# Patient Record
Sex: Male | Born: 1975 | Race: White | Hispanic: No | Marital: Single | Smoking: Current every day smoker
Health system: Southern US, Community
[De-identification: ages and names within clinical notes are randomized; demographics above are authoritative.]

## PROBLEM LIST (undated history)

## (undated) ENCOUNTER — Encounter (HOSPITAL_COMMUNITY): Payer: Self-pay

## (undated) ENCOUNTER — Ambulatory Visit (HOSPITAL_COMMUNITY): Payer: Self-pay | Admitting: Otolaryngology

## (undated) ENCOUNTER — Encounter (HOSPITAL_COMMUNITY): Admission: RE | Payer: Self-pay | Source: Ambulatory Visit

## (undated) DIAGNOSIS — S069X9A Unspecified intracranial injury with loss of consciousness of unspecified duration, initial encounter: Secondary | ICD-10-CM

## (undated) DIAGNOSIS — I10 Essential (primary) hypertension: Secondary | ICD-10-CM

## (undated) DIAGNOSIS — E785 Hyperlipidemia, unspecified: Secondary | ICD-10-CM

## (undated) DIAGNOSIS — E119 Type 2 diabetes mellitus without complications: Secondary | ICD-10-CM

## (undated) DIAGNOSIS — K219 Gastro-esophageal reflux disease without esophagitis: Secondary | ICD-10-CM

## (undated) DIAGNOSIS — T148XXD Other injury of unspecified body region, subsequent encounter: Secondary | ICD-10-CM

## (undated) DIAGNOSIS — T148XXA Other injury of unspecified body region, initial encounter: Secondary | ICD-10-CM

## (undated) DIAGNOSIS — B1920 Unspecified viral hepatitis C without hepatic coma: Secondary | ICD-10-CM

## (undated) DIAGNOSIS — F419 Anxiety disorder, unspecified: Secondary | ICD-10-CM

## (undated) DIAGNOSIS — L818 Other specified disorders of pigmentation: Secondary | ICD-10-CM

## (undated) DIAGNOSIS — Z720 Tobacco use: Secondary | ICD-10-CM

## (undated) DIAGNOSIS — D649 Anemia, unspecified: Secondary | ICD-10-CM

## (undated) DIAGNOSIS — E669 Obesity, unspecified: Secondary | ICD-10-CM

## (undated) DIAGNOSIS — M109 Gout, unspecified: Secondary | ICD-10-CM

## (undated) HISTORY — PX: HX UPPER ENDOSCOPY: 2100001144

## (undated) HISTORY — PX: HX TONSILLECTOMY: SHX27

## (undated) HISTORY — PX: HX OTHER: 2100001105

## (undated) HISTORY — PX: ANKLE FRACTURE SURGERY: SHX122

## (undated) HISTORY — PX: TONSILLECTOMY: SUR1361

## (undated) HISTORY — PX: NASAL SEPTUM SURGERY: SHX37

## (undated) HISTORY — PX: APPENDECTOMY: SHX54

## (undated) SURGERY — PLACEMENT SPLINT NASAL
Anesthesia: General | Site: Nose | Laterality: Bilateral

---

## 2003-03-09 ENCOUNTER — Emergency Department (HOSPITAL_COMMUNITY): Payer: Self-pay

## 2013-01-06 ENCOUNTER — Ambulatory Visit (HOSPITAL_BASED_OUTPATIENT_CLINIC_OR_DEPARTMENT_OTHER)
Admission: RE | Admit: 2013-01-06 | Discharge: 2013-01-06 | Disposition: A | Discharge status: Home / Self Care | Payer: MEDICAID | Source: Ambulatory Visit | Attending: Nurse Practitioner-BA | Admitting: Nurse Practitioner-BA

## 2013-01-06 ENCOUNTER — Ambulatory Visit (HOSPITAL_BASED_OUTPATIENT_CLINIC_OR_DEPARTMENT_OTHER): Payer: MEDICAID | Admitting: Otolaryngology

## 2013-01-06 ENCOUNTER — Encounter (INDEPENDENT_AMBULATORY_CARE_PROVIDER_SITE_OTHER): Payer: Self-pay | Admitting: Otolaryngology

## 2013-01-06 ENCOUNTER — Encounter (HOSPITAL_COMMUNITY): Payer: Self-pay

## 2013-01-06 ENCOUNTER — Ambulatory Visit
Admission: RE | Admit: 2013-01-06 | Discharge: 2013-01-06 | Disposition: A | Discharge status: Home / Self Care | Payer: MEDICAID | Source: Ambulatory Visit | Attending: Otolaryngology | Admitting: Otolaryngology

## 2013-01-06 VITALS — BP 110/80 | Temp 98.6°F | Ht 74.0 in | Wt 272.9 lb

## 2013-01-06 VITALS — BP 122/77 | HR 98 | Temp 97.3°F | Resp 18 | Ht 74.02 in | Wt 274.5 lb

## 2013-01-06 DIAGNOSIS — F17200 Nicotine dependence, unspecified, uncomplicated: Secondary | ICD-10-CM | POA: Insufficient documentation

## 2013-01-06 DIAGNOSIS — J3489 Other specified disorders of nose and nasal sinuses: Secondary | ICD-10-CM

## 2013-01-06 DIAGNOSIS — B1920 Unspecified viral hepatitis C without hepatic coma: Secondary | ICD-10-CM | POA: Insufficient documentation

## 2013-01-06 HISTORY — DX: Other injury of unspecified body region, initial encounter: T14.8XXA

## 2013-01-06 HISTORY — DX: Unspecified viral hepatitis C without hepatic coma: B19.20

## 2013-01-06 HISTORY — DX: Gastro-esophageal reflux disease without esophagitis: K21.9

## 2013-01-06 HISTORY — DX: Other specified disorders of pigmentation: L81.8

## 2013-01-06 HISTORY — DX: Anxiety disorder, unspecified: F41.9

## 2013-01-06 HISTORY — DX: Obesity, unspecified: E66.9

## 2013-01-06 HISTORY — DX: Essential (primary) hypertension: I10

## 2013-01-06 LAB — BASIC METABOLIC PANEL
ANION GAP: 5 mmol/L (ref 5–16)
BUN/CREAT RATIO: 10 (ref 6–22)
BUN: 15 mg/dL (ref 8–26)
CALCIUM: 8.8 mg/dL (ref 8.5–10.4)
CARBON DIOXIDE: 25 mmol/L (ref 23–33)
CHLORIDE: 99 mmol/L (ref 96–111)
CREATININE: 1.45 mg/dL — ABNORMAL HIGH (ref 0.62–1.27)
ESTIMATED GLOMERULAR FILTRATION RATE: 55 mL/min/{1.73_m2} — ABNORMAL LOW (ref >59)
GLUCOSE,NONFAST: 95 mg/dL (ref 65–139)
POTASSIUM: 4.3 mmol/L (ref 3.5–5.1)
SODIUM: 132 mmol/L — ABNORMAL LOW (ref 136–145)

## 2013-01-06 LAB — PERFORM POC WHOLE BLOOD GLUCOSE: GLUCOSE, POINT OF CARE: 150 mg/dL — ABNORMAL HIGH (ref 70–105)

## 2013-01-06 LAB — CBC
HCT: 35.8 % — ABNORMAL LOW (ref 36.7–47.0)
HGB: 12.3 g/dL — ABNORMAL LOW (ref 12.5–16.3)
MCH: 34.4 pg — ABNORMAL HIGH (ref 27.4–33.0)
MCHC: 34.5 g/dL (ref 32.5–35.8)
MCV: 99.8 fL (ref 78–100)
MPV: 8.4 fL (ref 7.5–11.5)
PLATELET COUNT: 242 THOU/uL (ref 140–450)
RBC: 3.58 MIL/uL — ABNORMAL LOW (ref 4.06–5.63)
RDW: 14 % (ref 12.0–15.0)
WBC: 4.3 10*3/uL (ref 3.5–11.0)

## 2013-01-06 NOTE — Anesthesia Preprocedure Evaluation (Addendum)
Airway       Mallampati: I    TM distance: >3 FB    Neck ROM: full  Mouth Opening: good.  No Facial hair  No Beard  No endotracheal tube present  No Tracheostomy present    Dental           (+) poor dentition           Pulmonary    Breath sounds clear to auscultation  (-) no rhonchi, no decreased breath sounds, no wheezes and no rales     Cardiovascular    Rhythm: regular  Rate: Normal  (-) no peripheral edema and no murmur     Other findings                Planned anesthesia type: general  ASA 3        Patient's NPO status is appropriate for Anesthesia.      Anesthetic plan and risks discussed with patient.    Anesthesia issues/risks discussed are: PONV, Post-op Pain Management, Dental Injuries and Post-op Agitation/Tantrum.          Plan discussed with CRNA.                      EKG Ordered:  01/06/2013   CXR Ordered:  01/06/2013   Other Studies: 01/06/2013  Lab results from outside facility     Consults: None    STOP BANG Score (0-8): 3    Patient instructed to take the following medications day of surgery,  Dexilant, Zantac, Neurontin, Ativan, Incivek, Ribarinin,.

## 2013-01-08 NOTE — H&P (Addendum)
 [facility]                              DEPARTMENT OF [place], [place] [address]                                PATIENT NAME: Juan George, Juan George [facility] [id number]  DATE OF SERVICE:01/06/2013  DATE OF BIRTH: [birth year]    HISTORY AND PHYSICAL    PRIMARY CARE PROVIDER:  Salomon Rosella, Juan George.    HISTORY OF PRESENT ILLNESS:  This is a 37 year old male who presents for further evaluation of a possible septal perforation.  He states that 6-8 years ago he was a heavy cocaine user.  He did go on a binge for about 2-3 months at that time.  Since then, he has had a sore spot in the left side of his nose.  He states that crusting would continue to build up on there.  He would take it out, and the ulcer would be irritated and bleeding somewhat.  He has had no whistling.  He did not have health insurance and subsequently could not see a specialist until he got Medicaid.  He presents here for further evaluation of this septal perforation/ulcer.  Of note, the patient does have hepatitis C.    Past Medical History   Diagnosis Date   . Hypertension    . Anxiety    . GERD (gastroesophageal reflux disease)      Well controlled with medication   . Viral hepatitis C      Tests negative, but still on medications   . Obesity    . Piercing      ears   . Decorative tattoo      several over body       PAST SURGICAL HISTORY:    Past Surgical History   Procedure Laterality Date   . Hx upper endoscopy     . Hx other       Right ankle ORIF, hardware removed   . Hx tonsillectomy          FAMILY HISTORY:   Family History   Problem Relation Age of Onset   . Anesth Problems Neg Hx    . Bleeding Prob Neg Hx    . Cancer Neg Hx    . Diabetes Neg Hx    . Heart Disease Neg Hx    . Hypertension Neg Hx    . Other Neg Hx        SOCIAL HISTORY:    History   Substance Use Topics   . Smoking status: Current Every Day Smoker -- 0.50 packs/day for 20 years   .  Smokeless tobacco: Never Used   . Alcohol Use: No       MEDICATIONS:    Current Outpatient Prescriptions   Medication Sig   . DEXLANSOPRAZOLE (DEXILANT ORAL) Take by mouth   . Gabapentin (NEURONTIN) 600 mg Oral Tablet Take 600 mg by mouth   . INTERFERON ALFA-N3 INJ by Injection route Every 7 days   . Lisinopril -Hydrochlorothiazide  (ZESTORETIC) 20-25 mg Oral Tablet Take 1 Tab by mouth Once a day   . LORAZEPAM (ATIVAN ORAL) Take by  mouth   . RANITIDINE HCL (ACID REDUCER, RANITIDINE, ORAL) Take by mouth   . RIBAVIRIN ORAL Take by mouth Twice daily   . telaprevir (INCIVEK) 375 mg Oral Tablet Take 1,125 mg by mouth Twice daily   . TRAMADOL  HCL (TRAMADOL  ORAL) Take by mouth       ALLERGIES:  No Known Allergies    REVIEW OF SYSTEMS:  Denies fevers or chills.  All other systems were reviewed and were found to be negative.      PHYSICAL EXAMINATION:  Vital signs:  Blood pressure is 110/80, temperature 98.6 degrees Fahrenheit, weight 123.8 kg, height 188.0 cm.  General appearance:  Pleasant, cooperative, healthy and in no acute distress.  Eyes:  Conjunctivae/corneas clear, PERRLA, EOMs intact.  Head and face:  Normocephalic, atraumatic.  Face symmetric, no obvious lesions.   Pinnae:  Normal shape and position.   External auditory canals:  Patent without inflammation.  Tympanic membranes:  Intact, translucent, midposition, middle ear aerated.  Nose:  External pyramid is midline.  When looking inside of the nose on anterior rhinoscopy, there is a large left septal ulcer with crusting.  There is no mucosa on that side, and there is no cartilage on that side.  However, it has not gone through-and-through, and there is still mucosa on the right side of the septum.  There is a right septal deviation and a right inferior septal spur.  Oral cavity/oropharynx:  No mucosal lesions, masses or pharyngeal asymmetry.  Neck:  No palpable thyroid , salivary gland or neck masses.  Hem/lymph:  No cervical adenopathy.  Cardiovascular:  Good  perfusion of upper extremities. No cyanosis of the hands or fingers.  Lungs:  No apparent stridulous breathing.  In no acute distress.  Skin:  Warm and dry.  Neurologic:  Cranial nerves grossly intact.  Psychiatric:  Alert and oriented x 3.    ASSESSMENT:  1.  Previous cocaine user.  2.  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    PLAN:  1.  Will schedule for an open rhinoplasty, repair of septal perforation and possible ear cartilage graft.  2.  He is to see his doctor to see if he can undergo surgery even with his concurrent hepatitis C treatment.  3.  The patient understands the risks and benefits of the procedure and wishes to proceed.  Consent is signed at this visit.  4.  Will follow up at the time of surgery.  5.  Outside records are scanned into [facility].      Juan SHAUNNA Buoy, Juan George  Resident  [place] Department of Otolaryngology    Juan Liverpool, Juan George  Professor  [facility] Department of Otolaryngology    [id number]; D: 01/08/2013 10:05:25; T: 01/08/2013 10:24:23    cc: Juan Rosella Juan George      [facility] [address] [place], [place] [address]    I saw and examined the patient.  I reviewed the resident's note.  I agree with the findings and plan of care as documented in the resident's note.  Any exceptions/additions are edited/noted.    Juan Liverpool, Juan George 01/09/2013, 8:21 PM

## 2013-02-15 ENCOUNTER — Encounter (HOSPITAL_COMMUNITY): Payer: Self-pay

## 2013-02-15 ENCOUNTER — Ambulatory Visit (HOSPITAL_BASED_OUTPATIENT_CLINIC_OR_DEPARTMENT_OTHER): Payer: MEDICAID | Admitting: Otolaryngology

## 2013-02-15 ENCOUNTER — Ambulatory Visit (HOSPITAL_BASED_OUTPATIENT_CLINIC_OR_DEPARTMENT_OTHER): Payer: MEDICAID | Admitting: Anesthesiology

## 2013-02-15 ENCOUNTER — Inpatient Hospital Stay
Admission: RE | Admit: 2013-02-15 | Discharge: 2013-02-15 | Disposition: A | Discharge status: Home / Self Care | Payer: MEDICAID | Source: Ambulatory Visit | Attending: Otolaryngology | Admitting: Otolaryngology

## 2013-02-15 ENCOUNTER — Encounter (HOSPITAL_COMMUNITY): Payer: Self-pay | Admitting: Nurse Practitioner-BA

## 2013-02-15 ENCOUNTER — Encounter (HOSPITAL_COMMUNITY)
Admission: RE | Disposition: A | Discharge status: Home / Self Care | Payer: Self-pay | Source: Ambulatory Visit | Attending: Otolaryngology

## 2013-02-15 DIAGNOSIS — J3489 Other specified disorders of nose and nasal sinuses: Secondary | ICD-10-CM

## 2013-02-15 HISTORY — PX: PB REPAIR NASAL SEPTUM PERFORATN: 30630

## 2013-02-15 HISTORY — PX: PB EAR CARTILAGE GRAFT TO FACE: 21235

## 2013-02-15 SURGERY — REPAIR SEPTAL PERFORATION
Anesthesia: General | Site: Ear | Wound class: Clean Contaminated Wounds-The respiratory, GI, Genital, or urinary

## 2013-02-15 MED ORDER — LACTATED RINGERS INTRAVENOUS SOLUTION
INTRAVENOUS | Status: DC | PRN
Start: 2013-02-15 — End: 2013-02-15

## 2013-02-15 MED ORDER — LIDOCAINE (PF) 100 MG/5 ML (2 %) INTRAVENOUS SYRINGE
INJECTION | Freq: Once | INTRAVENOUS | Status: DC | PRN
Start: 2013-02-15 — End: 2013-02-15
  Administered 2013-02-15: 80 mg via INTRAVENOUS
  Administered 2013-02-15: 100 mg via INTRAVENOUS

## 2013-02-15 MED ORDER — PHENYLEPHRINE 120 MCG/ML IV DILUTION
Freq: Once | INTRAMUSCULAR | Status: DC | PRN
Start: 2013-02-15 — End: 2013-02-15
  Administered 2013-02-15: 100 ug via INTRAVENOUS
  Administered 2013-02-15: 50 ug via INTRAVENOUS
  Administered 2013-02-15 (×2): 100 ug via INTRAVENOUS

## 2013-02-15 MED ORDER — HYDROCODONE 10 MG-ACETAMINOPHEN 325 MG TABLET
1.00 | ORAL_TABLET | ORAL | Status: DC | PRN
Start: 2013-02-15 — End: 2013-03-03

## 2013-02-15 MED ORDER — SODIUM CHLORIDE 0.9 % (FLUSH) INJECTION SYRINGE
2.00 mL | INJECTION | Freq: Three times a day (TID) | INTRAMUSCULAR | Status: DC
Start: 2013-02-15 — End: 2013-02-16

## 2013-02-15 MED ORDER — HYDROMORPHONE 1 MG/ML INJECTION WRAPPER
0.6000 mg | INJECTION | INTRAMUSCULAR | Status: DC | PRN
Start: 2013-02-15 — End: 2013-02-16
  Administered 2013-02-15 (×3): 0.6 mg via INTRAVENOUS
  Administered 2013-02-15: 0.4 mg via INTRAVENOUS
  Administered 2013-02-15 (×3): 0.6 mg via INTRAVENOUS
  Filled 2013-02-15: qty 1
  Filled 2013-02-15 (×2): qty 2

## 2013-02-15 MED ORDER — SODIUM CHLORIDE 0.9 % (FLUSH) INJECTION SYRINGE
2.00 mL | INJECTION | INTRAMUSCULAR | Status: DC | PRN
Start: 2013-02-15 — End: 2013-02-16

## 2013-02-15 MED ORDER — OXYMETAZOLINE 0.05 % NASAL SPRAY
1.0000 | Freq: Once | NASAL | Status: DC | PRN
Start: 2013-02-15 — End: 2013-02-15
  Administered 2013-02-15: 10 mL via TOPICAL
  Filled 2013-02-15: qty 30

## 2013-02-15 MED ORDER — ONDANSETRON HCL (PF) 4 MG/2 ML INJECTION SOLUTION
4.0000 mg | Freq: Once | INTRAMUSCULAR | Status: DC | PRN
Start: 2013-02-15 — End: 2013-02-16
  Administered 2013-02-15: 4 mg via INTRAVENOUS
  Filled 2013-02-15 (×2): qty 2

## 2013-02-15 MED ORDER — HYDROCODONE 10 MG-ACETAMINOPHEN 325 MG TABLET
1.0000 | ORAL_TABLET | ORAL | Status: DC | PRN
Start: 2013-02-15 — End: 2013-02-16
  Filled 2013-02-15 (×2): qty 1

## 2013-02-15 MED ORDER — MIDAZOLAM 1 MG/ML INJECTION SOLUTION
Freq: Once | INTRAMUSCULAR | Status: DC | PRN
Start: 2013-02-15 — End: 2013-02-15
  Administered 2013-02-15: 2 mg via INTRAVENOUS

## 2013-02-15 MED ORDER — PHENYLEPHRINE 10 MG IN NS 100 ML INFUSION - FOR ANES
INTRAVENOUS | Status: DC | PRN
Start: 2013-02-15 — End: 2013-02-15
  Administered 2013-02-15: .1 ug/kg/min via INTRAVENOUS
  Administered 2013-02-15: 0 ug/kg/min via INTRAVENOUS

## 2013-02-15 MED ORDER — ONDANSETRON HCL (PF) 4 MG/2 ML INJECTION SOLUTION
Freq: Once | INTRAMUSCULAR | Status: DC | PRN
Start: 2013-02-15 — End: 2013-02-15
  Administered 2013-02-15: 4 mg via INTRAVENOUS

## 2013-02-15 MED ORDER — PROPOFOL 10 MG/ML IV BOLUS
INJECTION | Freq: Once | INTRAVENOUS | Status: DC | PRN
Start: 2013-02-15 — End: 2013-02-15
  Administered 2013-02-15: 200 mg via INTRAVENOUS

## 2013-02-15 MED ORDER — DEXAMETHASONE SODIUM PHOSPHATE 4 MG/ML INJECTION SOLUTION
Freq: Once | INTRAMUSCULAR | Status: DC | PRN
Start: 2013-02-15 — End: 2013-02-15
  Administered 2013-02-15: 10 mg via INTRAVENOUS

## 2013-02-15 MED ORDER — NEOMYCIN-POLYMYXIN-PRAMOXINE 3.5 MG-10,000 UNIT-10 MG/GRAM TOP CREAM
TOPICAL_CREAM | Freq: Once | CUTANEOUS | Status: DC | PRN
Start: 2013-02-15 — End: 2013-02-15
  Filled 2013-02-15: qty 15

## 2013-02-15 MED ORDER — LIDOCAINE 1 %-EPINEPHRINE 1:100,000 INJECTION SOLUTION
5.0000 mL | Freq: Once | INTRAMUSCULAR | Status: DC | PRN
Start: 2013-02-15 — End: 2013-02-15
  Administered 2013-02-15: 14.5 mL via INTRAMUSCULAR
  Administered 2013-02-15: 2.5 mL via INTRAMUSCULAR
  Filled 2013-02-15: qty 5

## 2013-02-15 MED ORDER — ROCURONIUM 10 MG/ML INTRAVENOUS SOLUTION
Freq: Once | INTRAVENOUS | Status: DC | PRN
Start: 2013-02-15 — End: 2013-02-15
  Administered 2013-02-15: 50 mg via INTRAVENOUS

## 2013-02-15 MED ORDER — LACTATED RINGERS INTRAVENOUS SOLUTION
INTRAVENOUS | Status: DC
Start: 2013-02-15 — End: 2013-02-16
  Administered 2013-02-15: 0 via INTRAVENOUS

## 2013-02-15 MED ORDER — ALBUTEROL SULFATE 2.5 MG/3 ML (0.083 %) SOLUTION FOR NEBULIZATION
2.5000 mg | INHALATION_SOLUTION | Freq: Once | RESPIRATORY_TRACT | Status: DC | PRN
Start: 2013-02-15 — End: 2013-02-16
  Filled 2013-02-15: qty 3

## 2013-02-15 MED ORDER — CEFAZOLIN 1 GRAM SOLUTION FOR INJECTION
Freq: Once | INTRAMUSCULAR | Status: DC | PRN
Start: 2013-02-15 — End: 2013-02-15
  Administered 2013-02-15: 2000 mg via INTRAVENOUS

## 2013-02-15 MED ORDER — SODIUM CHLORIDE 0.9 % INTRAVENOUS SOLUTION
12.5000 mg | Freq: Once | INTRAVENOUS | Status: DC | PRN
Start: 2013-02-15 — End: 2013-02-16

## 2013-02-15 MED ORDER — CEPHALEXIN 500 MG CAPSULE
500.00 mg | ORAL_CAPSULE | Freq: Four times a day (QID) | ORAL | Status: DC
Start: 2013-02-15 — End: 2013-03-03

## 2013-02-15 MED ORDER — HYDROMORPHONE 1 MG/ML INJECTION WRAPPER
INJECTION | Freq: Once | INTRAMUSCULAR | Status: DC | PRN
Start: 2013-02-15 — End: 2013-02-15
  Administered 2013-02-15: .8 mg via INTRAVENOUS
  Administered 2013-02-15: 0.2 mg via INTRAVENOUS

## 2013-02-15 MED ORDER — FENTANYL (PF) 50 MCG/ML INJECTION SOLUTION
Freq: Once | INTRAMUSCULAR | Status: DC | PRN
Start: 2013-02-15 — End: 2013-02-15
  Administered 2013-02-15: 200 ug via INTRAVENOUS

## 2013-02-15 MED ADMIN — HYDROcodone 10 mg-acetaminophen 325 mg tablet: 1 | ORAL | NDC 00406036723

## 2013-02-15 SURGICAL SUPPLY — 45 items
ADH LIQUID LF  WTPRF VIAL PREP NONSTAIN MASTISOL STYRAX GUM MASTIC ALC MTHY SLCYT STRL CLR NHZR 2/3 (SEALANTS) ×2 IMPLANT
BALL COT 1/8IN 100/BX 3007 (PREP) ×2 IMPLANT
BANDAGE KERLIX 4.1YDX4.5IN 6 PLY ABS CRNKL WV LINT FREE COTTON LRG GAUZE STRL LF  DISP (WOUND CARE SUPPLY) ×4 IMPLANT
BLADE OPTH GRN BEAVER MN BLD GRINDLESS 1 SD SHARP PREASSEMBLE RND TIP STRL LF (SURGICAL CUTTING SUPPLIES) ×2 IMPLANT
CONV USE 338646 - PACK SURG EAR NONST DISP LF (CUSTOM TRAYS & PACK) ×2 IMPLANT
CORD BIPOLAR FOOTSWITCH 12FT E0509 50EA/CS STRL DISP (CAUTERY SUPPLIES) ×2 IMPLANT
DISCONTINUED USE 330324 - NEEDLE HYPO 22GA 1.5IN REG WL_SFGLD POLYPROP REG BVL LL HUB (NEEDLES & SYRINGE SUPPLIES) ×2 IMPLANT
DISCONTINUED USE ITEM 35894 - DRAPE MAG 16X10IN DVN FOAM FLXB 121 STRL LF  DISP (EQUIPMENT MINOR) ×2 IMPLANT
DISCONTINUED USE ITEM 61822 - SUTURE 4-0 TF VICRYL 27IN UNDYED BRD COAT ABS (SUTURE/WOUND CLOSURE) IMPLANT
DISCONTINUED USE ITEM 91379 - SUTURE 5-0 RB1 VICRYL 27IN UNDYED BRD COAT ABS (SUTURE/WOUND CLOSURE) IMPLANT
DONUT EXTREMITY CUSHIONING 31143137 (POSITIONING PRODUCTS) ×2 IMPLANT
DRAPE LEICA MICSCP 150X54IN EQ_P (DRAPE/PACKS/SHEETS/OR TOWEL) ×2 IMPLANT
DRAPE MAG 16X10IN DVN FOAM FLX_B 121 STRL LF DISP (EQUIPMENT MINOR) ×1
DRAPE ZS MD LEICA UNIV HSNG 120X48IN STRL EQP (DRAPE/PACKS/SHEETS/OR TOWEL) ×2 IMPLANT
DRAPE ZS MD LEICA UNIV HSNG 12_0X48IN STRL EQP (DRAPE/PACKS/SHEETS/OR TOWEL) ×1
DRESS EAR 5.5IN GLSCK ADULT LF (WOUND CARE SUPPLY) ×2 IMPLANT
DUPE USE ITEM 319445 - SUTURE CHR GUT 3-0 KS 27IN MON_OF ABS (SUTURE/WOUND CLOSURE) ×2 IMPLANT
KIT RM TURNOVER CLEANOP CSTM INFCT CONTROL (KITS & TRAYS (DISPOSABLE)) ×2
KIT RM TURNOVER CLEANOP CUSTOM INFCT CONTROL (KITS & TRAYS (DISPOSABLE)) ×2 IMPLANT
LABEL MED EZ PEEL MRKR LF (LABELS/CHART SUPPLIES) ×2 IMPLANT
NEEDLE SPINAL BLU 3IN 25GA QUINCKE STD LGTH REG WL POLYPROP STRL LF  DISP (NEEDLES & SYRINGE SUPPLIES) ×2 IMPLANT
PACK SURG ECLIPSE EENT II TBL CVR SUT BAG HEAD TRBN DRP 90X50IN 40X27IN LF (DRAPE/PACKS/SHEETS/OR TOWEL) ×2 IMPLANT
PAD ARMBOARD FOAM BLU_FP-ECARM (POSITIONING PRODUCTS) ×1
PAD ARMBRD BLU (POSITIONING PRODUCTS) ×2 IMPLANT
PAD RELEASE 3 X 4 IN_1050 50/BX (WOUND CARE SUPPLY) ×4 IMPLANT
PROBE EMG MONOPOL BNA PLUG DISP (CAUTERY SUPPLIES) ×2 IMPLANT
SIMPLESPLINTS FLUROPLASTIC ×2 IMPLANT
SPLINT NASAL DOYLE WHT (ENT SUPPLIES) ×2 IMPLANT
SPONGE SUPER GAUZE 6 X 6 3/4IN 7310 10EA/TU 48TU/CS (WOUND CARE SUPPLY) ×2 IMPLANT
SPONGE SURG 4X4IN 16 PLY RADOPQ BAND VISTEC STRL LF  BLU WHT (WOUND CARE SUPPLY) ×2 IMPLANT
STRIP SURG XRAY DTBL 1/2 X 6IN_80-1451 (WOUND CARE SUPPLY) ×2 IMPLANT
SUTURE 2-0 KS PROLENE 30IN BLU MONOF NONAB (SUTURE/WOUND CLOSURE) IMPLANT
SUTURE 4-0 TF VICRYL 27IN UNDYED BRD COAT ABS (SUTURE/WOUND CLOSURE)
SUTURE CHR 4-0 RB1 27IN BRN MONOF ABS (SUTURE/WOUND CLOSURE) IMPLANT
SUTURE PLAIN 5-0 PC-1 MTPS 18IN TAN MONOF ABS LF (SUTURE/WOUND CLOSURE) IMPLANT
SUTURE PLAIN GUT 4-0 SC-1 18IN TAN MONOF ABS (SUTURE/WOUND CLOSURE) IMPLANT
SUTURE PLN GUT 4-0 SC-1 18IN T_AN MONOF ABS (SUTURE/WOUND CLOSURE)
SUTURE SILK 2-0 FS PERMAHAND 18IN BLK BRD NONAB (SUTURE/WOUND CLOSURE) IMPLANT
SUTURE SILK 2-0 FS PERMAHAND 1_8IN BLK BRD NONAB (SUTURE/WOUND CLOSURE)
SUTURE SILK 2-0 KS PERMAHAND 30IN BLK BRD NONAB (SUTURE/WOUND CLOSURE) IMPLANT
SUTURE SILK 3-0 FS1 PERMAHAND 18IN BLK BRD NONAB (SUTURE/WOUND CLOSURE) IMPLANT
SWAB BD BBL BD CLTSWB LIQUID STRT MED AL RYN TMPR EVD SEAL 2 ACT CAP RND BTM TUBE REG WRE 5.25IN (MICR) ×2 IMPLANT
TRAY SKIN SCRUB 8IN VNYL COTTON 6 WNG 6 SPONGE STICK 2 TIP APPL DRY STRL LF (KITS & TRAYS (DISPOSABLE)) ×2 IMPLANT
TUBING SUCT CLR 20FT 9/32IN MEDIVAC NCDTV M/M CONN STRL LF (Suction) ×2 IMPLANT
WIPE 3X3IN LF  ULTRACELL SURG INSTR SOLAN THK2MM (CLEN) ×2 IMPLANT

## 2013-02-15 NOTE — H&P (Addendum)
[facility]  DEPARTMENT OF OTOLARYNGOLOGY - HEAD AND NECK SURGERY  H&P UPDATE    Date: 02/15/2013  Name: Juan George, 37 y.o. male  MRN: [id number]  DOB: 04-13-1976    STOP: IF H&P IS GREATER THAN 30 DAYS FROM SURGICAL DAY COMPLETE NEW H&P IS REQUIRED.    Pre-Surgical H & P updated the day of the procedure.  1.  See full H&P from 01/06/13 for complete details.  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 M seen in clinic for evaluation of a septal perforation/ulcer that he developed secondary to intranasal cocaine use. He quit using cocaine 6 years ago. He has Hepatitis C. He was thought to be a good candidate for repair of his septal perforation/ulcer via an open rhinoplasty approach, possible ear cartilage graft.    Past Medical History/Surgical History/Family History/Social History/Review of Systems = No changes.  Change in Medications: No  No Known Allergies  Physical Examination: No acute distress.  No respiratory distress no stridor.  No cyanosis of the upper extremities.  No epistaxis.  No otorrhea.  Moves all extremities.  Assessment: 37 yo M with septal perforation/ulcer.  Plan: To OR for open rhinoplasty approach to repair of septal perforation/ulcer.    2.  Patient continues to be appropiate candidate for planned surgical procedure. YES    Jessy Oto, M.D. 02/15/2013, 10:06 AM   Resident, PGY-2  [place] Department of Otolaryngology - Head and Neck Surgery  Pager: [phone]    I saw and examined the patient.  I reviewed the resident's note.  I agree with the findings and plan of care as documented in the resident's note.  Any exceptions/additions are edited/noted.    Alford Highland, MD 02/15/2013, 1:17 PM

## 2013-02-15 NOTE — Anesthesia Postprocedure Evaluation (Signed)
 ANESTHESIA POSTOP EVALUATION NOTE        Anesthesia Service      [place]  [facility]     02/15/2013     Last Vitals: Temperature: 36.2 C (97.2 F) (02/15/13 0858)  Heart Rate: 96 (02/15/13 0858)  BP (Non-Invasive): 106/60 mmHg (02/15/13 0858)  Respiratory Rate: 18 (02/15/13 0858)  SpO2-1: 100 % (02/15/13 0858)    Procedure(s):  REPAIR SEPTAL PERFORATION  PLACEMENT GRAFT AURICULAR CARTILAGE    Patient is sufficiently recovered from the effects of anesthesia to participate in the evaluation and has returned to their pre-procedure level.  I have reviewed and evaluated the following:  Respiratory Function: Consistent with pre anesthetic level  Cardiovascular Function: Consistent with pre anesthetic level  Mental Status: Return to pre anesthetic baseline level  Pain: Sufficiently controlled with medication  Nausea and Vomiting: Absent or sufficiently controlled with medication  Post-op Anesthetic Complications: None    Comment/ re-evaluation for any variations: None

## 2013-02-15 NOTE — Discharge Instructions (Signed)
SURGICAL DISCHARGE INSTRUCTIONS     Dr. Alford Highland, MD  performed your REPAIR SEPTAL PERFORATION, PLACEMENT GRAFT AURICULAR CARTILAGE today at the [facility]    [facility]:  Monday through Friday from 6 a.m. - 7 p.m.: [phone]  Between 7 p.m. - 6 a.m., weekends and holidays:  Call [facility] at [phone] or [phone].      SIGNS AND SYMPTOMS OF A WOUND / INCISION INFECTION   Be sure to watch for the following:   Increase in redness or red streaks near or around the wound or incision.   Increase in pain that is intense or severe and cannot be relieved by the pain medication that your doctor has given you.   Increase in swelling that cannot be relieved by elevation of a body part, or by applying ice, if permitted.   Increase in drainage, or if yellow / green in color and smells bad. This could be on a dressing or a cast.   Increase in fever for longer than 24 hours, or an increase that is higher than 101 degrees Fahrenheit (normal body temperature is 98 degrees Fahrenheit). The incision may feel warm to the touch.    **CALL YOUR DOCTOR IF ONE OR MORE OF THESE SIGNS / SYMPTOMS SHOULD OCCUR.    ANESTHESIA INFORMATION   ANESTHESIA -- ADULT PATIENTS:  You have received intravenous sedation / general anesthesia, and you may feel drowsy and light-headed for several hours. You may even experience some forgetfulness of the procedure. DO NOT DRIVE A MOTOR VEHICLE or perform any activity requiring complete alertness or coordination until you feel fully awake in about 24-48 hours. Do not drink alcoholic beverages for at least 24 hours. Do not stay alone, you must have a responsible adult available to be with you. You may also experience a dry mouth or nausea for 24 hours. This is a normal side effect and will disappear as the effects of the medication wear off.    REMEMBER   If you experience any difficulty breathing, chest pain, bleeding that you feel is excessive,  persistent nausea or vomiting or for any other concerns:  Call your physician Dr. Aundria Rud at [phone] or [phone]. You may also ask to have the ENT doctor on call paged. They are available to you 24 hours a day.    SPECIAL INSTRUCTIONS / COMMENTS       FOLLOW-UP APPOINTMENTS   Please call patient services at [phone] or [phone] to schedule a date / time of return. They are open Monday - Friday from 7:30 am - 5:00 pm.

## 2013-02-15 NOTE — Nurses Notes (Signed)
Pt. Discharged to home via wheelchair with girlfriend escorted by [employer], PCA.

## 2013-02-15 NOTE — Nurses Notes (Signed)
After Visit Summary (AVS) and education hand-outs were given to pt. And pt. girlfriend after instructional information was reviewed.  Pt and pt.girlfriend verbalized understanding and both able to correctly repeat discharge instructions.

## 2013-02-15 NOTE — OR PostOp (Signed)
Reviewed allergies and history with A. Lorenze, CRNA.

## 2013-02-15 NOTE — OR Surgeon (Addendum)
 [place]  [facility]   OTOLARYNGOLOGY DEPARTMENT   BRIEF OPERATIVE NOTE    Name: Juan George, 37 y.o. male  MRN: [id number]  DOB: August 22, 1976  Date of Surgery: 02/15/2013    Preoperative Diagnoses: nasal septal perforation  Postoperative Diagnoses: same  Procedure: Septal perforation repair via open rhinoplasty approach, R conchal cartilage graft harvest  Surgeons: Dr. Jude, Dr. Reida  Anesthesia: GETA  Estimated Blood Loss: minimal  Fluids: see anesthesia note for totals  Operative Findings: approximately 2cm L septal ulceration  Drains: none  Specimens: none  Implants: none  Complications: none  Condition: stable  Disposition: PACU, then D/C to home    [facility] Jon Reida, MD 02/15/2013, 1:07 PM    I was present for the key portion of the procedure and I was immediately available for all other aspects of the case.  Tanda Jude, MD 02/15/2013, 1:18 PM    The patient tolerated the procedure well. Patient was discharged when criteria were meet.  Tanda Jude, MD 02/15/2013, 1:18 PM

## 2013-02-15 NOTE — Anesthesia Transfer of Care (Signed)
ANESTHESIA TRANSFER OF CARE NOTE        Anesthesia Service      [facility]         Last Vitals: Temperature: 36.4 C (97.5 F) (02/15/13 1318)  Heart Rate: 106 (02/15/13 1318)  BP (Non-Invasive): 126/59 mmHg (02/15/13 1318)  Respiratory Rate: 17 (02/15/13 1318)  SpO2-1: 98 % (02/15/13 1318)    Patient transferred to pacu in stable condition. Report given to RN.    3/4/2014at 1:19 PM.

## 2013-02-16 ENCOUNTER — Ambulatory Visit: Payer: MEDICAID | Attending: Otolaryngology | Admitting: Otolaryngology

## 2013-02-16 ENCOUNTER — Encounter (HOSPITAL_COMMUNITY): Payer: Self-pay | Admitting: Otolaryngology

## 2013-02-16 VITALS — BP 124/68 | HR 80 | Temp 97.2°F | Ht 74.0 in | Wt 268.5 lb

## 2013-02-16 DIAGNOSIS — Z9889 Other specified postprocedural states: Secondary | ICD-10-CM

## 2013-02-16 DIAGNOSIS — J3489 Other specified disorders of nose and nasal sinuses: Secondary | ICD-10-CM

## 2013-02-16 NOTE — OR Surgeon (Signed)
[facility]                                  DEPARTMENT OF OTOLARYNGOLOGY                                     OPERATION SUMMARY    PATIENT NAME: Juan George, Juan George [facility] [id number]:[id number]  DATE OF SERVICE:02/15/2013  DATE OF BIRTH: December 04, 1976    PREOPERATIVE DIAGNOSIS:  Left nasal septal ulceration.    POSTOPERATIVE DIAGNOSIS:  Left nasal septal ulceration.    NAME OF PROCEDURES:  1.  Septal perforation repair via open rhinoplasty approach.  2.  Right conchal cartilage graft harvest.     SURGEONS:  Alford Highland MD (staff), Marylee Floras MD (resident).  I was present for all key and/or critical portions of the case and immediately available at all times.    Alford Highland, MD         ANESTHESIA:  General via endotracheal tube.    OPERATIVE FINDINGS:  A 2-cm left septal perforation involving the mucosa and cartilage.    IMPLANTS:  None.    SPECIMENS:  None.    COMPLICATIONS:  None.    INDICATIONS FOR PROCEDURE:  This is a 37 year old male with a history of cocaine abuse and development of a left septal ulceration.  The patient denied drug use in the recent past.  He was seen by Dr. Aundria Rud, and recommendation was made for repair of the septal ulceration as it was found to involve mucosa and cartilage on the left side.  The patient wished to proceed.     DESCRIPTION OF PROCEDURE:  After patient identification and informed consent were verified in the preoperative holding area, the patient was brought back to the operating room.  Anesthesia was induced, and the patient was intubated without difficulty.  The right postauricular area as well as the nasal cavities were injected with 1% lidocaine with epinephrine 1:100,000.  The nose was packed with Afrin-soaked pledgets.  The face and right ear were prepped with Betadine paint and then draped in sterile fashion.  The procedure began with the right ear.  A postauricular incision was made using a 15 blade.  The incision was carried down to the level of the temporalis fascia.  A small amount of 1% lidocaine with epinephrine was injected just deep to the true temporalis fascia.  An incision was made anteriorly along the edge of the fascia, and a graft measuring approximately 2 x 2 cm was harvested using curved sharp scissors.  Bipolar was used to control bleeding in this area, and then attention was turned to the auricular cartilage.  A tunnel was developed from the postauricular incision to the conchal cartilage.  The superior portion, or the cymba concha, was identified.  Some local was used to infiltrate in a subperichondrial plane on the anterior surface of the cartilage.  The cartilage was harvested from posteriorly.  After using a McCullough elevator to remove all of the overlying perichondrium, a 15 blade and curved sharp scissors were used to harvest a disk of cartilage from the cymba concha area measuring about 2 cm in diameter.  Again, bipolar was used to control bleeding in this area.  The postauricular wound was closed using 4-0 Vicryl deep sutures and a 5-0 fast gut in the skin.  The right  ear was packed with gauze to prevent hematoma, and attention was then turned to the nose.  A gull-wing incision had been marked and previously injected with 1% lidocaine with epinephrine 1:100,000.  A 15 blade was used to  make this incision.  This was carried out bilaterally as a marginal incision on each side.  Curved sharp scissors were used to develop a subcutaneous tunnel just below the dermis and superficial to the lower lateral cartilages on the columella.  This incision was then carried through the dermis and subcutaneous tissues, and the open rhinoplasty approach was performed.  Curved sharp scissors were used to dissect tissue free from the lower lateral cartilages and the dorsum of the nose.  The anterior septal angle was identified, and a mucoperichondrial flap was raised on the left side using a [employer].  A large, approximately 2 x 2-cm, defect was noted in the mucosa as well as the cartilage of the septum.  The mucosal flap on the right side did appear to be intact.  The mucoperichondrial flap was raised circumferentially around the defect.  The cartilage graft was then placed just under the edges of the perforation.  The temporalis fascia graft was trimmed to the appropriate size and then placed over the cartilage graft on the left side of the nose.  Bilateral septal splints were trimmed to the appropriate size and placed in each side of the nose once coated in antibiotic ointment.  They were sutured in place anteriorly to the septum using 3-0 silk.  The skin flap was then returned to its original position and the columellar incision closed using 6-0 Prolene.  The marginal incisions were closed using 4-0 chromic.  The ear was dressed with a Glasscock dressing.  The nasal dorsum was taped with brown tape.  The patient was handed back over to anesthesia.  He was allowed to awaken from anesthesia.  He awoke without difficulty and was transported to PACU in stable condition.  Dr. Aundria Rud was present for the entire case.        Marylee Floras, MD  Resident  [place] Department of Otolaryngology    Alford Highland, MD  Professor  [facility] Department of Otolaryngology     [id number]; D: 02/15/2013 13:20:53; T: 02/15/2013 14:31:20  I was present for all key and/or critical portions of the case and immediately available at all times.    Alford Highland, MD    The patient tolerated the procedure well. Patient was discharged when criteria were meet.  Alford Highland, MD 02/16/2013, 9:01 AM

## 2013-02-17 NOTE — Progress Notes (Signed)
NAME:  Juan George  MRN:  [id number]  DOB:  [birth year]  DOS:  02/17/2013    Subjective  Juan George is a 37 y.o. year old male who presents for Post Op   to clinic.  Yesterday he underwent an open rhinoplasty with repair of a septal ulcer and impending perforation with a cartilage and temporalis graft from the right conchae and temporalis area.  He stated after he went home he sneezed and the fluoroplastic splint came out of his nose and he cut the stitch and pulled it out.  He noted something was attached to the splint.  He called [place] and was asked to come in from [place] for a recheck.  On arrival, it was also noted his ear dressing was off even though he had been asked to leave it in place for 24 hours.  His sutures were noted to be removed from behind the right ear where the graft and cartilage were harvested.  He did not have any bleeding from the nose or post-auricular area.         Medication  Current Outpatient Prescriptions   Medication Sig   . cephALEXin (KEFLEX) 500 mg Oral Capsule Take 1 Cap (500 mg total) by mouth Four times a day   . DEXLANSOPRAZOLE (DEXILANT ORAL) Take by mouth   . Gabapentin (NEURONTIN) 600 mg Oral Tablet Take 600 mg by mouth   . HYDROcodone-acetaminophen (NORCO) 10-325 mg Oral Tablet Take 1 Tab by mouth Every 4 hours as needed   . INTERFERON ALFA-N3 INJ by Injection route Every 7 days   . Lisinopril-Hydrochlorothiazide (ZESTORETIC) 20-25 mg Oral Tablet Take 1 Tab by mouth Once a day   . LORAZEPAM (ATIVAN ORAL) Take by mouth   . RANITIDINE HCL (ACID REDUCER, RANITIDINE, ORAL) Take by mouth   . RIBAVIRIN ORAL Take by mouth Twice daily   . TRAMADOL HCL (TRAMADOL ORAL) Take by mouth       Allergies  Allergies   Allergen Reactions   . Nka (No Known Allergies)        Objective  Filed Vitals:    02/16/13 1345   BP: 124/68   Pulse: 80   Temp: 36.2 C (97.2 F)   Height: 1.88 m (6\' 2" )   Weight: 121.8 kg (268 lb 8.3 oz)      General Appearance: pleasant, cooperative, no distress, healthy, normocephalic, alert and oriented X 3, appears stated age and in no acute distress  Eyes: Conjunctiva clear., Pupils equal and round, reactive to light and accomodation.   Head and Face: Facies symmetric, no obvious lesions.  External Ears:normal pinnae shape and position.  The post auricular incision on the right side had the skin sutures out but the subcutaneous sutures were in place.  The conchael area was flat.  External Auditory Canal - Left: Patent without inflammation.  External Auditory Canal - Right: Patent without inflammation.  Tympanic Membrane - Left: intact, translucent, midposition, middle ear aerated  Tympanic Membrane - Right: intact, translucent, midposition, middle ear aerated  Nose: the septum was deviated slightly to the right.  The cartilage and tissue graft were gone.  No infection was seen.  Oral Cavity/Oropharynx: No mucosal lesions, masses, or pharyngeal asymmetry.  Nasopharynx: deferred.  Hypopharynx/Larynx: Deferred  Neck:: no cervical adenopathy, no palpable thyroid or salivary gland masses  Thyroid: no significant thyroid abnormality by palpation  Skin: Skin warm and dry and Skin color, texture, turgor normal. No rashes or lesions  Neurologic: grossly normal  Assessment:  1. S/P nasal septoplasty      Plan:   The entire surgical procedure was undone with all the grafts missing.  I do not understand how the splints could have been displaced with the silk suture placed through the splints into the columella.  His removal of the postauricular skin sutures was obviously intentional.  I places ome mastisol on the post auricular skin and applied Steristrips.  Of note also was the removal of the 6-0 prolene in the columellar incision.  I placed a Steristrip here as well.  He seems to intentionally have undone all the surgical procedures done yesterday.  There is obviously some psychiatric component to his behavior.  He asked for more pain med which was refused.  He will not return in 2 weeks.  He was given an appointment for 6 weeks if he wants to return.  I told him that was nothing we can do at this point.     Alford Highland MD  Clinical Professor  [facility] Department of Otolaryngology  Head and Neck Surgery

## 2013-02-23 ENCOUNTER — Ambulatory Visit (INDEPENDENT_AMBULATORY_CARE_PROVIDER_SITE_OTHER): Payer: Self-pay | Admitting: Otolaryngology

## 2013-02-23 NOTE — Telephone Encounter (Signed)
Message copied by Caron Presume on Wed Feb 23, 2013 12:35 PM  ------       Message from: Saverio Danker       Created: Wed Feb 23, 2013 11:56 AM         >> Saverio Danker 02/23/2013 11:56 AM       Aundria Rud patient, Patient called, Patient had surgery 02/15/13. Patient's nose is so sore he cannot touch it. Patient has possible infection and is out of medication. Please call patient at above # to discuss. Thank you.   ------

## 2013-02-23 NOTE — Telephone Encounter (Signed)
Patient stating that he is having pain and advised him to take tylenol and rotate with motrin. He is stating that it feels like there is dried mucus in his nose. Advised patient to keep appointment to be seen with Dr. Aundria Rud. No further questions at this time.

## 2013-02-28 ENCOUNTER — Encounter (INDEPENDENT_AMBULATORY_CARE_PROVIDER_SITE_OTHER): Payer: MEDICAID | Admitting: Otolaryngology

## 2013-03-02 ENCOUNTER — Ambulatory Visit (INDEPENDENT_AMBULATORY_CARE_PROVIDER_SITE_OTHER): Payer: Self-pay | Admitting: Otolaryngology

## 2013-03-02 NOTE — Telephone Encounter (Signed)
Patient requesting to be seen to have stiches removed and to have a later appointment due to his transportation issues. Scheduled appointment 03/03/13 (Thu) 2:45 PM 15 min Alford Highland, MD OTOLARYNGOLOGY-[facility], [facility]  No further questions at this time

## 2013-03-02 NOTE — Telephone Encounter (Signed)
Message copied by Caron Presume on Wed Mar 02, 2013  7:59 AM  ------       Message from: Saverio Danker       Created: Tue Mar 01, 2013 11:40 AM         >> Saverio Danker 03/01/2013 11:40 AM       Aundria Rud patient, Patient called, Patient stated his discharge paperwork stated appointment 03/02/13. Patient no-showed 02/28/13 appointment. Patient takes public transportation and cannot be here for appointment sooner than 11:00am. Patient declined first available (need sooner date and later appointment) Can you work patient into your schedule. Please call patient at above # with post op appointment information. Thank you.   ------

## 2013-03-03 ENCOUNTER — Encounter (INDEPENDENT_AMBULATORY_CARE_PROVIDER_SITE_OTHER): Payer: Self-pay | Admitting: Otolaryngology

## 2013-03-03 ENCOUNTER — Ambulatory Visit: Payer: MEDICAID | Attending: Otolaryngology | Admitting: Otolaryngology

## 2013-03-03 VITALS — BP 112/64 | HR 110 | Temp 97.0°F | Ht 74.02 in | Wt 268.3 lb

## 2013-03-03 DIAGNOSIS — Z4802 Encounter for removal of sutures: Secondary | ICD-10-CM | POA: Insufficient documentation

## 2013-03-03 DIAGNOSIS — Z411 Encounter for cosmetic surgery: Secondary | ICD-10-CM

## 2013-03-03 DIAGNOSIS — J3489 Other specified disorders of nose and nasal sinuses: Secondary | ICD-10-CM | POA: Insufficient documentation

## 2013-03-04 NOTE — Progress Notes (Addendum)
[facility]                              DEPARTMENT OF [place], [place] [address]                                PATIENT NAME: Juan George, Juan George [facility] UEAVWU:[id number]  DATE OF SERVICE:03/03/2013  DATE OF BIRTH: 10-24-1976    PROGRESS NOTE    SUBJECTIVE:  Juan George is a 37 year old male who returns to clinic for followup.  On February 16, 2013, he underwent an open rhinoplasty with repair of a septal ulcer and impending perforation with a cartilage and temporalis graft from the right conchae and temporalis area.  At the time he was last seen on February 17, 2013, he had removed the postauricular skin sutures along with the [facility] splints.  He was given an appointment for 6 weeks if he wanted to return and we had a long discussion with him regarding how there was nothing further that could be done at this point to improve his problem as he had undone the surgery that we had performed.  He returns today because he wants a suture in his nose removed.  No other issues.      OBJECTIVE:  On physical examination, the patient is afebrile with stable vital signs.  This is a 37 year old male in no acute distress.  Head is normocephalic.  Skin is warm and dry.  There is a Prolene suture present in the columella, which was removed.  The right posterior auricular incision has healed well.  Intranasal examination reveals a complete through-and-through perforation of the septum.    ASSESSMENT:  A 37 year old male status post open rhinoplasty with repair of septal ulcer and impending perforation with cartilage and temporalis graft from the right concha and temporalis area on February 16, 2013.  The patient removed the splints and the sutures and undid the surgery that we performed.  He returned today for removal of a suture that was remaining, which was easily removed.    PLAN:  He can see [place] back as needed.      Raelyn Mora, MD   Resident  [facility] Department of Otolaryngology    Alford Highland, MD  Professor  [facility] Department of Otolaryngology    GN/[id number]; D: 03/03/2013 15:20:09; T: 03/04/2013 07:33:10    I saw and examined the patient.  I reviewed the resident's note.  I agree with the findings and plan of care as documented in the resident's note.  Any exceptions/additions are edited/noted.    Alford Highland, MD 03/05/2013, 4:38 AM

## 2013-04-06 ENCOUNTER — Encounter (INDEPENDENT_AMBULATORY_CARE_PROVIDER_SITE_OTHER): Payer: MEDICAID | Admitting: Otolaryngology

## 2013-05-02 ENCOUNTER — Ambulatory Visit: Payer: MEDICAID | Attending: Otolaryngology | Admitting: Otolaryngology

## 2013-05-02 ENCOUNTER — Encounter (INDEPENDENT_AMBULATORY_CARE_PROVIDER_SITE_OTHER): Payer: Self-pay | Admitting: Otolaryngology

## 2013-05-02 VITALS — BP 136/82 | HR 86 | Temp 98.0°F | Ht 74.0 in | Wt 261.0 lb

## 2013-05-02 DIAGNOSIS — Z9889 Other specified postprocedural states: Secondary | ICD-10-CM | POA: Insufficient documentation

## 2013-05-02 DIAGNOSIS — J3489 Other specified disorders of nose and nasal sinuses: Secondary | ICD-10-CM | POA: Insufficient documentation

## 2013-06-14 ENCOUNTER — Encounter (HOSPITAL_COMMUNITY): Payer: Self-pay

## 2013-06-20 ENCOUNTER — Encounter (HOSPITAL_COMMUNITY): Payer: Self-pay | Admitting: Anesthesiology

## 2013-06-21 ENCOUNTER — Encounter (HOSPITAL_COMMUNITY): Payer: Self-pay

## 2013-11-18 ENCOUNTER — Ambulatory Visit
Admission: RE | Admit: 2013-11-18 | Discharge: 2013-11-18 | Disposition: A | Discharge status: Home / Self Care | Payer: MEDICAID | Source: Ambulatory Visit

## 2013-11-18 ENCOUNTER — Encounter (HOSPITAL_COMMUNITY): Payer: Self-pay

## 2013-11-21 ENCOUNTER — Ambulatory Visit
Admission: RE | Admit: 2013-11-21 | Discharge: 2013-11-21 | Disposition: A | Discharge status: Home / Self Care | Payer: MEDICAID | Source: Ambulatory Visit

## 2013-11-21 ENCOUNTER — Encounter (HOSPITAL_COMMUNITY): Payer: Self-pay

## 2013-11-21 HISTORY — DX: Other injury of unspecified body region, subsequent encounter: T14.8XXD

## 2013-11-21 HISTORY — DX: Gout, unspecified: M10.9

## 2013-11-21 HISTORY — DX: Tobacco use: Z72.0

## 2013-11-21 HISTORY — DX: Anemia, unspecified: D64.9

## 2013-11-21 NOTE — Anesthesia Preprocedure Evaluation (Addendum)
 Physical Exam:     Airway       Mallampati: II    TM distance: >3 FB    Neck ROM: full  Mouth Opening: good.  No Facial hair  No Beard  No endotracheal tube present  No Tracheostomy present    Dental       Dentition intact             Pulmonary    Breath sounds clear to auscultation  (-) no rhonchi, no decreased breath sounds, no wheezes, no rales and no stridor     Cardiovascular    Rhythm: regular  Rate: Normal  (-) no friction rub, carotid bruit is not present, no peripheral edema and no murmur     Other findings            Anesthesia Plan:  Planned anesthesia type: general  ASA 3       Patient's NPO status is appropriate for Anesthesia.    Anesthetic plan and risks discussed with patient.    Anesthesia issues/risks discussed are: PONV, Dental Injuries, Cardiac Events/MI and Intraoperative Awareness/ Recall.        Plan discussed with CRNA.                    EKG: Not ordered  CXR: Not ordered  Other Studies: None    Consults: None    STOP BANG Score (0-8): Telephone assessment     Patient instructed to take the following medications day of surgery, allopurinol, gabapentin and nuerontin.

## 2013-11-22 ENCOUNTER — Ambulatory Visit (HOSPITAL_BASED_OUTPATIENT_CLINIC_OR_DEPARTMENT_OTHER): Payer: MEDICAID | Admitting: Certified Registered"

## 2013-11-22 ENCOUNTER — Encounter (HOSPITAL_COMMUNITY)
Admission: RE | Disposition: A | Discharge status: Home / Self Care | Payer: Self-pay | Source: Ambulatory Visit | Attending: Otolaryngology

## 2013-11-22 ENCOUNTER — Encounter (HOSPITAL_COMMUNITY): Payer: Self-pay | Admitting: Family

## 2013-11-22 ENCOUNTER — Encounter (HOSPITAL_COMMUNITY): Payer: Self-pay

## 2013-11-22 ENCOUNTER — Ambulatory Visit (HOSPITAL_BASED_OUTPATIENT_CLINIC_OR_DEPARTMENT_OTHER): Payer: MEDICAID | Admitting: Otolaryngology

## 2013-11-22 ENCOUNTER — Inpatient Hospital Stay
Admission: RE | Admit: 2013-11-22 | Discharge: 2013-11-22 | Disposition: A | Discharge status: Home / Self Care | Payer: MEDICAID | Source: Ambulatory Visit | Attending: Otolaryngology | Admitting: Otolaryngology

## 2013-11-22 DIAGNOSIS — J3489 Other specified disorders of nose and nasal sinuses: Secondary | ICD-10-CM

## 2013-11-22 LAB — PERFORM POC WHOLE BLOOD GLUCOSE
GLUCOSE, POINT OF CARE: 111 mg/dL — ABNORMAL HIGH (ref 70–105)
GLUCOSE, POINT OF CARE: 123 mg/dL — ABNORMAL HIGH (ref 70–105)

## 2013-11-22 SURGERY — PLACEMENT SPLINT NASAL
Anesthesia: General | Site: Nose | Laterality: Bilateral | Wound class: Clean Contaminated Wounds-The respiratory, GI, Genital, or urinary

## 2013-11-22 MED ORDER — FENTANYL (PF) 50 MCG/ML INJECTION SOLUTION
Freq: Once | INTRAMUSCULAR | Status: DC | PRN
Start: 2013-11-22 — End: 2013-11-22
  Administered 2013-11-22: 50 ug via INTRAVENOUS

## 2013-11-22 MED ORDER — LACTATED RINGERS INTRAVENOUS SOLUTION
INTRAVENOUS | Status: DC
Start: 2013-11-22 — End: 2013-11-22
  Administered 2013-11-22: 0 via INTRAVENOUS

## 2013-11-22 MED ORDER — ONDANSETRON HCL (PF) 4 MG/2 ML INJECTION SOLUTION
Freq: Once | INTRAMUSCULAR | Status: DC | PRN
Start: 2013-11-22 — End: 2013-11-22
  Administered 2013-11-22: 4 mg via INTRAVENOUS

## 2013-11-22 MED ORDER — ACETAMINOPHEN 325 MG TABLET
975.0000 mg | ORAL_TABLET | Freq: Once | ORAL | Status: DC | PRN
Start: 2013-11-22 — End: 2013-11-22
  Filled 2013-11-22: qty 3

## 2013-11-22 MED ORDER — LIDOCAINE (PF) 100 MG/5 ML (2 %) INTRAVENOUS SYRINGE
INJECTION | Freq: Once | INTRAVENOUS | Status: DC | PRN
Start: 2013-11-22 — End: 2013-11-22
  Administered 2013-11-22: 100 mg via INTRAVENOUS

## 2013-11-22 MED ORDER — HYDROMORPHONE 1 MG/ML INJECTION WRAPPER
INJECTION | INTRAMUSCULAR | Status: AC
Start: 2013-11-22 — End: 2013-11-22
  Administered 2013-11-22: 0.5 mg via INTRAVENOUS
  Filled 2013-11-22: qty 1

## 2013-11-22 MED ORDER — ACETAMINOPHEN 325 MG TABLET
ORAL_TABLET | ORAL | Status: AC
Start: 2013-11-22 — End: 2013-11-22
  Administered 2013-11-22: 975 mg via ORAL
  Filled 2013-11-22: qty 3

## 2013-11-22 MED ORDER — HYDROMORPHONE 1 MG/ML INJECTION WRAPPER
0.50 mg | INJECTION | INTRAMUSCULAR | Status: DC | PRN
Start: 2013-11-22 — End: 2013-11-22
  Filled 2013-11-22: qty 1

## 2013-11-22 MED ORDER — MIDAZOLAM 1 MG/ML INJECTION SOLUTION
Freq: Once | INTRAMUSCULAR | Status: DC | PRN
Start: 2013-11-22 — End: 2013-11-22
  Administered 2013-11-22: 2 mg via INTRAVENOUS

## 2013-11-22 MED ORDER — HYDROCHLOROTHIAZIDE 25 MG TABLET
25.0000 mg | ORAL_TABLET | ORAL | Status: AC
Start: 2013-11-22 — End: 2013-11-22
  Administered 2013-11-22: 25 mg via ORAL
  Filled 2013-11-22 (×2): qty 1

## 2013-11-22 MED ORDER — SODIUM CHLORIDE 0.9 % (FLUSH) INJECTION SYRINGE
2.00 mL | INJECTION | Freq: Three times a day (TID) | INTRAMUSCULAR | Status: DC
Start: 2013-11-22 — End: 2013-11-22

## 2013-11-22 MED ORDER — HYDROMORPHONE 1 MG/ML INJECTION WRAPPER
0.2000 mg | INJECTION | INTRAMUSCULAR | Status: DC | PRN
Start: 2013-11-22 — End: 2013-11-22
  Administered 2013-11-22: 0.5 mg via INTRAVENOUS
  Filled 2013-11-22: qty 1

## 2013-11-22 MED ORDER — TRAMADOL 50 MG TABLET
50.0000 mg | ORAL_TABLET | ORAL | Status: AC
Start: 2013-11-22 — End: 2013-11-22
  Administered 2013-11-22: 50 mg via ORAL
  Filled 2013-11-22 (×2): qty 1

## 2013-11-22 MED ORDER — OXYMETAZOLINE 0.05 % NASAL SPRAY
1.0000 | Freq: Once | NASAL | Status: DC | PRN
Start: 2013-11-22 — End: 2013-11-22
  Administered 2013-11-22: 30 mL via TOPICAL
  Filled 2013-11-22: qty 30

## 2013-11-22 MED ORDER — BACITRACIN ZINC 500 UNIT/GRAM TOPICAL OINTMENT
TOPICAL_OINTMENT | Freq: Two times a day (BID) | CUTANEOUS | Status: DC | PRN
Start: 2013-11-22 — End: 2022-02-26

## 2013-11-22 MED ORDER — LISINOPRIL 10 MG TABLET
20.0000 mg | ORAL_TABLET | ORAL | Status: AC
Start: 2013-11-22 — End: 2013-11-22
  Administered 2013-11-22: 20 mg via ORAL
  Filled 2013-11-22: qty 2

## 2013-11-22 MED ORDER — PROPOFOL 10 MG/ML IV BOLUS
INJECTION | Freq: Once | INTRAVENOUS | Status: DC | PRN
Start: 2013-11-22 — End: 2013-11-22
  Administered 2013-11-22: 200 mg via INTRAVENOUS

## 2013-11-22 MED ORDER — SODIUM CHLORIDE 0.9 % (FLUSH) INJECTION SYRINGE
2.00 mL | INJECTION | INTRAMUSCULAR | Status: DC | PRN
Start: 2013-11-22 — End: 2013-11-22

## 2013-11-22 MED ORDER — LIDOCAINE 1 %-EPINEPHRINE 1:100,000 INJECTION SOLUTION
5.0000 mL | Freq: Once | INTRAMUSCULAR | Status: DC | PRN
Start: 2013-11-22 — End: 2013-11-22
  Administered 2013-11-22: 0.5 mL via INTRAMUSCULAR

## 2013-11-22 MED ORDER — ONDANSETRON HCL (PF) 4 MG/2 ML INJECTION SOLUTION
4.0000 mg | Freq: Once | INTRAMUSCULAR | Status: DC | PRN
Start: 2013-11-22 — End: 2013-11-22
  Filled 2013-11-22: qty 2

## 2013-11-22 SURGICAL SUPPLY — 21 items
BLANKET 3M BAIR HUG ADLT LWR B ODY 60X36IN PLMR AIR SYS LTWT (MISCELLANEOUS PT CARE ITEMS) ×1 IMPLANT
CONN PERF Y STR RDC 1X .75X .25IN .25IN STRL (Connecting Tubes/Misc) ×2 IMPLANT
CONV USE 23866 - NEEDLE HYPO 27GA 1.5IN STD MONOJECT SS POLYPROP REG BVL LL HUB UL SHRP ANTICORE YW STRL LF  DISP (NEEDLES & SYRINGE SUPPLIES) ×1 IMPLANT
CONV USE ITEM 108095 - SYRINGE DOVER 60ML LF  STRL IRRG PROTECT CAP BULB (NEEDLES & SYRINGE SUPPLIES) ×1 IMPLANT
DONUT EXTREMITY CUSHIONING 31143137 (POSITIONING PRODUCTS) ×1 IMPLANT
KIT RM TURNOVER CLEANOP CSTM INFCT CONTROL (KITS & TRAYS (DISPOSABLE)) ×1
KIT RM TURNOVER CLEANOP CUSTOM INFCT CONTROL (KITS & TRAYS (DISPOSABLE)) ×1 IMPLANT
LABEL MED EZ PEEL MRKR LF (LABELS/CHART SUPPLIES) ×1 IMPLANT
NEEDLE SPINAL BLU 3IN 25GA QUINCKE STD LGTH REG WL POLYPROP STRL LF  DISP (NEEDLES & SYRINGE SUPPLIES) ×1 IMPLANT
PACK SURG ECLIPSE EENT II TBL CVR SUT BAG HEAD TRBN DRP 90X50IN 40X27IN LF (DRAPE/PACKS/SHEETS/OR TOWEL) ×1 IMPLANT
PACKING 4X4CM NASAL DRESS SIN STENT MRGL HYAFF (WOUND CARE SUPPLY) ×2 IMPLANT
PAD ARMBRD BLU (POSITIONING PRODUCTS) ×2 IMPLANT
PAD RELEASE 3 X 4 IN_1050 50/BX (WOUND CARE SUPPLY) ×2 IMPLANT
SOL ANFG DFGR ISOPRPNL PAD OVAL BTL NABRSV ADH STRL LF  DISP (KITS & TRAYS (DISPOSABLE)) ×1 IMPLANT
SPLINT NASAL DOYLE WHT (ENT SUPPLIES) ×1 IMPLANT
SPONGE SURG 4X4IN 16 PLY RADOPQ BAND VISTEC STRL LF  BLU WHT (WOUND CARE SUPPLY) ×1 IMPLANT
STRIP SURG XRAY DTBL 1/2 X 6IN_80-1451 (WOUND CARE SUPPLY) ×1 IMPLANT
SYRINGE DOVER 60ML LF  STRL IRRG PROTECT CAP BULB (NEEDLES & SYRINGE SUPPLIES) ×1
SYRINGE LL 10ML LF  STRL CONTROL CONCEN TIP PRGN FREE DEHP-FR MED DISP (NEEDLES & SYRINGE SUPPLIES) ×1 IMPLANT
TUBING SUCT CLR 20FT 9/32IN MEDIVAC NCDTV M/M CONN STRL LF (Suction) ×1 IMPLANT
WIPE 3X3IN LF  ULTRACELL SURG INSTR SOLAN THK2MM (CLEN) ×1 IMPLANT

## 2013-11-22 NOTE — OR PostOp (Signed)
Dr. Logan Bores on the phone speaking with patient at this time.

## 2013-11-22 NOTE — OR PostOp (Deleted)
 Received report from S. Cammie, Charity fundraiser.  No change from previous assessment.  Reviewed allergies and history with CANDIE Cammie, RN.

## 2013-11-22 NOTE — OR Surgeon (Addendum)
 [place]  [facility]  DEPARTMENT OF OTOLARYNGOLOGY  OPERATIVE REPORT        PATIENT NAME:  Juan George 37 y.o. male  MRN: [id number]  DOB:  05-03-1976  DATE OF SERVICE:  11/22/2013       PREOPERATIVE DIAGNOSES:  1.  Septal perforation    POSTOPERATIVE DIAGNOSES:    1.  Septal perforation    NAME OF PROCEDURES:  1.  Placement of septal button    SURGEONS:  Dayton T. Interval, MD (primary surgeon), Tanda Liverpool, MD (staff surgeon).    ANESTHESIA:  General LMA anesthesia    ESTIMATED BLOOD LOSS: minimal.    OPERATIVE FINDINGS:  1.  Fairly large septal perforation, approximately 2 cm in anteroposterior dimension by 1.5 cm in superoinferior dimension     SPECIMENS:  none    COMPLICATIONS:  None.    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 We previously on 02/15/2013 performed a septal perforation repair via open rhinoplasty, with right conchal cartilage graft. However on post-op day 1 he removed his splints and the cartilage graft came with it. We therefore could not perform further septal flap for repair. When we last saw him in clinic we offered placement of a silastic septal button and he was agreeable to this. He was therefore deemed an appropriate candidate.    DESCRIPTION OF PROCEDURE:  After ensuring we had appropriate informed consent and patient identification in the preoperative holding area, the patient was escorted back to the operating room by anesthesia.  Once in the operating room, a surgical pause was conducted. An LMA was placed for general anesthesia. The edges of the septal perforation were injected with < 1 cc of 1% lidocaine  with epinephrine . Afrin / lidocaine  pledgets were placed bilaterally for several minutes. A white 4x4 wipe was used as a template for the septal button. The septal button was then cut flat on the inferior aspect, and angled superiorly, to shape it to the patient's nose. It was then positioned through the  perforation. The inferior edges were further cut to better fit his nose. A silk suture was placed anteriorly, through the silastic and cartilaginous septum, to hold it in place.   The patient was then turned back over to anesthesia, allowed to wake up, and taken to the postoperative recovery area in stable condition.  Dr. Liverpool was present for the key and critical portions of the procedure.    PLEASE DISCHARGE PATIENT TO HOME IF RDSC CRITERIA ARE MET.    Dayton DASEN. Interval, MD  Resident  [place] Dept. Of Otolaryngology    I was present for all key and/or critical portions of the case and immediately available at all times.    Tanda Liverpool, MD 11/22/2013, 2:27 PM    The patient tolerated the procedure well. Patient was discharged when criteria were meet.  Tanda Liverpool, MD 11/22/2013, 2:27 PM

## 2013-11-22 NOTE — OR PostOp (Signed)
Paged Dr. Logan Bores regarding patients request for pain medication.

## 2013-11-22 NOTE — Anesthesia Postprocedure Evaluation (Signed)
ANESTHESIA POSTOP EVALUATION NOTE        Anesthesia Service      [facility]     11/22/2013     Last Vitals: Temperature: 36.2 C (97.2 F) (11/22/13 1259)  Heart Rate: 78 (11/22/13 1259)  BP (Non-Invasive): 123/76 mmHg (11/22/13 1259)  Respiratory Rate: 20 (11/22/13 1058)  SpO2-1: 94 % (11/22/13 1259)  Pain Score (Numeric, Faces): 0 (11/22/13 1130)    Procedure(s):  PLACEMENT SPLINT NASAL    Patient is sufficiently recovered from the effects of anesthesia to participate in the evaluation and has returned to their pre-procedure level.  I have reviewed and evaluated the following:  Respiratory Function: Consistent with pre anesthetic level  Cardiovascular Function: Consistent with pre anesthetic level  Mental Status: Return to pre anesthetic baseline level  Pain: Sufficiently controlled with medication  Nausea and Vomiting: Absent or sufficiently controlled with medication  Post-op Anesthetic Complications: None    Comment/ re-evaluation for any variations: None

## 2013-11-22 NOTE — OR PostOp (Signed)
 MD states patient maybe discharged up medication received.  Pt. Discharged to home via wheelchair.

## 2013-11-22 NOTE — Anesthesia Transfer of Care (Signed)
ANESTHESIA TRANSFER OF CARE NOTE        Anesthesia Service      [facility]         Last Vitals: Temperature: 36.2 C (97.2 F) (11/22/13 1259)  Heart Rate: 78 (11/22/13 1259)  BP (Non-Invasive): 123/76 mmHg (11/22/13 1259)  Respiratory Rate: 20 (11/22/13 1058)  SpO2-1: 94 % (11/22/13 1259)  Pain Score (Numeric, Faces): 0 (11/22/13 1130)    Patient transferred to PACU in stable condition. Report given to RN.    12/9/2014at 12:59 PM.

## 2013-11-22 NOTE — Discharge Instructions (Signed)
SURGICAL DISCHARGE INSTRUCTIONS     Dr. Alford Highland, MD  performed your PLACEMENT SPLINT NASAL today at the [facility]    [facility]:  Monday through Friday from 6 a.m. - 7 p.m.: [phone]  Between 7 p.m. - 6 a.m., weekends and holidays:  Call [facility] at [phone] or [phone].    PLEASE SEE WRITTEN HANDOUTS AS DISCUSSED BY YOUR NURSE:  Sinus Surgery    SIGNS AND SYMPTOMS OF A WOUND / INCISION INFECTION   Be sure to watch for the following:   Increase in redness or red streaks near or around the wound or incision.   Increase in pain that is intense or severe and cannot be relieved by the pain medication that your doctor has given you.   Increase in swelling that cannot be relieved by elevation of a body part, or by applying ice, if permitted.   Increase in drainage, or if yellow / green in color and smells bad. This could be on a dressing or a cast.   Increase in fever for longer than 24 hours, or an increase that is higher than 101 degrees Fahrenheit (normal body temperature is 98 degrees Fahrenheit). The incision may feel warm to the touch.    **CALL YOUR DOCTOR IF ONE OR MORE OF THESE SIGNS / SYMPTOMS SHOULD OCCUR.    ANESTHESIA INFORMATION   ANESTHESIA -- ADULT PATIENTS:  You have received intravenous sedation / general anesthesia, and you may feel drowsy and light-headed for several hours. You may even experience some forgetfulness of the procedure. DO NOT DRIVE A MOTOR VEHICLE or perform any activity requiring complete alertness or coordination until you feel fully awake in about 24-48 hours. Do not drink alcoholic beverages for at least 24 hours. Do not stay alone, you must have a responsible adult available to be with you. You may also experience a dry mouth or nausea for 24 hours. This is a normal side effect and will disappear as the effects of the medication wear off.    REMEMBER   If you experience any difficulty breathing, chest pain, bleeding that  you feel is excessive, persistent nausea or vomiting or for any other concerns:  Call your physician Dr. Aundria Rud at [phone] or [phone]. You may also ask to have the ENT doctor on call paged. They are available to you 24 hours a day.    SPECIAL INSTRUCTIONS / COMMENTS       FOLLOW-UP APPOINTMENTS   Please call patient services at [phone] or [phone] to schedule a date / time of return. They are open Monday - Friday from 7:30 am - 5:00 pm.

## 2013-11-22 NOTE — H&P (Addendum)
[facility]  OTOLARYNGOLOGY DEPARTMENT  H&P Update    Date: 11/22/2013  Name: Juan George, 37 y.o. male  MRN: [id number]  DOB: 1976-09-04    Pre-Surgical H & P updated the day of the procedure.  See full H&P from 05/13/13 for complete details.  H&P assessment updated as follows:    Chief Complaint: septal perforation    History of Present Illness: This is a 37 y.o. male who was seen in our clinic on 05/02/13 and found to have septal perforation. Therefore, he was deemed to be a good candidate to undergo septal button placement. Since that time he has had no infections, fevers, or chills.    Past Medical History/Surgical History/Family History/Social History = No changes.  Change in Medications: No  Allergies   Allergen Reactions    Nka [No Known Allergies]    Review of Systems: no fevers / chills    Physical Examination: No acute distress.  No respiratory distress no stridor.  No cyanosis of the upper extremities.  No epistaxis.  No otorrhea.  Moves all extremities.    Assessment:     37 y.o. male with septal perforation    Plan:     1. Septal button placement    2.  Patient continues to be appropiate candidate for planned surgical procedure. YES      Charisse March, MD, 11/22/2013, 12:06 PM  [facility] Department of Otolaryngology-Head and Neck Surgery    I saw and examined the patient.  I reviewed the resident's note.  I agree with the findings and plan of care as documented in the resident's note.  Any exceptions/additions are edited/noted.    Alford Highland, MD 11/22/2013, 2:27 PM

## 2013-11-22 NOTE — OR PostOp (Signed)
Pt. States ready for discharge and not waiting for MD to return page.  Patient's BP elevated at this time.

## 2013-11-22 NOTE — OR PostOp (Signed)
After Visit Summary (AVS) and education hand-outs were given to patient and family after instructional information was reviewed & understood.

## 2013-11-22 NOTE — OR PostOp (Signed)
 Paged Dr. Jude regarding patient status.  Unable to receive a return call from Resident.

## 2013-11-22 NOTE — OR PostOp (Signed)
New orders received at this time.  Patient can be discharged once regular home medication taken.

## 2013-11-24 ENCOUNTER — Other Ambulatory Visit (INDEPENDENT_AMBULATORY_CARE_PROVIDER_SITE_OTHER): Payer: Self-pay | Admitting: Otolaryngology

## 2013-11-24 NOTE — Telephone Encounter (Signed)
Patient called into clinic very agitated stating, "That doctor sent me home without any pain medication! My nose is crusty and I can't put ointment on it because it hurts so bad I can't even touch it!" Per Dr. Aundria Rud patient is to have lortab 10/325 dispense 20 with no refills. Script written and mailed to patient at [address] [place], [place], [place]. Patient made aware.

## 2013-11-25 MED ORDER — HYDROCODONE 10 MG-ACETAMINOPHEN 325 MG TABLET
1.00 | ORAL_TABLET | Freq: Four times a day (QID) | ORAL | Status: DC | PRN
Start: 2013-11-24 — End: 2022-02-26

## 2013-12-06 ENCOUNTER — Ambulatory Visit (INDEPENDENT_AMBULATORY_CARE_PROVIDER_SITE_OTHER): Payer: Self-pay | Admitting: Otolaryngology

## 2013-12-06 NOTE — Telephone Encounter (Signed)
 Patient states that he has swelling, pain and a foul smell coming from his nose. He states that he lives over 4 hours away and has a hard time finding transportation here. Advised that he would need to be seen and that I could try to get him on the schedule as soon as possible. Advised that he could try local ED or urgent care. He will try the ED and call back if there are any questions.

## 2013-12-06 NOTE — Telephone Encounter (Signed)
 Message copied by CHRISTIN THERSIA HERO on Tue Dec 06, 2013  1:40 PM  ------       Message from: DALLIE SIMMERS       Created: Tue Dec 06, 2013  1:32 PM         >> SIMMERS DALLIE 12/06/2013 01:32 PM       DR. JUDE PT//////////////////pt states that family states there is a redness in side of nose around plastic, pt states that he is having an odor coming from nose and is painful. If no answer at above # please call [phone]. Pt states that he is going to have to do something if he does not heard back soon he is going to have to go to local emergency room. Please call and advise. Thank you  ------

## 2013-12-12 ENCOUNTER — Encounter (INDEPENDENT_AMBULATORY_CARE_PROVIDER_SITE_OTHER): Payer: MEDICAID | Admitting: Otolaryngology

## 2014-01-18 ENCOUNTER — Encounter (INDEPENDENT_AMBULATORY_CARE_PROVIDER_SITE_OTHER): Payer: MEDICAID | Admitting: Otolaryngology

## 2015-07-26 ENCOUNTER — Encounter (INDEPENDENT_AMBULATORY_CARE_PROVIDER_SITE_OTHER): Payer: MEDICAID | Admitting: Otolaryngology

## 2019-12-15 ENCOUNTER — Inpatient Hospital Stay (HOSPITAL_COMMUNITY): Admission: RE | Admit: 2019-12-15 | Payer: MEDICAID | Source: Ambulatory Visit | Admitting: Otolaryngology

## 2019-12-16 ENCOUNTER — Encounter (HOSPITAL_COMMUNITY): Payer: Self-pay | Admitting: Emergency Medicine

## 2019-12-16 ENCOUNTER — Emergency Department (HOSPITAL_COMMUNITY): Payer: Medicaid - Out of State

## 2019-12-16 ENCOUNTER — Other Ambulatory Visit: Payer: Self-pay

## 2019-12-16 ENCOUNTER — Emergency Department (HOSPITAL_COMMUNITY)
Admission: EM | Admit: 2019-12-16 | Discharge: 2019-12-16 | Disposition: A | Discharge status: Home / Self Care | Payer: Medicaid - Out of State | Attending: Emergency Medicine | Admitting: Emergency Medicine

## 2019-12-16 ENCOUNTER — Emergency Department (HOSPITAL_COMMUNITY)
Admission: EM | Admit: 2019-12-16 | Discharge: 2019-12-17 | Disposition: A | Discharge status: Home / Self Care | Payer: Medicaid - Out of State | Source: Home / Self Care | Attending: Emergency Medicine | Admitting: Emergency Medicine

## 2019-12-16 DIAGNOSIS — G8929 Other chronic pain: Secondary | ICD-10-CM | POA: Diagnosis not present

## 2019-12-16 DIAGNOSIS — F32A Depression, unspecified: Secondary | ICD-10-CM

## 2019-12-16 DIAGNOSIS — R45850 Homicidal ideations: Secondary | ICD-10-CM | POA: Insufficient documentation

## 2019-12-16 DIAGNOSIS — F17210 Nicotine dependence, cigarettes, uncomplicated: Secondary | ICD-10-CM | POA: Diagnosis not present

## 2019-12-16 DIAGNOSIS — F1014 Alcohol abuse with alcohol-induced mood disorder: Secondary | ICD-10-CM | POA: Diagnosis present

## 2019-12-16 DIAGNOSIS — U071 COVID-19: Secondary | ICD-10-CM | POA: Diagnosis not present

## 2019-12-16 DIAGNOSIS — R45851 Suicidal ideations: Secondary | ICD-10-CM

## 2019-12-16 DIAGNOSIS — F329 Major depressive disorder, single episode, unspecified: Secondary | ICD-10-CM

## 2019-12-16 DIAGNOSIS — M160 Bilateral primary osteoarthritis of hip: Secondary | ICD-10-CM | POA: Diagnosis not present

## 2019-12-16 DIAGNOSIS — I10 Essential (primary) hypertension: Secondary | ICD-10-CM | POA: Diagnosis not present

## 2019-12-16 DIAGNOSIS — F322 Major depressive disorder, single episode, severe without psychotic features: Secondary | ICD-10-CM | POA: Insufficient documentation

## 2019-12-16 HISTORY — DX: Essential (primary) hypertension: I10

## 2019-12-16 LAB — COMPREHENSIVE METABOLIC PANEL
ALT: 44 U/L (ref 0–44)
AST: 40 U/L (ref 15–41)
Albumin: 4 g/dL (ref 3.5–5.0)
Alkaline Phosphatase: 67 U/L (ref 38–126)
Anion gap: 12 (ref 5–15)
BUN: 15 mg/dL (ref 6–20)
CO2: 23 mmol/L (ref 22–32)
Calcium: 8.9 mg/dL (ref 8.9–10.3)
Chloride: 106 mmol/L (ref 98–111)
Creatinine, Ser: 0.9 mg/dL (ref 0.61–1.24)
GFR calc Af Amer: >60 mL/min (ref >60)
GFR calc non Af Amer: >60 mL/min (ref >60)
Glucose, Bld: 139 mg/dL — ABNORMAL HIGH (ref 70–99)
Potassium: 4.4 mmol/L (ref 3.5–5.1)
Sodium: 141 mmol/L (ref 135–145)
Total Bilirubin: 0.2 mg/dL — ABNORMAL LOW (ref 0.3–1.2)
Total Protein: 7.5 g/dL (ref 6.5–8.1)

## 2019-12-16 LAB — CBC WITH DIFFERENTIAL/PLATELET
Abs Immature Granulocytes: 0.02 10*3/uL (ref 0.00–0.07)
Basophils Absolute: 0 10*3/uL (ref 0.0–0.1)
Basophils Relative: 0 %
Eosinophils Absolute: 0.2 10*3/uL (ref 0.0–0.5)
Eosinophils Relative: 3 %
HCT: 45.8 % (ref 39.0–52.0)
Hemoglobin: 14.8 g/dL (ref 13.0–17.0)
Immature Granulocytes: 0 %
Lymphocytes Relative: 18 %
Lymphs Abs: 1 10*3/uL (ref 0.7–4.0)
MCH: 31.8 pg (ref 26.0–34.0)
MCHC: 32.3 g/dL (ref 30.0–36.0)
MCV: 98.3 fL (ref 80.0–100.0)
Monocytes Absolute: 0.2 10*3/uL (ref 0.1–1.0)
Monocytes Relative: 3 %
Neutro Abs: 4.4 10*3/uL (ref 1.7–7.7)
Neutrophils Relative %: 76 %
Platelets: 210 10*3/uL (ref 150–400)
RBC: 4.66 MIL/uL (ref 4.22–5.81)
RDW: 12.9 % (ref 11.5–15.5)
WBC: 5.7 10*3/uL (ref 4.0–10.5)
nRBC: 0 % (ref 0.0–0.2)

## 2019-12-16 LAB — RESPIRATORY PANEL BY RT PCR (FLU A&B, COVID)
Influenza A by PCR: NEGATIVE
Influenza B by PCR: NEGATIVE
SARS Coronavirus 2 by RT PCR: POSITIVE — AB

## 2019-12-16 LAB — ETHANOL: Alcohol, Ethyl (B): 94 mg/dL — ABNORMAL HIGH (ref <10)

## 2019-12-16 MED ORDER — LISINOPRIL 20 MG PO TABS
30.0000 mg | ORAL_TABLET | Freq: Once | ORAL | Status: AC
  Administered 2019-12-16: 30 mg via ORAL
  Filled 2019-12-16: qty 1

## 2019-12-16 MED ORDER — NAPROXEN 375 MG PO TABS: 375.0000 mg | ORAL_TABLET | Freq: Two times a day (BID) | ORAL | 0 refills | Status: DC

## 2019-12-16 MED ORDER — KETOROLAC TROMETHAMINE 15 MG/ML IJ SOLN
30.0000 mg | Freq: Once | INTRAMUSCULAR | Status: AC
  Administered 2019-12-16: 30 mg via INTRAMUSCULAR
  Filled 2019-12-16 (×2): qty 2

## 2019-12-16 MED ORDER — PREDNISONE 10 MG PO TABS: 10.0000 mg | ORAL_TABLET | Freq: Every day | ORAL | 0 refills | Status: DC

## 2019-12-16 MED ORDER — CYCLOBENZAPRINE HCL 10 MG PO TABS: 10.0000 mg | ORAL_TABLET | Freq: Two times a day (BID) | ORAL | 0 refills | Status: DC | PRN

## 2019-12-16 MED ORDER — DEXAMETHASONE SODIUM PHOSPHATE 10 MG/ML IJ SOLN
8.0000 mg | Freq: Once | INTRAMUSCULAR | Status: AC
  Administered 2019-12-16: 8 mg via INTRAMUSCULAR
  Filled 2019-12-16: qty 1

## 2019-12-16 MED ORDER — LISINOPRIL 20 MG PO TABS
20.0000 mg | ORAL_TABLET | Freq: Once | ORAL | Status: AC
  Administered 2019-12-17: 20 mg via ORAL
  Filled 2019-12-16 (×2): qty 1

## 2019-12-16 MED ORDER — KETOROLAC TROMETHAMINE 30 MG/ML IJ SOLN: 30.0000 mg | Freq: Once | INTRAMUSCULAR | Status: DC

## 2019-12-16 NOTE — ED Triage Notes (Signed)
Per pt, states he is tired going thru all this pain, doesn't like his living conditions-can't get his medicaid approved-would feel better if he could "wack" himself off

## 2019-12-16 NOTE — ED Triage Notes (Signed)
Per pt, states he fell off a roof in 2001-has been having right hip pain since-states he is waiting for his insurance to come thru so he can see an ortho MD

## 2019-12-16 NOTE — ED Notes (Signed)
Pt cooperative. Pt has SI, contracts for safety while in hospital.

## 2019-12-16 NOTE — ED Notes (Signed)
Vitals documented at at 21:44 were taken at 21:20.

## 2019-12-16 NOTE — Discharge Instructions (Addendum)
Your hip x-ray is grossly abnormal.  You will need follow-up with an orthopedic surgeon.  Phone number given.  You must call for an appointment.  Prescription for anti-inflammatory pain medicine, muscle relaxer, prednisone.

## 2019-12-16 NOTE — BH Assessment (Signed)
Tele Assessment Note   Patient Name: Chad Bautista MRN: [id number] Referring Physician: Donnetta Hutching Location of Patient: [place] Location of Provider: Behavioral Health TTS Department  Paolo Okane is an 44 y.o. male who initially presented to the ED due to hip pain.  Upon his discharge from the ED, he indicated that he was having suicidal thoughts.  Patient states that he recently moved to this area from [place], [place] to the [employer] because he states that, "I had nowhere to go."  Patient states that he is a former cocaine user, but states that he stopped using cocaine five years ago.  He states that he continues to drink alcohol despite the fact that he is in a program.  Patient states that he drank some last night and states that, "I have no intentions of stopping drinking."  On average, he states that he drinks a few beers 2-3 times weekly.  Patient states that his chronic hip pain issues, working full-time and the half-way house program is taking the majority of his money for him to stay there.  He states that he is frustrated because he cannot get Medicaid to assist him in having a hip replacement and states that he has had thoughts about killing other people and himself.  Patient does not identify any plan that he would use to hurt himself or others and states, "it is not anything that I want to say."  Patient states that he has tried to kill himself on approximately 3 times in the past, but states that he has not been on a psych unit in at least six to ten years.  Patient states that he has never followed up with anyone on an outpatient basis.  He is not currently on any mental health medications.  Patient denies any history of psychosis.  Patient states that he has no history of abuse or self-mutilation.  Patient states that he has insomnia and only sleeps 2 hours a night and he states that he has experienced a deceased appetite and states that he has lost between  15-20 pounds.  Patient presented as alert and oriented.  His mood is depressed and his affect is flat.  His judgment, insight and impulse control were impaired.  He did not appear to be responding to any internal stimuli.  His thoughts were organized and his memory intact.  He maintained good eye contact and his speech was coherent.  Diagnosis: F32.2 MDD Single Episode Severe  Past Medical History:  Past Medical History:  Diagnosis Date  . Hypertension     History reviewed. No pertinent surgical history.  Family History: No family history on file.  Social History:  reports that he has been smoking. He has been smoking about 0.50 packs per day. He has never used smokeless tobacco. He reports current alcohol use. He reports current drug use. Drug: Cocaine.  Additional Social History:  Alcohol / Drug Use Pain Medications: see MAR Prescriptions: see MAR Over the Counter: see MAR History of alcohol / drug use?: Yes Longest period of sobriety (when/how long): states that he has been clean from cocaine for five years Negative Consequences of Use: Financial, [employer] forces operational [employer], Personal relationships, Work / School Substance #1 Name of Substance 1: alcohol 1 - Age of First Use: UTA 1 - Amount (size/oz): few beers 1 - Frequency: 2-3 times a week 1 - Duration: unknown 1 - Last Use / Amount: last pm  CIWA: CIWA-Ar BP: (!) 134/93 Pulse Rate: 99 COWS:  Allergies: Not on File  Home Medications: (Not in a hospital admission)   OB/GYN Status:  No LMP for male patient.  General Assessment Data TTS Assessment: In system Is this a Tele or Face-to-Face Assessment?: Tele Assessment Is this an Initial Assessment or a Re-assessment for this encounter?: Initial Assessment Patient Accompanied by:: N/A Language Other than English: No Living Arrangements: Other (Comment)([facility] Living [place]) What gender do you identify as?: Male Marital status: Single Living Arrangements: Other (Comment)(halfway  house) Can pt return to current living arrangement?: Yes Admission Status: Voluntary Is patient capable of signing voluntary admission?: Yes Referral Source: Self/Family/Friend Insurance type: Self-pay     Crisis Care Plan Living Arrangements: Other (Comment)(halfway house) Legal Guardian: Other:(self) Name of Psychiatrist: none Name of Therapist: none  Education Status Is patient currently in school?: No Is the patient employed, unemployed or receiving disability?: Employed  Risk to self with the past 6 months Suicidal Ideation: Yes-Currently Present Has patient been a risk to self within the past 6 months prior to admission? : No Suicidal Intent: Yes-Currently Present Has patient had any suicidal intent within the past 6 months prior to admission? : No Is patient at risk for suicide?: Yes Suicidal Plan?: No(will not identify plan) Has patient had any suicidal plan within the past 6 months prior to admission? : No Access to Means: (unknown, will not identify plan) What has been your use of drugs/alcohol within the last 12 months?: regular alcohol use Previous Attempts/Gestures: Yes How many times?: (2-3 prior attempts) Other Self Harm Risks: (minimal support) Triggers for Past Attempts: None known Intentional Self Injurious Behavior: None Family Suicide History: No Recent stressful life event(s): Financial Problems Persecutory voices/beliefs?: No Depression: Yes Depression Symptoms: Despondent, Insomnia, Isolating, Loss of interest in usual pleasures Substance abuse history and/or treatment for substance abuse?: Yes Suicide prevention information given to non-admitted patients: Not applicable  Risk to Others within the past 6 months Homicidal Ideation: Yes-Currently Present Does patient have any lifetime risk of violence toward others beyond the six months prior to admission? : No Thoughts of Harm to Others: Yes-Currently Present Comment - Thoughts of Harm to Others:  will not identify any plan or victim Current Homicidal Intent: No Current Homicidal Plan: No Access to Homicidal Means: No Identified Victim: will not identify History of harm to others?: No Assessment of Violence: None Noted Violent Behavior Description: none reported Does patient have access to weapons?: No Criminal Charges Pending?: No Does patient have a court date: No Is patient on probation?: No  Psychosis Hallucinations: None noted Delusions: None noted  Mental Status Report Appearance/Hygiene: Unremarkable Eye Contact: Fair Motor Activity: Freedom of movement Speech: Logical/coherent Level of Consciousness: Alert Mood: Depressed, Apathetic Affect: Flat Anxiety Level: Moderate Thought Processes: Coherent, Relevant Judgement: Impaired Orientation: Person, Place, Time, Situation Obsessive Compulsive Thoughts/Behaviors: None  Cognitive Functioning Concentration: Normal Memory: Recent Intact, Remote Intact Is patient IDD: No Insight: Poor Impulse Control: Poor Appetite: Poor Have you had any weight changes? : Loss Amount of the weight change? (lbs): (15-20 pounds) Sleep: Decreased Total Hours of Sleep: 2 Vegetative Symptoms: Decreased grooming  ADLScreening [facility] Assessment Services) Patient's cognitive ability adequate to safely complete daily activities?: Yes Patient able to express need for assistance with ADLs?: Yes Independently performs ADLs?: Yes (appropriate for developmental age)  Prior Inpatient Therapy Prior Inpatient Therapy: Yes Prior Therapy Dates: 7 years ago Prior Therapy Facilty/Provider(s): facility in [place] Reason for Treatment: depression/suicidal  Prior Outpatient Therapy Prior Outpatient Therapy: No Does patient have  an ACCT team?: No Does patient have Intensive In-House Services?  : No Does patient have [facility] services? : No Does patient have P4CC services?: No  ADL Screening (condition at time of admission) Patient's  cognitive ability adequate to safely complete daily activities?: Yes Is the patient deaf or have difficulty hearing?: No Does the patient have difficulty seeing, even when wearing glasses/contacts?: No Does the patient have difficulty concentrating, remembering, or making decisions?: No Patient able to express need for assistance with ADLs?: Yes Does the patient have difficulty dressing or bathing?: No Independently performs ADLs?: Yes (appropriate for developmental age) Does the patient have difficulty walking or climbing stairs?: No Weakness of Legs: None Weakness of Arms/Hands: None  Home Assistive Devices/Equipment Home Assistive Devices/Equipment: None  Therapy Consults (therapy consults require a physician order) PT Evaluation Needed: No OT Evalulation Needed: No SLP Evaluation Needed: No Abuse/Neglect Assessment (Assessment to be complete while patient is alone) Abuse/Neglect Assessment Can Be Completed: Yes Physical Abuse: Denies Verbal Abuse: Denies Sexual Abuse: Denies Exploitation of patient/patient's resources: Denies Self-Neglect: Denies Values / Beliefs Cultural Requests During Hospitalization: None Spiritual Requests During Hospitalization: None Consults Spiritual Care Consult Needed: No Transition of Care Team Consult Needed: No Advance Directives (For Healthcare) Does Patient Have a Medical Advance Directive?: No Would patient like information on creating a medical advance directive?: No - Patient declined Nutrition Screen- [facility] Adult/WL/AP Has the patient recently lost weight without trying?: Yes, 14-23 lbs. Has the patient been eating poorly because of a decreased appetite?: Yes Malnutrition Screening Tool Score: 3        Disposition: Per Letitia Libra, NP, overnight observation for safety and will be re-assessed by the provider in the morning.  Disposition Initial Assessment Completed for this Encounter: Yes  This service was provided via telemedicine  using a 2-way, interactive audio and video technology.  Names of all persons participating in this telemedicine service and their role in this encounter. Name: Tor Tsuda Role: patient  Name: Melina Mosteller Role: TTS  Name:  Role:   Name:  Role:     Reatha Armour 12/16/2019 3:50 PM

## 2019-12-16 NOTE — ED Provider Notes (Addendum)
[place] DEPT Provider Note   CSN: [id number] Arrival date & time: 12/16/19  1112     History Chief Complaint  Patient presents with  . Suicidal    Chad Bautista is a 44 y.o. male with PMH significant for HTN, MDD, and chronic right-sided pain who returns to the ED shortly after being discharged for his right hip pain with suicidal ideation.  Patient reports that he is "sick of living with his pain and miserable living conditions" and wants to kill himself after taking a few others down too.  He denies any history of AVH, but endorses MDD and prior suicidal attempts including consumption of sleeping pills.  He states that he has been hospitalized for psychiatric illness in the past, but that it has been a while.  He is currently living in a sobriety home, but admits to alcohol consumption last night.  He denies any other illicit drug use.  He denies any specific plan to kill himself, but states that he is "at his limit" and is endorsing SI and HI.  Expresses frustration regarding his insurance limitations.  Aside from his chronic right hip discomfort, he denies any and all other symptoms on my exam.    HPI     Past Medical History:  Diagnosis Date  . Hypertension     There are no problems to display for this patient.   History reviewed. No pertinent surgical history.     No family history on file.  Social History   Tobacco Use  . Smoking status: Current Every Day Smoker    Packs/day: 0.50  . Smokeless tobacco: Never Used  Substance Use Topics  . Alcohol use: Yes    Comment: amount varies  . Drug use: Yes    Types: Cocaine    Comment: hx of cocaine use, states that he has been clean five years    Home Medications Prior to Admission medications   Medication Sig Start Date End Date Taking? Authorizing Provider  cyclobenzaprine (FLEXERIL) 10 MG tablet Take 1 tablet (10 mg total) by mouth 2 (two) times daily as needed for muscle spasms.  12/16/19   Nat Christen, MD  naproxen (NAPROSYN) 375 MG tablet Take 1 tablet (375 mg total) by mouth 2 (two) times daily. 12/16/19   Nat Christen, MD  predniSONE (DELTASONE) 10 MG tablet Take 1 tablet (10 mg total) by mouth daily with breakfast. 12/16/19   Nat Christen, MD    Allergies    Patient has no allergy information on record.  Review of Systems   Review of Systems  Musculoskeletal: Positive for arthralgias.  All other systems reviewed and are negative.   Physical Exam Updated Vital Signs BP (!) 156/95   Pulse (!) 106   Temp 98.9 F (37.2 C) (Oral)   Resp 16   SpO2 95%   Physical Exam Vitals and nursing note reviewed. Exam conducted with a chaperone present.  Constitutional:      Appearance: Normal appearance.  HENT:     Head: Normocephalic and atraumatic.  Eyes:     General: No scleral icterus.    Conjunctiva/sclera: Conjunctivae normal.  Cardiovascular:     Rate and Rhythm: Normal rate and regular rhythm.     Pulses: Normal pulses.     Heart sounds: Normal heart sounds.  Pulmonary:     Effort: Pulmonary effort is normal.  Musculoskeletal:     Comments: Right hip TTP, diminished ROM and strength. Distal pulses and sensation intact.  Skin:    General: Skin is dry.  Neurological:     Mental Status: He is alert.     GCS: GCS eye subscore is 4. GCS verbal subscore is 5. GCS motor subscore is 6.     Gait: Gait normal.  Psychiatric:        Mood and Affect: Mood normal.        Behavior: Behavior normal.        Thought Content: Thought content normal.     ED Results / Procedures / Treatments   Labs (all labs ordered are listed, but only abnormal results are displayed) Labs Reviewed  RESPIRATORY PANEL BY RT PCR (FLU A&B, COVID) - Abnormal; Notable for the following components:      Result Value   SARS Coronavirus 2 by RT PCR POSITIVE (*)    All other components within normal limits  COMPREHENSIVE METABOLIC PANEL - Abnormal; Notable for the following components:    Glucose, Bld 139 (*)    Total Bilirubin 0.2 (*)    All other components within normal limits  ETHANOL - Abnormal; Notable for the following components:   Alcohol, Ethyl (B) 94 (*)    All other components within normal limits  CBC WITH DIFFERENTIAL/PLATELET  RAPID URINE DRUG SCREEN, HOSP PERFORMED    EKG None  Radiology DG Hip Unilat W or Wo Pelvis 2-3 Views Right  Result Date: 12/16/2019 CLINICAL DATA:  Right hip pain EXAM: DG HIP (WITH OR WITHOUT PELVIS) 2-3V RIGHT COMPARISON:  None. FINDINGS: The right hip appears markedly abnormal. Suspect underlying avascular necrosis with collapse of the femoral head and secondary degenerative changes. Mild degenerative changes involving the left hip identified. No fractures or dislocations. IMPRESSION: 1. 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 2. Mild degenerative changes of the left hip. Electronically Signed   By: Signa Kell M.D.   On: 12/16/2019 10:19    Procedures Procedures (including critical care time)  Medications Ordered in ED Medications  ketorolac (TORADOL) 15 MG/ML injection 30 mg (30 mg Intramuscular Given 12/16/19 1815)    ED Course  I have reviewed the triage vital signs and the nursing notes.  Pertinent labs & imaging results that were available during my care of the patient were reviewed by me and considered in my medical decision making (see chart for details).   Clinical Course as of Dec 16 2051  Caleen Essex Dec 16, 2019  1706 Patient evaluated by [facility] she will be admitted overnight for observation with reevaluation in the morning.   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  Patient has  tested positive for COVID-19.  Blood work obtained is otherwise reassuring and patient is in no acute distress.  Vital signs are all within normal limits.  Patient is otherwise medically cleared.  Awaiting TTS evaluation to determine disposition.   Final Clinical Impression(s) / ED Diagnoses Final diagnoses:  Suicidal ideation  Homicidal ideation    Rx / DC Orders ED Discharge Orders    None       Lorelee New, PA-C 12/16/19 1707    Lorelee New, PA-C 12/16/19 2053  Donnetta Hutching, MD 12/19/19 [phone]

## 2019-12-16 NOTE — ED Notes (Signed)
Checked pt vitals and BP 178/107. He said he takes 30 mg lisinopril most morning. Sometimes, he takes two 30 mg tabs a day. One in the morning, then when he feels his BP high (said his vision blurs) he takes a second 30 mg dose. Messaged provider for lisinopril.

## 2019-12-16 NOTE — ED Notes (Signed)
MD/PA notified of abnormal lab

## 2019-12-17 ENCOUNTER — Emergency Department (HOSPITAL_COMMUNITY)
Admission: EM | Admit: 2019-12-17 | Discharge: 2019-12-18 | Disposition: A | Discharge status: Home / Self Care | Payer: MEDICARE | Attending: Emergency Medicine | Admitting: Emergency Medicine

## 2019-12-17 ENCOUNTER — Other Ambulatory Visit: Payer: Self-pay

## 2019-12-17 DIAGNOSIS — U071 COVID-19: Secondary | ICD-10-CM

## 2019-12-17 DIAGNOSIS — Z5900 Homelessness unspecified: Secondary | ICD-10-CM

## 2019-12-17 DIAGNOSIS — F1014 Alcohol abuse with alcohol-induced mood disorder: Secondary | ICD-10-CM | POA: Diagnosis present

## 2019-12-17 DIAGNOSIS — I10 Essential (primary) hypertension: Secondary | ICD-10-CM

## 2019-12-17 LAB — RAPID URINE DRUG SCREEN, HOSP PERFORMED
Amphetamines: NOT DETECTED
Barbiturates: NOT DETECTED
Benzodiazepines: NOT DETECTED
Cocaine: NOT DETECTED
Opiates: NOT DETECTED
Tetrahydrocannabinol: NOT DETECTED

## 2019-12-17 MED ORDER — IBUPROFEN 200 MG PO TABS
400.0000 mg | ORAL_TABLET | Freq: Four times a day (QID) | ORAL | Status: DC | PRN
  Administered 2019-12-17 – 2019-12-18 (×2): 400 mg via ORAL
  Filled 2019-12-17 (×2): qty 2

## 2019-12-17 MED ORDER — GABAPENTIN 400 MG PO CAPS
400.0000 mg | ORAL_CAPSULE | Freq: Two times a day (BID) | ORAL | Status: DC
  Administered 2019-12-17 – 2019-12-18 (×2): 400 mg via ORAL
  Filled 2019-12-17 (×2): qty 1

## 2019-12-17 MED ORDER — LISINOPRIL 10 MG PO TABS
10.0000 mg | ORAL_TABLET | Freq: Every day | ORAL | Status: DC
  Administered 2019-12-17 – 2019-12-18 (×2): 10 mg via ORAL
  Filled 2019-12-17 (×2): qty 1

## 2019-12-17 MED ORDER — PREDNISONE 20 MG PO TABS
10.0000 mg | ORAL_TABLET | Freq: Every day | ORAL | Status: DC
  Administered 2019-12-18: 10 mg via ORAL
  Filled 2019-12-17: qty 1

## 2019-12-17 MED ORDER — GABAPENTIN 100 MG PO CAPS
200.0000 mg | ORAL_CAPSULE | Freq: Two times a day (BID) | ORAL | Status: DC
  Administered 2019-12-17: 200 mg via ORAL
  Filled 2019-12-17: qty 2

## 2019-12-17 MED ORDER — GABAPENTIN 100 MG PO CAPS: 200.0000 mg | ORAL_CAPSULE | Freq: Two times a day (BID) | ORAL | 0 refills | Status: DC

## 2019-12-17 MED ORDER — CYCLOBENZAPRINE HCL 10 MG PO TABS
10.0000 mg | ORAL_TABLET | Freq: Two times a day (BID) | ORAL | Status: DC | PRN
  Administered 2019-12-17: 10 mg via ORAL
  Filled 2019-12-17: qty 1

## 2019-12-17 MED ORDER — IBUPROFEN 200 MG PO TABS
600.0000 mg | ORAL_TABLET | Freq: Four times a day (QID) | ORAL | Status: DC | PRN
  Administered 2019-12-17: 600 mg via ORAL
  Filled 2019-12-17: qty 3

## 2019-12-17 MED ORDER — ACETAMINOPHEN 325 MG PO TABS: 650.0000 mg | ORAL_TABLET | ORAL | Status: DC | PRN

## 2019-12-17 NOTE — Consult Note (Addendum)
[facility] ED Discharge  12/17/2019 11:19 AM Chad Bautista  MRN:  [id number] Principal Problem: Alcohol abuse with alcohol-induced mood disorder [facility]) Discharge Diagnoses: Principal Problem:   Alcohol abuse with alcohol-induced mood disorder (HCC)   Subjective: Patient assessed by nurse practitioner.  Patient alert and oriented, answers appropriately.  Patient states on yesterday I had "bad thoughts."  Patient reports triggered by "a lot of pain in my hip."  Patient denies suicidal and homicidal ideations at this time.  Patient denies self-harm.  Patient denies access to weapons.  Patient denies homicidal ideations.  Patient denies hallucinations.  Patient reports history of depression.  Patient reports using alcohol, states "sober living usually lets [place] return after 5 days."  Peers support consult ordered. Patient reports living in [place] in a sober living house and currently working in a warehouse.  Patient states "I live in an apartment with 6 people I hated there anyway."  Patient plans "to get a hotel room when I get my paycheck."  Patient currently Covid positive, encouraged to avoid congregate living at this time. Patient seen along with Dr. Jannifer Franklin. Discharge to follow-up with [facility] outpatient and substance use treatment referrals.  Total Time spent with patient: 30 minutes  Past Psychiatric History: MDD, alcohol use disorder with alcohol-induced mood disorder  Past Medical History:  Past Medical History:  Diagnosis Date  . Hypertension    History reviewed. No pertinent surgical history. Family History: No family history on file. Family Psychiatric  History: Denies Social History:  Social History   Substance and Sexual Activity  Alcohol Use Yes   Comment: amount varies     Social History   Substance and Sexual Activity  Drug Use Yes  . Types: Cocaine   Comment: hx of cocaine use, states that he has been clean five years    Social History   Socioeconomic History   . Marital status: Single    Spouse name: Not on file  . Number of children: Not on file  . Years of education: Not on file  . Highest education level: Not on file  Occupational History  . Not on file  Tobacco Use  . Smoking status: Current Every Day Smoker    Packs/day: 0.50  . Smokeless tobacco: Never Used  Substance and Sexual Activity  . Alcohol use: Yes    Comment: amount varies  . Drug use: Yes    Types: Cocaine    Comment: hx of cocaine use, states that he has been clean five years  . Sexual activity: Not on file  Other Topics Concern  . Not on file  Social History Narrative  . Not on file   Social Determinants of Health   Financial Resource Strain:   . Difficulty of Paying Living Expenses: Not on file  Food Insecurity:   . Worried About Programme researcher, broadcasting/film/video in the Last Year: Not on file  . Ran Out of Food in the Last Year: Not on file  Transportation Needs:   . Lack of Transportation (Medical): Not on file  . Lack of Transportation (Non-Medical): Not on file  Physical Activity:   . Days of Exercise per Week: Not on file  . Minutes of Exercise per Session: Not on file  Stress:   . Feeling of Stress : Not on file  Social Connections:   . Frequency of Communication with Friends and Family: Not on file  . Frequency of Social Gatherings with Friends and Family: Not on file  . Attends Religious  Services: Not on file  . Active Member of Clubs or Organizations: Not on file  . Attends Archivist Meetings: Not on file  . Marital Status: Not on file    Has this patient used any form of tobacco in the last 30 days? (Cigarettes, Smokeless Tobacco, Cigars, and/or Pipes) A prescription for an FDA-approved tobacco cessation medication was offered at discharge and the patient refused  Current Medications: Current Facility-Administered Medications  Medication Dose Route Frequency Provider Last Rate Last Admin  . gabapentin (NEURONTIN) capsule 200 mg  200 mg Oral  BID Darleene Cleaver, Annmargaret Decaprio, MD   200 mg at 12/17/19 1111  . ibuprofen (ADVIL) tablet 600 mg  600 mg Oral Q6H PRN Long, Wonda Olds, MD   600 mg at 12/17/19 1111   Current Outpatient Medications  Medication Sig Dispense Refill  . cyclobenzaprine (FLEXERIL) 10 MG tablet Take 1 tablet (10 mg total) by mouth 2 (two) times daily as needed for muscle spasms. 20 tablet 0  . naproxen (NAPROSYN) 375 MG tablet Take 1 tablet (375 mg total) by mouth 2 (two) times daily. 20 tablet 0  . predniSONE (DELTASONE) 10 MG tablet Take 1 tablet (10 mg total) by mouth daily with breakfast. 10 tablet 0   PTA Medications: (Not in a hospital admission)   Musculoskeletal: Strength & Muscle Tone: within normal limits Gait & Station: normal Patient leans: N/A  Psychiatric Specialty Exam: Physical Exam Vitals and nursing note reviewed.  Constitutional:      Appearance: He is well-developed.  HENT:     Head: Normocephalic.  Cardiovascular:     Rate and Rhythm: Normal rate.  Pulmonary:     Effort: Pulmonary effort is normal.  Neurological:     Mental Status: He is alert and oriented to person, place, and time.  Psychiatric:        Mood and Affect: Mood normal.        Behavior: Behavior normal.        Thought Content: Thought content normal.        Judgment: Judgment normal.     Review of Systems  Constitutional: Negative.   HENT: Negative.   Eyes: Negative.   Respiratory: Negative.   Cardiovascular: Negative.   Gastrointestinal: Negative.   Genitourinary: Negative.   Musculoskeletal: Negative.   Skin: Negative.   Neurological: Negative.     Blood pressure (!) 154/95, pulse 94, temperature 98.3 F (36.8 C), temperature source Oral, resp. rate 15, SpO2 96 %.There is no height or weight on file to calculate BMI.  General Appearance: Casual and Fairly Groomed  Eye Contact:  Good  Speech:  Clear and Coherent and Normal Rate  Volume:  Normal  Mood:  Euthymic  Affect:  Appropriate and Congruent  Thought  Process:  Coherent, Goal Directed and Descriptions of Associations: Intact  Orientation:  Full (Time, Place, and Person)  Thought Content:  WDL and Logical  Suicidal Thoughts:  No  Homicidal Thoughts:  No  Memory:  Immediate;   Good Recent;   Good Remote;   Good  Judgement:  Fair  Insight:  Fair  Psychomotor Activity:  Normal  Concentration:  Concentration: Good and Attention Span: Good  Recall:  [employer] of Knowledge:  Fair  Language:  Fair  Akathisia:  No  Handed:  Right  AIMS (if indicated):     Assets:  Communication Skills Desire for Improvement Financial Resources/Insurance Housing Social Support  ADL's:  Intact  Cognition:  WNL  Sleep:  Demographic Factors:  Male and Caucasian  Loss Factors: NA  Historical Factors: NA  Risk Reduction Factors:   Living with another person, especially a relative, Positive social support, Positive therapeutic relationship and Positive coping skills or problem solving skills  Continued Clinical Symptoms:  Alcohol/Substance Abuse/Dependencies  Cognitive Features That Contribute To Risk:  None    Suicide Risk:  Minimal: No identifiable suicidal ideation.  Patients presenting with no risk factors but with morbid ruminations; may be classified as minimal risk based on the severity of the depressive symptoms    Plan Of Care/Follow-up recommendations:  Other:  Follow-up with [facility] outpatient psychiatry and substance use treatment resources  Disposition: Discharge Patrcia Dolly, FNP 12/17/2019, 11:19 AM  Patient seen face-to-face for psychiatric evaluation, chart reviewed and case discussed with the physician extender and developed treatment plan. Reviewed the information documented and agree with the treatment plan. Thedore Mins, MD

## 2019-12-17 NOTE — ED Provider Notes (Signed)
The patient returned to the ED after being discharged earlier today.  He had been sent to a shelter however there was no room for him there.  He was thereupon sent back here.  Requested callback from the transition of care team, at 5:47 PM.  I was informed that no one is here until 8 AM, to answer that call.  Patient currently does not have anywhere to go tonight.  We will have social work see him in the morning.   Mancel Bale, MD 12/17/19 [phone]

## 2019-12-17 NOTE — ED Notes (Signed)
[facility] contacted to transfer patient to [facility].

## 2019-12-17 NOTE — ED Notes (Signed)
Pt was encouraged to provide urine specimen.

## 2019-12-17 NOTE — ED Triage Notes (Signed)
This patient was sent to [facility] this afternoon by social work.  Social Work also set up [facility] to transport.  PTAR was sent away from [facility] as noone called them and they have no room.  So patient is here and covid positive.

## 2019-12-17 NOTE — Patient Outreach (Signed)
CPSS provided the patient's nurse with information for substance use recovery resources to give to the patient. Some of these substance use recovery resource information includes a residential/outpatient substance use treatment center list, detox center list, NA/AA online/in-person meeting list, [facility] house vacancy list/flier detailing [employer], and CPSS contact information. This resource information also included written instructions on how to contact [facility]PSS if needed for further substance use recovery support or questions about how to get connected to substance use recovery resources. Per chart review, patient has a history of poly substance use with alcohol and cocaine use. UDS was not completed. Per TTS assessment on 12/16/19, patient stated he stopped using cocaine 5 years ago and currently drinks a few beers 2-3 times a week. Patient also stated in the TTS assessment "I have no intentions of stopping drinking."  Patient additionally stated in the TTS assessment that he recently relocated to [place] from [place], [place] to be apart of the [employer] substance use recovery housing program because "I had nowhere to go."

## 2019-12-17 NOTE — ED Notes (Signed)
Pt did not want to leave due to his high blood pressure.  MD notified and I explained to patient that we do not treat BP at discharge due to the risk of hypotension and possible passing out.  Pt was very upset and rude cursing staff. Pt had been discharged for hours and was just awaiting a ride from [place] in which we provided him.

## 2019-12-17 NOTE — Progress Notes (Signed)
Pt is awake in his room at the change of shift. He made a request for several  Medications to be restarted  prior to his discharge earlier today. He remains on isolation. He was medication compliant, received fluids and a snack of his choice. He slept throughout the night without incident.

## 2019-12-17 NOTE — ED Notes (Signed)
Pt is in his room and in no distress.

## 2019-12-17 NOTE — Progress Notes (Addendum)
12pm: CSW spoke with patient again to inform him of barriers of returning to [facility] at this time. Patient stated agreement to [facility] to [facility] for shelter placement.  CSW updated RN.  11am: CSW left voicemail with [facility] to attempt to find shelter placement for this patient.   CSW left voicemail with [facility] in [place] to attempt to find shelter placement for this patient.  CSW spoke with patient via phone to discuss his discharge plan. Patient reports being discharged from [facility] with no other plan for shelter. 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 [facility] [place].  CSW spoke with BJ from [facility] who states this patient was discharged from the program yesterday after consuming alcohol. Patient can not return to [facility] until January 6th, 2021.  Edwin Dada, MSW, LCSW-A Transitions of Care  Clinical Social Worker  [facility] Emergency Departments  Medical ICU [phone]

## 2019-12-18 NOTE — Progress Notes (Addendum)
12:40p Patient has been accepted to go to the COVID hotel. Patient will be transported by [facility]. Health Department.   11:20a CSW spoke with patient via phone. Patient reports he understands that he will have to quarantine in the hotel hotel and can leave once he is cleared. Patient understands that this is for his safety and the safety of others to not spread COVID19. CSW received a call from [facility] reports they will ensure patient has homeless shelter placement after quarantine in case him not being ableto go back to [facility] [place] does not work out.   9:01a CSW aware of consult. CSW has contacted [facility] to see if patient can be admitted into the quarantine hotel program. CSW will speak with patient about this program to ensure he understands that he can not come and go from the hotel. CSW will continue to follow up.   Geralyn Corwin, LCSW Transitions of Care Department [facility] ED [phone]

## 2019-12-18 NOTE — ED Notes (Signed)
Pt resting comfortably

## 2019-12-18 NOTE — Discharge Instructions (Signed)
You are being sent to a Covid hospital.  Please quarantine as detailed in this packet.  Follow-up with Lavinia Sharps for reevaluation.  Return to the emergency department if any concerning signs or symptoms develop such as severe shortness of breath, chest pains, persistent vomiting, or loss of consciousness.

## 2019-12-18 NOTE — ED Notes (Signed)
Pt DCd off unit to COVID hotel per provider and SW. Pt alert, calm, cooperative, no s/s of distress. DC information given to and review with pt. Belongings given to pt. Pt ambulatory off the unit, escorted by [facility]. Pt transported to [facility] per SW.

## 2019-12-19 NOTE — ED Provider Notes (Signed)
[facility] DEPT Provider Note   CSN: [id number] Arrival date & time: 12/16/19  0830     History Chief Complaint  Patient presents with  . Hip Pain    Chad Bautista is a 44 y.o. male.  Chief complaint right hip pain.  Patient states pain initially started many years ago after a fall from a roof.  No prior hip surgery.  No recent trauma.  No other obvious complaints.  Severity is moderate.  Ambulation and range of motion makes pain worse.        Past Medical History:  Diagnosis Date  . Hypertension     Patient Active Problem List   Diagnosis Date Noted  . Alcohol abuse with alcohol-induced mood disorder ([place]) 12/17/2019    History reviewed. No pertinent surgical history.     No family history on file.  Social History   Tobacco Use  . Smoking status: Current Every Day Smoker    Packs/day: 0.50  . Smokeless tobacco: Never Used  Substance Use Topics  . Alcohol use: Yes    Comment: amount varies  . Drug use: Yes    Types: Cocaine    Comment: hx of cocaine use, states that he has been clean five years    Home Medications Prior to Admission medications   Medication Sig Start Date End Date Taking? Authorizing Provider  cyclobenzaprine (FLEXERIL) 10 MG tablet Take 1 tablet (10 mg total) by mouth 2 (two) times daily as needed for muscle spasms. 12/16/19   Nat Christen, MD  gabapentin (NEURONTIN) 100 MG capsule Take 2 capsules (200 mg total) by mouth 2 (two) times daily. 12/17/19   Emmaline Kluver, FNP  naproxen (NAPROSYN) 375 MG tablet Take 1 tablet (375 mg total) by mouth 2 (two) times daily. 12/16/19   Nat Christen, MD  predniSONE (DELTASONE) 10 MG tablet Take 1 tablet (10 mg total) by mouth daily with breakfast. 12/16/19   Nat Christen, MD    Allergies    Patient has no known allergies.  Review of Systems   Review of Systems  All other systems reviewed and are negative.   Physical Exam Updated Vital Signs BP (!) 148/105 (BP Location:  Left Arm)   Pulse (!) 103   Temp 97.6 F (36.4 C) (Oral)   Resp 16   SpO2 100%   Physical Exam Vitals and nursing note reviewed.  Constitutional:      Appearance: He is well-developed.  HENT:     Head: Normocephalic and atraumatic.  Eyes:     Conjunctiva/sclera: Conjunctivae normal.  Cardiovascular:     Rate and Rhythm: Normal rate and regular rhythm.  Pulmonary:     Effort: Pulmonary effort is normal.     Breath sounds: Normal breath sounds.  Abdominal:     General: Bowel sounds are normal.     Palpations: Abdomen is soft.  Musculoskeletal:     Cervical back: Neck supple.     Comments: Right hip: Tender to palpation laterally.  Pain with range of motion.  Skin:    General: Skin is warm and dry.  Neurological:     General: No focal deficit present.     Mental Status: He is alert and oriented to person, place, and time.  Psychiatric:        Behavior: Behavior normal.     ED Results / Procedures / Treatments   Labs (all labs ordered are listed, but only abnormal results are displayed) Labs Reviewed - No data to  display  EKG None  Radiology No results found.  Procedures Procedures (including critical care time)  Medications Ordered in ED Medications  dexamethasone (DECADRON) injection 8 mg (8 mg Intramuscular Given 12/16/19 1041)    ED Course  I have reviewed the triage vital signs and the nursing notes.  Pertinent labs & imaging results that were available during my care of the patient were reviewed by me and considered in my medical decision making (see chart for details).    MDM Rules/Calculators/A&P                      Patient presents with chronic right hip pain.  X-ray shows severe degeneration of the hip along with possible early AVN.  Decadron 8 mg IM in ED.  Discharge medications Naprosyn 375 mg.  Flexeril 10 mg, prednisone.  Referral to orthopedics. Final Clinical Impression(s) / ED Diagnoses Final diagnoses:  Depression, unspecified depression  type  Chronic right hip pain    Rx / DC Orders ED Discharge Orders         Ordered    naproxen (NAPROSYN) 375 MG tablet  2 times daily     12/16/19 1026    cyclobenzaprine (FLEXERIL) 10 MG tablet  2 times daily PRN     12/16/19 1026    predniSONE (DELTASONE) 10 MG tablet  Daily with breakfast     12/16/19 1026           Donnetta Hutching, MD 12/19/19 1047

## 2020-01-04 ENCOUNTER — Encounter: Payer: Self-pay | Admitting: Critical Care Medicine

## 2020-01-04 ENCOUNTER — Telehealth: Payer: Self-pay | Admitting: General Practice

## 2020-01-04 ENCOUNTER — Other Ambulatory Visit: Payer: Self-pay | Admitting: Critical Care Medicine

## 2020-01-04 DIAGNOSIS — M87051 Idiopathic aseptic necrosis of right femur: Secondary | ICD-10-CM

## 2020-01-04 MED ORDER — OXYCODONE-ACETAMINOPHEN 5-300 MG PO TABS: 1.0000 | ORAL_TABLET | ORAL | 0 refills | Status: DC | PRN

## 2020-01-04 MED ORDER — OXYCODONE-ACETAMINOPHEN 5-325 MG PO TABS: 1.0000 | ORAL_TABLET | ORAL | 0 refills | Status: DC | PRN

## 2020-01-04 MED ORDER — LOSARTAN POTASSIUM-HCTZ 100-25 MG PO TABS: 1.0000 | ORAL_TABLET | Freq: Every day | ORAL | 0 refills | Status: DC

## 2020-01-04 MED ORDER — MELOXICAM 15 MG PO TABS: 15.0000 mg | ORAL_TABLET | Freq: Every day | ORAL | 0 refills | Status: DC

## 2020-01-04 MED FILL — LOSARTAN-HCTZ 100-25 MG TAB: 100-25 | 30 days supply | Qty: 30 | Fill #0

## 2020-01-04 MED FILL — MELOXICAM 15 MG TABLET: 15 | 30 days supply | Qty: 30 | Fill #0

## 2020-01-04 NOTE — Telephone Encounter (Signed)
oxycodone-acetaminophen (LYNOX) 5-300 MG tablet   Alicia from [facility] called stating they do not have the dose.  They carry 5-25 please correct and resend. Phone# [phone]

## 2020-01-04 NOTE — Congregational Nurse Program (Signed)
Chad Bautista was seen at clinic this PM with Dr. Delford Field for c/o R hip pain. Dr. Delford Field will schedule appointment for client with [facility]. Medication RX sent to [facility], will be picked up by CN and delivered directly to client.

## 2020-01-04 NOTE — Progress Notes (Signed)
meds for hip and for HTN

## 2020-01-05 MED FILL — OXYCODONE-ACETAMINOPHEN 5-3: 5-325 | 3 days supply | Qty: 20 | Fill #0

## 2020-01-05 NOTE — Progress Notes (Signed)
Patient ID: Chad Bautista, male   DOB: 29-Apr-1976, 44 y.o.   MRN: [id number] This is a 44 year old male who just arrived at the shelter less than a week ago.  He was released November 23 from incarceration.  He initially stayed in the [facility] area.  He is originally from [place] at [place] [employer] area.  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  The patient's had progressive right hip pain and had been attempting to get a hip replacement however the patient has not been able to achieve this.  He did go to the emergency room on 20 December and [facility] copies of his x-rays do show severe degeneration of the right hip with avascular necrosis he certainly appears to qualify for right hip replacement.  He does have chronic pain he states high-dose Aleve will help to some degree.  He did go to the emergency room 1 January here locally and was prescribed Naprosyn and given Toradol injection.  He states this helped to some degree.  He was given that count of 25 mg Percocets December 24 and fill those here locally.  On exam blood pressure is 180/92 pulse is 94 saturation 97% chest showed to be clear without amounts of wheeze or rhonchi cardiac exam showed a regular rate and rhythm no S3 no S4 abdomen soft nontender right hip showed significant point tenderness at the point of the femoral head insertion into the pelvis area  Impression is that of #1 avascular necrosis of the right hip for this will make a referral to orthopedics for further evaluation We will give the patient a 20 count of Percocet 5 mg/325 acetaminophen and prescribe meloxicam 15 mg daily  #2 significant hypertension for this will begin losartan HCT 100/25  We will get the patient a return visit in the office to establish for primary care for hypertension  I did check the [facility] drug database and the  only prescriptions are in [place] and [place] area in December for the Percocets and they were short-term prescriptions only

## 2020-01-11 ENCOUNTER — Other Ambulatory Visit: Payer: Self-pay | Admitting: Critical Care Medicine

## 2020-01-11 ENCOUNTER — Encounter: Payer: Self-pay | Admitting: Critical Care Medicine

## 2020-01-11 MED ORDER — OXYCODONE-ACETAMINOPHEN 5-325 MG PO TABS: 1.0000 | ORAL_TABLET | Freq: Three times a day (TID) | ORAL | 0 refills | Status: DC | PRN

## 2020-01-11 MED ORDER — IBUPROFEN 600 MG PO TABS: 600.0000 mg | ORAL_TABLET | Freq: Three times a day (TID) | ORAL | 0 refills | Status: DC | PRN

## 2020-01-11 NOTE — Congregational Nurse Program (Signed)
Chad Bautista was seen in walk-in clinic this afternoon for c/o Rt hip pain. Ortho Care appointment 2/9@2 :30p CHW 2/3@9 :30a CN will pick up medication at [facility]

## 2020-01-11 NOTE — Progress Notes (Signed)
refills  

## 2020-01-12 NOTE — Progress Notes (Signed)
Patient ID: Chad Bautista, male   DOB: 1976/02/06, 44 y.o.   MRN: [id number] This is a 44 year old male seen in the [facility] clinic in follow-up from a 1 week ago visit.  He has severe avascular necrosis and arthritis in the right hip and needs orthopedic evaluation for possible hip replacement.  He has already taken all 20 of the oxycodones I gave him last week.  He was taking these on every 6 hour basis.  He does have an established appointment with orthopedics on February 9 at 2:30 PM he complains of coldness in the right leg  He complains of bilateral foot pain  On exam blood pressure 130/84 the hip and right leg have good circulation good pulses however both feet show significant toe injury and upon review he is wearing a 10-1/2 tennis shoe when he should be on a size 11-1/2.  Plan will be to get him to follow-up and get to his orthopedic appointment on February 9 we will also obtain for him a size 11-1/2 tennis shoe and as well we confirmed he is on his blood pressure medication losartan HCT and his blood pressure is much improved at this visit  He will have an established follow-up appointment in our office at the clinic for lab data and further evaluations on February 3

## 2020-01-18 ENCOUNTER — Encounter: Payer: Self-pay | Admitting: Critical Care Medicine

## 2020-01-18 ENCOUNTER — Ambulatory Visit: Payer: Self-pay | Attending: Critical Care Medicine | Admitting: Critical Care Medicine

## 2020-01-18 ENCOUNTER — Other Ambulatory Visit: Payer: Self-pay

## 2020-01-18 VITALS — BP 149/98 | HR 96 | Temp 97.8°F | Resp 17 | Ht 74.4 in | Wt 254.0 lb

## 2020-01-18 DIAGNOSIS — F1090 Alcohol use, unspecified, uncomplicated: Secondary | ICD-10-CM | POA: Insufficient documentation

## 2020-01-18 DIAGNOSIS — I10 Essential (primary) hypertension: Secondary | ICD-10-CM

## 2020-01-18 DIAGNOSIS — F331 Major depressive disorder, recurrent, moderate: Secondary | ICD-10-CM

## 2020-01-18 DIAGNOSIS — M87051 Idiopathic aseptic necrosis of right femur: Secondary | ICD-10-CM | POA: Insufficient documentation

## 2020-01-18 DIAGNOSIS — F17200 Nicotine dependence, unspecified, uncomplicated: Secondary | ICD-10-CM

## 2020-01-18 DIAGNOSIS — Z114 Encounter for screening for human immunodeficiency virus [HIV]: Secondary | ICD-10-CM

## 2020-01-18 DIAGNOSIS — M1651 Unilateral post-traumatic osteoarthritis, right hip: Secondary | ICD-10-CM

## 2020-01-18 DIAGNOSIS — Z789 Other specified health status: Secondary | ICD-10-CM | POA: Insufficient documentation

## 2020-01-18 DIAGNOSIS — Z7289 Other problems related to lifestyle: Secondary | ICD-10-CM

## 2020-01-18 MED ORDER — SERTRALINE HCL 50 MG PO TABS: 50.0000 mg | ORAL_TABLET | Freq: Every day | ORAL | 3 refills | Status: DC

## 2020-01-18 MED ORDER — MELOXICAM 15 MG PO TABS: 15.0000 mg | ORAL_TABLET | Freq: Every day | ORAL | 3 refills | Status: DC

## 2020-01-18 MED ORDER — LOSARTAN POTASSIUM-HCTZ 100-25 MG PO TABS: 1.0000 | ORAL_TABLET | Freq: Every day | ORAL | 1 refills | Status: DC

## 2020-01-18 MED FILL — SERTRALINE HCL 50 MG TABS: 50 | 30 days supply | Qty: 30 | Fill #0

## 2020-01-18 NOTE — Assessment & Plan Note (Signed)
We discussed briefly reducing tobacco consumption would help his blood pressure and other health concerns he will work on this and at the same time needs to focus on his alcohol use first

## 2020-01-18 NOTE — Patient Instructions (Addendum)
Start sertraline 1 daily for depression and anxiety  Refills on meloxicam was sent to our pharmacy  Refills on losartan HCT sent to our pharmacy  Labs today include HIV metabolic panel vitamin D level blood counts  Keep your appointment with orthopedics  An appointment with Leavy Cella our licensed clinical social worker we made regarding your depression and alcohol use  An appointment can be made to visit with our financial counselor   return to see Dr. Delford Field in 2 months

## 2020-01-18 NOTE — Assessment & Plan Note (Signed)
Alcohol use with alcohol-induced mood disorder again needs to focus on's alcohol cessation at this time

## 2020-01-18 NOTE — Assessment & Plan Note (Signed)
Hypertension under improved control for now we will maintain losartan HCT 100/25 daily and follow-up with a metabolic profile and thyroid panel at this visit along with lipid panel

## 2020-01-18 NOTE — Assessment & Plan Note (Signed)
Major depressive disorder  We will begin sertraline 50 mg daily and have the patient achieve an appoint with our licensed clinical social worker

## 2020-01-18 NOTE — Progress Notes (Signed)
Subjective:    Patient ID: Chad Bautista, male    DOB: [birth year], 44 y.o.   MRN: [id number]  This is a 44 year old white male history of hypertension alcohol use and severe degenerative joint disease of the right hip with avascular necrosis femoral head status post trauma after falling off a roof many years ago. The patient currently is homeless and just recently was released from the criminal justice system in prison.  He was in the justice system due to multiple DUIs and trying to allude a police chase while intoxicated.  Patient does have a lifelong history of stress and anxiety and grew up in a home in which his father left him in early age she does not communicate with the father.  The patient currently is at the [facility] homeless shelter but is working on a potential apartment within the next 30 days.  Patient does smoke a half a pack a day of cigarettes.  He states he drinks about occasional can of beer 2 or 3 days a week.  When I met the patient the homeless shelter we initiated losartan HCT for hypertension and did obtain for the patient orthopedic appointment.  He has the upcoming appointment on February 8 for orthopedics.  Pain has been quite severe and has been managing this with meloxicam daily and I did give him a very short course of opioids to use only as needed.  He has not been overusing these opioids and [employer] showed no other prescribers.  The patient has no other medical concerns at this visit.  He is here for additional blood work and to establish a primary care     Past Medical History:  Diagnosis Date  . Hypertension      Family History  Problem Relation Age of Onset  . Heart disease Mother      Social History   Socioeconomic History  . Marital status: Single    Spouse name: Not on file  . Number of children: Not on file  . Years of education: Not on file  . Highest education level: Not on file  Occupational History  . Not on file   Tobacco Use  . Smoking status: Current Every Day Smoker    Packs/day: 0.50  . Smokeless tobacco: Never Used  Substance and Sexual Activity  . Alcohol use: Yes    Comment: amount varies  . Drug use: Not Currently    Types: Cocaine    Comment: hx of cocaine use, states that he has been clean five years  . Sexual activity: Not Currently  Other Topics Concern  . Not on file  Social History Narrative  . Not on file   Social Determinants of Health   Financial Resource Strain:   . Difficulty of Paying Living Expenses: Not on file  Food Insecurity:   . Worried About Charity fundraiser in the Last Year: Not on file  . Ran Out of Food in the Last Year: Not on file  Transportation Needs:   . Lack of Transportation (Medical): Not on file  . Lack of Transportation (Non-Medical): Not on file  Physical Activity:   . Days of Exercise per Week: Not on file  . Minutes of Exercise per Session: Not on file  Stress:   . Feeling of Stress : Not on file  Social Connections:   . Frequency of Communication with Friends and Family: Not on file  . Frequency of Social Gatherings with Friends and Family: Not  on file  . Attends Religious Services: Not on file  . Active Member of Clubs or Organizations: Not on file  . Attends Archivist Meetings: Not on file  . Marital Status: Not on file  Intimate Partner Violence:   . Fear of Current or Ex-Partner: Not on file  . Emotionally Abused: Not on file  . Physically Abused: Not on file  . Sexually Abused: Not on file     Allergies  Allergen Reactions  . Tramadol     Seizure     Outpatient Medications Prior to Visit  Medication Sig Dispense Refill  . ibuprofen (ADVIL) 600 MG tablet Take 1 tablet (600 mg total) by mouth every 8 (eight) hours as needed. 30 tablet 0  . oxyCODONE-acetaminophen (PERCOCET/ROXICET) 5-325 MG tablet Take 1 tablet by mouth every 8 (eight) hours as needed for severe pain. 20 tablet 0  .  losartan-hydrochlorothiazide (HYZAAR) 100-25 MG tablet Take 1 tablet by mouth daily. 30 tablet 0  . meloxicam (MOBIC) 15 MG tablet Take 1 tablet (15 mg total) by mouth daily. 30 tablet 0   No facility-administered medications prior to visit.      Review of Systems  Constitutional: Positive for fatigue. Negative for fever.  HENT: Negative.   Respiratory: Negative for cough, shortness of breath and wheezing.   Cardiovascular: Positive for leg swelling. Negative for palpitations.  Gastrointestinal: Negative.   Genitourinary: Negative.   Musculoskeletal:       R hip pain and shoulder pain   Neurological: Negative for dizziness and weakness.  Psychiatric/Behavioral: Positive for dysphoric mood and sleep disturbance. Negative for behavioral problems, decreased concentration and self-injury. The patient is nervous/anxious.        Objective:   Physical Exam  Vitals:   01/18/20 0908  BP: (!) 149/98  Pulse: 96  Resp: 17  Temp: 97.8 F (36.6 C)  TempSrc: Temporal  SpO2: 97%  Weight: 254 lb (115.2 kg)  Height: 6' 2.4" (1.89 m)    Gen: Pleasant, well-nourished, in no distress,  normal affect  ENT: No lesions,  mouth clear,  oropharynx clear, no postnasal drip  Neck: No JVD, no TMG, no carotid bruits  Lungs: No use of accessory muscles, no dullness to percussion, clear without rales or rhonchi  Cardiovascular: RRR, heart sounds normal, no murmur or gallops, no peripheral edema  Abdomen: soft and NT, no HSM,  BS normal  Musculoskeletal: No deformities, no cyanosis or clubbing  Neuro: alert, non focal  Skin: Warm, no lesions or rashes      Assessment & Plan:  I personally reviewed all images and lab data in the [facility] system as well as any outside material available during this office visit and agree with the  radiology impressions.   Essential hypertension Hypertension under improved control for now we will maintain losartan HCT 100/25 daily and follow-up with a metabolic  profile and thyroid panel at this visit along with lipid panel  Moderate episode of recurrent major depressive disorder ([place]) Major depressive disorder  We will begin sertraline 50 mg daily and have the patient achieve an appoint with our licensed clinical social worker  Post-traumatic osteoarthritis of right hip Posttraumatic osteoarthritis of the right hip with avascular necrosis right femoral head  The patient is to continue the meloxicam daily and I have recommended he keep his appointment with orthopedics upcoming  Alcohol use Alcohol use with alcohol-induced mood disorder again needs to focus on's alcohol cessation at this time  Tobacco use disorder We  discussed briefly reducing tobacco consumption would help his blood pressure and other health concerns he will work on this and at the same time needs to focus on his alcohol use first   Diagnoses and all orders for this visit:  Essential hypertension -     Comprehensive metabolic panel -     Thyroid Panel With TSH -     Lipid panel -     CBC with Differential/Platelet; Future  Post-traumatic osteoarthritis of right hip -     VITAMIN D 25 Hydroxy (Vit-D Deficiency, Fractures)  Encounter for screening for HIV -     HIV Antibody (routine testing w rflx)  Alcohol use -     Comprehensive metabolic panel -     CBC with Differential/Platelet; Future  Tobacco use disorder  Moderate episode of recurrent major depressive disorder (HCC)  Other orders -     meloxicam (MOBIC) 15 MG tablet; Take 1 tablet (15 mg total) by mouth daily. -     sertraline (ZOLOFT) 50 MG tablet; Take 1 tablet (50 mg total) by mouth daily. -     losartan-hydrochlorothiazide (HYZAAR) 100-25 MG tablet; Take 1 tablet by mouth daily.    A screening HIV study will be obtained

## 2020-01-18 NOTE — Progress Notes (Signed)
Here for f /u  Did not take BP meds today

## 2020-01-18 NOTE — Assessment & Plan Note (Signed)
Posttraumatic osteoarthritis of the right hip with avascular necrosis right femoral head  The patient is to continue the meloxicam daily and I have recommended he keep his appointment with orthopedics upcoming

## 2020-01-19 ENCOUNTER — Encounter: Payer: Self-pay | Admitting: Critical Care Medicine

## 2020-01-19 ENCOUNTER — Telehealth (INDEPENDENT_AMBULATORY_CARE_PROVIDER_SITE_OTHER): Payer: Self-pay

## 2020-01-19 ENCOUNTER — Other Ambulatory Visit: Payer: Self-pay | Admitting: Critical Care Medicine

## 2020-01-19 DIAGNOSIS — E559 Vitamin D deficiency, unspecified: Secondary | ICD-10-CM | POA: Insufficient documentation

## 2020-01-19 LAB — VITAMIN D 25 HYDROXY (VIT D DEFICIENCY, FRACTURES): Vit D, 25-Hydroxy: 20.9 ng/mL — ABNORMAL LOW (ref 30.0–100.0)

## 2020-01-19 LAB — LIPID PANEL
Chol/HDL Ratio: 4.1 ratio (ref 0.0–5.0)
Cholesterol, Total: 147 mg/dL (ref 100–199)
HDL: 36 mg/dL — ABNORMAL LOW (ref >39)
LDL Chol Calc (NIH): 66 mg/dL (ref 0–99)
Triglycerides: 279 mg/dL — ABNORMAL HIGH (ref 0–149)
VLDL Cholesterol Cal: 45 mg/dL — ABNORMAL HIGH (ref 5–40)

## 2020-01-19 LAB — COMPREHENSIVE METABOLIC PANEL
ALT: 41 IU/L (ref 0–44)
AST: 37 IU/L (ref 0–40)
Albumin/Globulin Ratio: 1.5 (ref 1.2–2.2)
Albumin: 4 g/dL (ref 4.0–5.0)
Alkaline Phosphatase: 67 IU/L (ref 39–117)
BUN/Creatinine Ratio: 17 (ref 9–20)
BUN: 17 mg/dL (ref 6–24)
Bilirubin Total: 0.2 mg/dL (ref 0.0–1.2)
CO2: 26 mmol/L (ref 20–29)
Calcium: 9.8 mg/dL (ref 8.7–10.2)
Chloride: 99 mmol/L (ref 96–106)
Creatinine, Ser: 0.98 mg/dL (ref 0.76–1.27)
GFR calc Af Amer: 108 mL/min/{1.73_m2} (ref >59)
GFR calc non Af Amer: 93 mL/min/{1.73_m2} (ref >59)
Globulin, Total: 2.7 g/dL (ref 1.5–4.5)
Glucose: 115 mg/dL — ABNORMAL HIGH (ref 65–99)
Potassium: 4.6 mmol/L (ref 3.5–5.2)
Sodium: 139 mmol/L (ref 134–144)
Total Protein: 6.7 g/dL (ref 6.0–8.5)

## 2020-01-19 LAB — THYROID PANEL WITH TSH
Free Thyroxine Index: 1.9 (ref 1.2–4.9)
T3 Uptake Ratio: 23 % — ABNORMAL LOW (ref 24–39)
T4, Total: 8.4 ug/dL (ref 4.5–12.0)
TSH: 1.75 u[IU]/mL (ref 0.450–4.500)

## 2020-01-19 LAB — HIV ANTIBODY (ROUTINE TESTING W REFLEX): HIV Screen 4th Generation wRfx: NONREACTIVE

## 2020-01-19 MED ORDER — VITAMIN D (ERGOCALCIFEROL) 1.25 MG (50000 UNIT) PO CAPS: 50000.0000 [IU] | ORAL_CAPSULE | ORAL | 5 refills | Status: DC

## 2020-01-19 MED FILL — VIT D2 1.25 MG (50,000 UNIT: 1.25 MG | 35 days supply | Qty: 5 | Fill #0

## 2020-01-19 NOTE — Telephone Encounter (Signed)
Patient verified date of birth. He is aware that his vitamin D is very low and I sent a prescription to our pharmacy for supplementation, also let him know his triglycerides are elevated he needs to try to avoid fatty foods but his total cholesterol is normal  Let him know his thyroid function is normal metabolic panel normal and his HIV is negative. He verbalized understanding of results. Maryjean Morn, CMA

## 2020-01-19 NOTE — Telephone Encounter (Signed)
-----   Message from Storm Frisk, MD sent at 01/19/2020 11:55 AM EST ----- Please call Mr. Gapinski and let him know his vitamin D is very low and I sent a prescription to our pharmacy for supplementation, also let him know his triglycerides are elevated he needs to try to avoid fatty foods but his total cholesterol is normalLet him know his thyroid function is normal metabolic panel normal and his HIV is negative

## 2020-01-24 ENCOUNTER — Other Ambulatory Visit: Payer: Self-pay

## 2020-01-24 ENCOUNTER — Ambulatory Visit (INDEPENDENT_AMBULATORY_CARE_PROVIDER_SITE_OTHER): Payer: Self-pay | Admitting: Orthopaedic Surgery

## 2020-01-24 ENCOUNTER — Encounter: Payer: Self-pay | Admitting: Orthopaedic Surgery

## 2020-01-24 DIAGNOSIS — M87051 Idiopathic aseptic necrosis of right femur: Secondary | ICD-10-CM

## 2020-01-24 MED ORDER — IBUPROFEN 600 MG PO TABS: 600.0000 mg | ORAL_TABLET | Freq: Three times a day (TID) | ORAL | 0 refills | Status: DC | PRN

## 2020-01-24 MED ORDER — ACETAMINOPHEN-CODEINE #3 300-30 MG PO TABS: 1.0000 | ORAL_TABLET | Freq: Three times a day (TID) | ORAL | 2 refills | Status: DC | PRN

## 2020-01-24 NOTE — Progress Notes (Signed)
Office Visit Note   Patient: Chad Bautista           Date of Birth: 12/06/1976           MRN: [id number] Visit Date: 01/24/2020              Requested by: Storm Frisk, MD [address] [place],  [place] [address] PCP: Storm Frisk, MD   Assessment & Plan: Visit Diagnoses:  1. Avascular necrosis of femur head, right (HCC)     Plan: Impression is right hip avascular necrosis with femoral head collapse with secondary DJD.  Based on discussion of nonsurgical vs surgical treatment options, patient has elected to proceed with a right total hip replacement.  Due to a lack of stable housing at the moment, we have recommended that we postpone surgery until he obtains stable housing so that he can recover in a good environment.  He has 2 adult sons who can assist him postoperatively for at least 2 weeks.  Risks and alternatives to surgery were discussed as well today.  All questions answered.  Patient will call us back once he is ready for surgery. Total face to face encounter time was greater than 45 minutes and over half of this time was spent in counseling and/or coordination of care.   Follow-Up Instructions: Return for post-op.   Orders:  No orders of the defined types were placed in this encounter.  Meds ordered this encounter  Medications  . ibuprofen (ADVIL) 600 MG tablet    Sig: Take 1 tablet (600 mg total) by mouth every 8 (eight) hours as needed.    Dispense:  30 tablet    Refill:  0  . acetaminophen-codeine (TYLENOL #3) 300-30 MG tablet    Sig: Take 1-2 tablets by mouth 3 (three) times daily as needed for moderate pain.    Dispense:  30 tablet    Refill:  2      Procedures: No procedures performed   Clinical Data: No additional findings.   Subjective: Chief Complaint  Patient presents with  . Right Hip - Pain    HPI patient is a pleasant 44 year old gentleman who comes in today with complaints of right hip pain.  This has been ongoing for the  past 3 years and is progressively worsened.  No specific injury or change in activity.  He did have recent x-rays which showed avascular necrosis of the right femoral head.  He does have a history of alcoholism but notes that he has been sober since before being incarcerated for a year.  The pain he has is to the groin, anterior medial thigh.  He notes that this is constant but worse with ambulation as well as with sleeping.  He has been taking oxycodone and Motrin without moderate relief of symptoms.  No previous cortisone injection to the right hip joint.  Review of Systems as detailed in HPI.  All others reviewed and are negative.   Objective: Vital Signs: There were no vitals taken for this visit.  Physical Exam well-developed well-nourished gentleman in no acute distress.  Alert and oriented x3.  Ortho Exam examination of the right hip reveals a markedly positive logroll with very little if any rotation.  Positive [place].  He is neurovascular intact distally.  Specialty Comments:  No specialty comments available.  Imaging: No new imaging   PMFS History: Patient Active Problem List   Diagnosis Date Noted  . Vitamin D deficiency 01/19/2020  . Tobacco use  disorder 01/18/2020  . Alcohol use 01/18/2020  . Post-traumatic osteoarthritis of right hip 01/18/2020  . Essential hypertension 01/18/2020  . Encounter for screening for HIV 01/18/2020  . Moderate episode of recurrent major depressive disorder ([place]) 01/18/2020  . Alcohol abuse with alcohol-induced mood disorder ([place]) 12/17/2019   Past Medical History:  Diagnosis Date  . Hypertension     Family History  Problem Relation Age of Onset  . Heart disease Mother     Past Surgical History:  Procedure Laterality Date  . APPENDECTOMY    . TONSILLECTOMY     Social History   Occupational History  . Not on file  Tobacco Use  . Smoking status: Current Every Day Smoker    Packs/day: 0.50  . Smokeless tobacco: Never Used    Substance and Sexual Activity  . Alcohol use: Yes    Comment: amount varies  . Drug use: Not Currently    Types: Cocaine    Comment: hx of cocaine use, states that he has been clean five years  . Sexual activity: Not Currently

## 2020-01-25 ENCOUNTER — Ambulatory Visit: Payer: Self-pay | Admitting: Licensed Clinical Social Worker

## 2020-01-27 ENCOUNTER — Telehealth: Payer: Self-pay | Admitting: Licensed Clinical Social Worker

## 2020-01-27 NOTE — Telephone Encounter (Signed)
MSW intern placed call to patient to follow up regarding rescheduling behavioral health consult [facility]) scheduled 01/25/20. When call was placed 01/25/20 to meet with patient for telephonic [facility], patient was unable to talk. Patient did not answer, left voicemail requesting return call.

## 2020-02-01 ENCOUNTER — Telehealth: Payer: Self-pay | Admitting: Orthopaedic Surgery

## 2020-02-01 ENCOUNTER — Emergency Department (HOSPITAL_COMMUNITY)
Admission: EM | Admit: 2020-02-01 | Discharge: 2020-02-01 | Disposition: A | Discharge status: Home / Self Care | Payer: Medicaid Other | Attending: Emergency Medicine | Admitting: Emergency Medicine

## 2020-02-01 ENCOUNTER — Other Ambulatory Visit: Payer: Self-pay

## 2020-02-01 DIAGNOSIS — M1651 Unilateral post-traumatic osteoarthritis, right hip: Secondary | ICD-10-CM | POA: Insufficient documentation

## 2020-02-01 DIAGNOSIS — I10 Essential (primary) hypertension: Secondary | ICD-10-CM | POA: Insufficient documentation

## 2020-02-01 DIAGNOSIS — Z590 Homelessness: Secondary | ICD-10-CM | POA: Insufficient documentation

## 2020-02-01 DIAGNOSIS — Z79899 Other long term (current) drug therapy: Secondary | ICD-10-CM | POA: Insufficient documentation

## 2020-02-01 DIAGNOSIS — M25551 Pain in right hip: Secondary | ICD-10-CM

## 2020-02-01 MED ORDER — HYDROMORPHONE HCL 1 MG/ML IJ SOLN
1.0000 mg | Freq: Once | INTRAMUSCULAR | Status: AC
  Administered 2020-02-01: 1 mg via INTRAMUSCULAR
  Filled 2020-02-01: qty 1

## 2020-02-01 NOTE — ED Notes (Signed)
Pt provided with bus pass, wheeled to ED entrance and provided with directions to get to bus stop.  Pt with no further questions at this time.

## 2020-02-01 NOTE — Telephone Encounter (Signed)
Looks like he went to ED for this today.  We can do tramadol or tylenol 3.  Nothing stronger unfortunately

## 2020-02-01 NOTE — ED Provider Notes (Signed)
[facility] DEPT Provider Note   CSN: [id number] Arrival date & time: 02/01/20  4270     History Chief Complaint  Patient presents with  . Hip Pain    right    Chad Bautista is a 44 y.o. male history of hypertension, right hip arthritis here presenting right hip pain. Patient has been having right hip pain for the last several years.  Patient actually saw orthopedic doctor several days ago and recommended hip replacement due to his bad arthritis on the right hip.  Patient was also given crutches.  Unfortunately the situation is that patient is homeless and is in the process of getting a apartment.  He states that he checked out several places and decide on the place and will moving soon.  Patient is here because his pain is uncontrolled with his Tylenol 3.  Denies any numbness or weakness down the leg.  He states that he just has pain whenever he bears weight on it.  Patient denies any additional fall or trauma.  The history is provided by the patient.       Past Medical History:  Diagnosis Date  . Hypertension     Patient Active Problem List   Diagnosis Date Noted  . Vitamin D deficiency 01/19/2020  . Tobacco use disorder 01/18/2020  . Alcohol use 01/18/2020  . Post-traumatic osteoarthritis of right hip 01/18/2020  . Essential hypertension 01/18/2020  . Encounter for screening for HIV 01/18/2020  . Moderate episode of recurrent major depressive disorder ([place]) 01/18/2020  . Alcohol abuse with alcohol-induced mood disorder ([place]) 12/17/2019    Past Surgical History:  Procedure Laterality Date  . APPENDECTOMY    . TONSILLECTOMY         Family History  Problem Relation Age of Onset  . Heart disease Mother     Social History   Tobacco Use  . Smoking status: Current Every Day Smoker    Packs/day: 0.50  . Smokeless tobacco: Never Used  Substance Use Topics  . Alcohol use: Yes    Comment: amount varies  . Drug use: Not Currently   Types: Cocaine    Comment: hx of cocaine use, states that he has been clean five years    Home Medications Prior to Admission medications   Medication Sig Start Date End Date Taking? Authorizing Provider  acetaminophen-codeine (TYLENOL #3) 300-30 MG tablet Take 1-2 tablets by mouth 3 (three) times daily as needed for moderate pain. 01/24/20   Aundra Dubin, PA-C  ibuprofen (ADVIL) 600 MG tablet Take 1 tablet (600 mg total) by mouth every 8 (eight) hours as needed. 01/24/20   Aundra Dubin, PA-C  losartan-hydrochlorothiazide (HYZAAR) 100-25 MG tablet Take 1 tablet by mouth daily. 01/18/20   Elsie Stain, MD  meloxicam (MOBIC) 15 MG tablet Take 1 tablet (15 mg total) by mouth daily. 01/18/20   Elsie Stain, MD  oxyCODONE-acetaminophen (PERCOCET/ROXICET) 5-325 MG tablet Take 1 tablet by mouth every 8 (eight) hours as needed for severe pain. 01/11/20   Elsie Stain, MD  sertraline (ZOLOFT) 50 MG tablet Take 1 tablet (50 mg total) by mouth daily. 01/18/20   Elsie Stain, MD  Vitamin D, Ergocalciferol, (DRISDOL) 1.25 MG (50000 UNIT) CAPS capsule Take 1 capsule (50,000 Units total) by mouth every 7 (seven) days. 01/19/20   Elsie Stain, MD    Allergies    Tramadol  Review of Systems   Review of Systems  Musculoskeletal:  R hip pain   All other systems reviewed and are negative.   Physical Exam Updated Vital Signs BP (!) 153/93 (BP Location: Right Arm)   Pulse 85   Temp 98 F (36.7 C) (Oral)   Resp 15   SpO2 99%   Physical Exam Vitals and nursing note reviewed.  HENT:     Head: Normocephalic.     Nose: Nose normal.     Mouth/Throat:     Mouth: Mucous membranes are moist.  Eyes:     Extraocular Movements: Extraocular movements intact.     Pupils: Pupils are equal, round, and reactive to light.  Cardiovascular:     Rate and Rhythm: Normal rate.     Pulses: Normal pulses.  Pulmonary:     Effort: Pulmonary effort is normal.  Abdominal:     General:  Abdomen is flat.     Palpations: Abdomen is soft.  Musculoskeletal:     Cervical back: Normal range of motion.     Comments: R hip dec ROM but able to range it. No obvious deformity. No saddle anesthesia, + straight leg raise R side.   Skin:    General: Skin is warm.     Capillary Refill: Capillary refill takes less than 2 seconds.  Neurological:     General: No focal deficit present.     Mental Status: He is alert.  Psychiatric:        Mood and Affect: Mood normal.        Behavior: Behavior normal.     ED Results / Procedures / Treatments   Labs (all labs ordered are listed, but only abnormal results are displayed) Labs Reviewed - No data to display  EKG None  Radiology No results found.  Procedures Procedures (including critical care time)  Medications Ordered in ED Medications  HYDROmorphone (DILAUDID) injection 1 mg (has no administration in time range)    ED Course  I have reviewed the triage vital signs and the nursing notes.  Pertinent labs & imaging results that were available during my care of the patient were reviewed by me and considered in my medical decision making (see chart for details).    MDM Rules/Calculators/A&P                      Chad Bautista is a 44 y.o. male who presented with right hip pain. Is a chronic problem has been seeing orthopedic doctor and recommend hip replacement.  Patient has no additional trauma or injury. He already has crutches.  He is also getting set up for an apartment and should be able to schedule surgery when he does have a place to live.  Neurovascularly intact currently.  He is on Tylenol 3 already.  We will give him a shot of pain meds and if he runs out early, will need refill by ortho.   Final Clinical Impression(s) / ED Diagnoses Final diagnoses:  None    Rx / DC Orders ED Discharge Orders    None       Charlynne Pander, MD 02/01/20 [phone]

## 2020-02-01 NOTE — ED Triage Notes (Signed)
Pt BIBA from [facility].   Per EMS-  Pt reports hx of fall "years ago" reports worsening pain over last few days, denies new trauma. Pt arrived with EMS with crutches.   AOx4, ambulatory with crutches. Hx of hypertension

## 2020-02-01 NOTE — Discharge Instructions (Signed)
Take your tylenol #3 as prescribed by your doctor   Once you have an apartment, call orthopedic doctor to schedule for hip replacement.  If you run out of pain medicine early please call your orthopedic doctor.  Use crutches   Return to ER if you have worse pain, unable to walk, numbness, weakness

## 2020-02-01 NOTE — ED Notes (Signed)
Discharge paperwork reviewed with pt. Pt requesting note stating that he is to maintain bedrest.  EDP Silverio Lay made aware, gave verbal order  to provide requested note for 2 days. Note provided to pt. Pt also with requests for [facility] pass [facility]. LCSW notified of need for bus pass.

## 2020-02-01 NOTE — Telephone Encounter (Signed)
Patient called. Says the tylenol is not working for him. He is in pain. Would like someone to call him. His call back number is [phone]

## 2020-02-02 ENCOUNTER — Encounter: Payer: Self-pay | Admitting: Critical Care Medicine

## 2020-02-02 ENCOUNTER — Other Ambulatory Visit: Payer: Self-pay | Admitting: Critical Care Medicine

## 2020-02-02 MED ORDER — OXYCODONE-ACETAMINOPHEN 5-325 MG PO TABS: 1.0000 | ORAL_TABLET | Freq: Three times a day (TID) | ORAL | 0 refills | Status: DC | PRN

## 2020-02-02 MED ORDER — IBUPROFEN 600 MG PO TABS: 600.0000 mg | ORAL_TABLET | Freq: Four times a day (QID) | ORAL | 0 refills | Status: DC | PRN

## 2020-02-02 MED FILL — IBUPROFEN 600 MG TABLET: 600 | 15 days supply | Qty: 60 | Fill #0

## 2020-02-02 MED FILL — OXYCODONE-ACETAMINOPHEN 5-3: 5-325 | 8 days supply | Qty: 25 | Fill #0

## 2020-02-02 NOTE — Progress Notes (Signed)
Refills

## 2020-02-03 ENCOUNTER — Other Ambulatory Visit: Payer: Self-pay | Admitting: Physician Assistant

## 2020-02-03 NOTE — Telephone Encounter (Signed)
Would like Tramadol called into [facility] : [address], [place], [place] [address]

## 2020-02-03 NOTE — Telephone Encounter (Signed)
Patient aware of the below message  

## 2020-02-03 NOTE — Telephone Encounter (Signed)
Looks like he got 25 oxycodone from ED yesterday.  Cannot do tramadol since he got that

## 2020-02-03 NOTE — Progress Notes (Signed)
Patient ID: Chad Bautista, male   DOB: [birth year], 44 y.o.   MRN: [id number]  This is a 44 year old male who I have followed previously in the [facility] clinic as well as my clinic at [facility].  He has avascular necrosis of the right femoral head with severe arthritis of the right hip.  He is undergoing evaluation by orthopedics for potential hip replacement.  He is under significant pain and did go to the emergency room a week ago where he did receive 1 injection of Dilaudid.  The orthopedist did give the patient a prescription for Tylenol 3 but he states he is having side effects from that and is also not providing adequate pain relief.  He does state high-dose ibuprofen does help  The patient is using crutches at this time.  The plan is for the patient to get into housing and he hopes to secure housing within the next week he will then have his children come to stay with him after his surgery and he hopes to have the surgery scheduled sometime in March for the hip replacement  In the interim I will prescribe Percocet 5/325 1 every 6 hours as needed I gave him 25 only I checked the [facility] drug database and there are no other outstanding prescriptions other than the recent Tylenol 3 also give him 600 mg of ibuprofen to take every 6 hours as needed for pain

## 2020-02-06 ENCOUNTER — Encounter: Payer: Self-pay | Admitting: Critical Care Medicine

## 2020-02-06 ENCOUNTER — Telehealth: Payer: Self-pay | Admitting: Critical Care Medicine

## 2020-02-06 NOTE — Telephone Encounter (Signed)
The patient called me this morning and indicates he would like a letter stating that he can stay in his bed for a couple of hours in the afternoon due to severe hip pain  I will prepare this letter and give it to his case manager

## 2020-02-09 ENCOUNTER — Other Ambulatory Visit: Payer: Self-pay | Admitting: Critical Care Medicine

## 2020-02-09 ENCOUNTER — Encounter: Payer: Self-pay | Admitting: Critical Care Medicine

## 2020-02-09 DIAGNOSIS — G894 Chronic pain syndrome: Secondary | ICD-10-CM

## 2020-02-09 MED ORDER — OXYCODONE-ACETAMINOPHEN 5-325 MG PO TABS: 1.0000 | ORAL_TABLET | Freq: Three times a day (TID) | ORAL | 0 refills | Status: DC | PRN

## 2020-02-09 MED FILL — OXYCODONE-ACETAMINOPHEN 5-3: 5-325 | 10 days supply | Qty: 30 | Fill #0

## 2020-02-09 NOTE — Congregational Nurse Program (Signed)
Chad Bautista was seen at [facility] clinic for medication refill, but will need to sign a pain management contract prior to obtaining a refill for a controlled substance and take a drug screen.  Transportation will be provided tomorrow to [facility] for the above procedures.

## 2020-02-09 NOTE — Progress Notes (Signed)
Refills

## 2020-02-10 ENCOUNTER — Ambulatory Visit: Payer: Self-pay | Attending: Critical Care Medicine

## 2020-02-10 ENCOUNTER — Other Ambulatory Visit: Payer: Self-pay

## 2020-02-10 DIAGNOSIS — G894 Chronic pain syndrome: Secondary | ICD-10-CM

## 2020-02-10 NOTE — Progress Notes (Signed)
Patient ID: Chad Bautista, male   DOB: 1976-11-12, 44 y.o.   MRN: [id number]  This is a 44 year old male has history of avascular necrosis right femoral head and severe pain.  He is needing refills on his pain medications.  I told the patient if we are any managing his chronic pain till he gets his hip replacement he will need a pain contract and urine drug screen.  He agrees to come into the clinic today on 26 February to have the drug screen and sign the pain contract  We will represcribed Percocet give him a count of 30 to take every 8 hours as needed we will continue ibuprofen to alternate and he has been given a letter so he can rest in his bed for periods of time in the afternoon  He has an upcoming follow-up with orthopedics to schedule his surgery in March

## 2020-02-17 ENCOUNTER — Encounter: Payer: Self-pay | Admitting: Critical Care Medicine

## 2020-02-17 ENCOUNTER — Other Ambulatory Visit: Payer: Self-pay | Admitting: Critical Care Medicine

## 2020-02-17 MED ORDER — LOSARTAN POTASSIUM-HCTZ 100-25 MG PO TABS: 1.0000 | ORAL_TABLET | Freq: Every day | ORAL | 1 refills | Status: DC

## 2020-02-17 MED ORDER — OXYCODONE-ACETAMINOPHEN 5-325 MG PO TABS: 1.0000 | ORAL_TABLET | Freq: Three times a day (TID) | ORAL | 0 refills | Status: DC | PRN

## 2020-02-17 MED FILL — LOSARTAN-HCTZ 100-25 MG TAB: 100-25 | 30 days supply | Qty: 30 | Fill #0

## 2020-02-17 MED FILL — OXYCODONE-ACETAMINOPHEN 5-3: 5-325 | 10 days supply | Qty: 30 | Fill #0

## 2020-02-17 NOTE — Progress Notes (Signed)
Med refill given

## 2020-02-17 NOTE — Progress Notes (Signed)
Patient ID: Chad Bautista, male   DOB: January 20, 1976, 44 y.o.   MRN: [id number]  This is a pleasant 44 year old male who I follow in the [facility] in the community health and wellness clinic.  This patient needs to undergo right hip replacement and is awaiting getting his housing.  He has secured an apartment which he hopes to move into shortly.  He also needs refills on his pain medication program and losartan HCT    Blood pressure 150/76 saturation 98% room air pulse 83  Plan will be to refill the Percocet and I reviewed the [place] drug database and I am the only prescriber at this time.  Also he will continue Advil but discontinue meloxicam and we will plan to refill losartan HCT at this visit

## 2020-02-17 NOTE — Congregational Nurse Program (Signed)
Mr. Witty requested medication refill, CN will pick-up and deliver

## 2020-02-18 LAB — OXYCODONE/OXYMORPHONE, CONFIRM
OXYCODONE/OXYMORPH: POSITIVE — AB
OXYCODONE: >3000 ng/mL
OXYCODONE: POSITIVE — AB
OXYMORPHONE: NEGATIVE

## 2020-02-18 LAB — DRUG SCREEN 764883 11+OXYCO+ALC+CRT-BUND
Amphetamines, Urine: NEGATIVE ng/mL
BENZODIAZ UR QL: NEGATIVE ng/mL
Barbiturate: NEGATIVE ng/mL
Cannabinoid Quant, Ur: NEGATIVE ng/mL
Cocaine (Metabolite): NEGATIVE ng/mL
Creatinine: 214.3 mg/dL (ref 20.0–300.0)
Ethanol: NEGATIVE %
Meperidine: NEGATIVE ng/mL
Methadone Screen, Urine: NEGATIVE ng/mL
Phencyclidine: NEGATIVE ng/mL
Propoxyphene: NEGATIVE ng/mL
Tramadol: NEGATIVE ng/mL
pH, Urine: 5.2 (ref 4.5–8.9)

## 2020-02-18 LAB — OPIATES CONFIRMATION, URINE: Opiates: NEGATIVE ng/mL

## 2020-02-22 ENCOUNTER — Other Ambulatory Visit: Payer: Self-pay

## 2020-02-22 ENCOUNTER — Encounter (HOSPITAL_COMMUNITY): Payer: Self-pay | Admitting: Emergency Medicine

## 2020-02-22 ENCOUNTER — Emergency Department (HOSPITAL_COMMUNITY): Payer: Self-pay

## 2020-02-22 ENCOUNTER — Emergency Department (HOSPITAL_COMMUNITY)
Admission: EM | Admit: 2020-02-22 | Discharge: 2020-02-22 | Disposition: A | Discharge status: Home / Self Care | Payer: Self-pay | Attending: Emergency Medicine | Admitting: Emergency Medicine

## 2020-02-22 DIAGNOSIS — F17210 Nicotine dependence, cigarettes, uncomplicated: Secondary | ICD-10-CM | POA: Insufficient documentation

## 2020-02-22 DIAGNOSIS — M25551 Pain in right hip: Secondary | ICD-10-CM | POA: Insufficient documentation

## 2020-02-22 DIAGNOSIS — I10 Essential (primary) hypertension: Secondary | ICD-10-CM | POA: Insufficient documentation

## 2020-02-22 DIAGNOSIS — G8929 Other chronic pain: Secondary | ICD-10-CM | POA: Insufficient documentation

## 2020-02-22 DIAGNOSIS — Z79899 Other long term (current) drug therapy: Secondary | ICD-10-CM | POA: Insufficient documentation

## 2020-02-22 DIAGNOSIS — M87051 Idiopathic aseptic necrosis of right femur: Secondary | ICD-10-CM | POA: Insufficient documentation

## 2020-02-22 MED ORDER — NAPROXEN 250 MG PO TABS
500.0000 mg | ORAL_TABLET | Freq: Once | ORAL | Status: AC
  Administered 2020-02-22: 500 mg via ORAL
  Filled 2020-02-22: qty 2

## 2020-02-22 MED ORDER — NAPROXEN 500 MG PO TABS: 500.0000 mg | ORAL_TABLET | Freq: Two times a day (BID) | ORAL | 0 refills | Status: DC

## 2020-02-22 NOTE — ED Triage Notes (Signed)
C/o R hip pain that is worse since hip "popping" while walking to bathroom at 2am.  Hx of hip pain and recommended hip replacement per orthopedist.  Pt ambulatory with crutches.

## 2020-02-22 NOTE — ED Notes (Signed)
Pt d/c home per MD order. Discharge summary reviewed, pt verbalizes understanding. Pt signed e-signature. No s/s of acute distress noted.

## 2020-02-22 NOTE — ED Provider Notes (Signed)
[place] EMERGENCY DEPARTMENT Provider Note   CSN: [id number] Arrival date & time: 02/22/20  0745     History Chief Complaint  Patient presents with  . Hip Pain    Chad Bautista is a 44 y.o. male.  HPI   This patient is a 44 year old male, history of hypertension as well as a history of avascular necrosis of his hip, he has chronic hip pain and states this morning at around 2:00 in the morning he felt an acute pop while he was trying to walk.  He states that he has been seen by orthopedics, he needs a hip replacement, he is getting an apartment within the next several days and has family in [place] who he states can help.  He wants to have his operation sooner because of the pain that he is having.  He is having to rotate when he walks which causes a blister on the bottom of his left foot which popped this morning.  Denies fevers chills or other symptoms.  Past Medical History:  Diagnosis Date  . Hypertension     Patient Active Problem List   Diagnosis Date Noted  . Vitamin D deficiency 01/19/2020  . Tobacco use disorder 01/18/2020  . Alcohol use 01/18/2020  . Post-traumatic osteoarthritis of right hip 01/18/2020  . Essential hypertension 01/18/2020  . Encounter for screening for HIV 01/18/2020  . Moderate episode of recurrent major depressive disorder ([place]) 01/18/2020  . Alcohol abuse with alcohol-induced mood disorder ([place]) 12/17/2019    Past Surgical History:  Procedure Laterality Date  . APPENDECTOMY    . TONSILLECTOMY         Family History  Problem Relation Age of Onset  . Heart disease Mother     Social History   Tobacco Use  . Smoking status: Current Every Day Smoker    Packs/day: 0.50  . Smokeless tobacco: Never Used  Substance Use Topics  . Alcohol use: Yes    Comment: amount varies  . Drug use: Not Currently    Types: Cocaine    Comment: hx of cocaine use, states that he has been clean five years    Home  Medications Prior to Admission medications   Medication Sig Start Date End Date Taking? Authorizing Provider  losartan-hydrochlorothiazide (HYZAAR) 100-25 MG tablet Take 1 tablet by mouth daily. 02/17/20   Elsie Stain, MD  naproxen (NAPROSYN) 500 MG tablet Take 1 tablet (500 mg total) by mouth 2 (two) times daily with a meal. 02/22/20   Noemi Chapel, MD  oxyCODONE-acetaminophen (PERCOCET/ROXICET) 5-325 MG tablet Take 1 tablet by mouth every 8 (eight) hours as needed for severe pain. 02/17/20   Elsie Stain, MD  sertraline (ZOLOFT) 50 MG tablet Take 1 tablet (50 mg total) by mouth daily. 01/18/20   Elsie Stain, MD  Vitamin D, Ergocalciferol, (DRISDOL) 1.25 MG (50000 UNIT) CAPS capsule Take 1 capsule (50,000 Units total) by mouth every 7 (seven) days. 01/19/20   Elsie Stain, MD    Allergies    Tramadol  Review of Systems   Review of Systems  All other systems reviewed and are negative.   Physical Exam Updated Vital Signs BP (!) 137/113   Pulse 93   Temp 97.6 F (36.4 C) (Oral)   Resp 18   Ht 1.905 m (6\' 3" )   Wt 113.4 kg   SpO2 100%   BMI 31.25 kg/m   Physical Exam Vitals and nursing note reviewed.  Constitutional:  General: He is not in acute distress.    Appearance: He is well-developed.  HENT:     Head: Normocephalic and atraumatic.     Mouth/Throat:     Pharynx: No oropharyngeal exudate.  Eyes:     General: No scleral icterus.       Right eye: No discharge.        Left eye: No discharge.     Conjunctiva/sclera: Conjunctivae normal.     Pupils: Pupils are equal, round, and reactive to light.  Neck:     Thyroid: No thyromegaly.     Vascular: No JVD.  Cardiovascular:     Rate and Rhythm: Normal rate and regular rhythm.     Heart sounds: Normal heart sounds. No murmur. No friction rub. No gallop.   Pulmonary:     Effort: Pulmonary effort is normal. No respiratory distress.     Breath sounds: Normal breath sounds. No wheezing or rales.    Abdominal:     General: Bowel sounds are normal. There is no distension.     Palpations: Abdomen is soft. There is no mass.     Tenderness: There is no abdominal tenderness.  Musculoskeletal:        General: No tenderness. Normal range of motion.     Cervical back: Normal range of motion and neck supple.     Comments: Tenderness with range of motion of the right hip but no leg length discrepancies  Lymphadenopathy:     Cervical: No cervical adenopathy.  Skin:    General: Skin is warm and dry.     Findings: No erythema or rash.     Comments: Open ruptured blister on the sole of the left foot, clean, no surrounding erythema drainage or foul smell  Neurological:     Mental Status: He is alert.     Coordination: Coordination normal.     Comments: No cranial nerve deficits 3 through 12, no facial droop, clear speech, normal coordination  Psychiatric:        Behavior: Behavior normal.     ED Results / Procedures / Treatments   Labs (all labs ordered are listed, but only abnormal results are displayed) Labs Reviewed - No data to display  EKG None  Radiology DG Hip Unilat W or Wo Pelvis 2-3 Views Right  Result Date: 02/22/2020 CLINICAL DATA:  Right hip pain EXAM: DG HIP (WITH OR WITHOUT PELVIS) 2-3V RIGHT COMPARISON:  12/16/2019 FINDINGS: There is flattening of the right humeral head with advanced osteoarthritic changes. Findings likely related to avascular necrosis. Left hip joint spaces maintained. SI joints symmetric and unremarkable. No acute bony abnormality. Specifically, no fracture, subluxation, or dislocation. IMPRESSION: Stable abnormal appearance of the right hip with probable changes of avascular necrosis and secondary advanced osteoarthritis. No acute bony abnormality. Electronically Signed   By: Charlett Nose M.D.   On: 02/22/2020 09:13    Procedures Procedures (including critical care time)  Medications Ordered in ED Medications  naproxen (NAPROSYN) tablet 500 mg  (has no administration in time range)    ED Course  I have reviewed the triage vital signs and the nursing notes.  Pertinent labs & imaging results that were available during my care of the patient were reviewed by me and considered in my medical decision making (see chart for details).    MDM Rules/Calculators/A&P                      This patient has chronic avascular  necrosis of the hip, there is no high risk features here, needs close follow-up, he has arranged a place for himself to live so that he can pursue getting his surgery as he was told that he cannot have surgery when he is in the homeless shelter.  The patient is agreeable to pursue this route though he is disappointed that he cannot have surgery today.  Will obtain an x-ray and if there is an acute fracture will call orthopedics  This patient has been given some anti-inflammatories, his x-ray appears unchanged.  I have personally viewed the x-ray of the hip and find there to be consistent findings with prior x-rays and avascular necrosis without any obvious new acute fractures.  Patient updated on course, upset that he cannot have acute surgery however understands that he needs to follow-up closely.  He will be given an anti-inflammatory prescription  I reviewed the drug database, the patient was given oxycodone approximately 5 days ago  Final Clinical Impression(s) / ED Diagnoses Final diagnoses:  Chronic hip pain, right  Avascular necrosis of bone of hip, right (HCC)    Rx / DC Orders ED Discharge Orders         Ordered    naproxen (NAPROSYN) 500 MG tablet  2 times daily with meals     02/22/20 0919           Eber Hong, MD 02/22/20 [phone]

## 2020-02-22 NOTE — Discharge Instructions (Signed)
You may continue to follow-up in the outpatient setting with your orthopedist regarding your hip problem.  There does not appear to be any signs of hip fracture today, the hip is unchanged in appearance and shows the known degenerative changes of avascular necrosis.  You may take naproxen twice daily as needed for pain, do not take it any more often than that as it can cause kidney problems or stomach ulcers if taken in large quantities or for extended periods of time.

## 2020-02-22 NOTE — ED Notes (Signed)
Pt transported to xray 

## 2020-02-23 ENCOUNTER — Encounter: Payer: Self-pay | Admitting: Critical Care Medicine

## 2020-02-23 ENCOUNTER — Other Ambulatory Visit: Payer: Self-pay | Admitting: Critical Care Medicine

## 2020-02-23 MED ORDER — IBUPROFEN 600 MG PO TABS: 600.0000 mg | ORAL_TABLET | Freq: Three times a day (TID) | ORAL | 0 refills | Status: DC | PRN

## 2020-02-23 MED ORDER — OXYCODONE-ACETAMINOPHEN 5-325 MG PO TABS: 1.0000 | ORAL_TABLET | Freq: Three times a day (TID) | ORAL | 0 refills | Status: DC | PRN

## 2020-02-23 MED FILL — IBUPROFEN 600 MG TABLET: 600 | 20 days supply | Qty: 60 | Fill #0

## 2020-02-23 NOTE — Progress Notes (Unsigned)
Refills

## 2020-02-23 NOTE — Congregational Nurse Program (Signed)
Mr. Cotham was seen at [facility] for f/u after ED visit for Rt hip pain. Blister treated and dressed to bottom of Rt foot. CN will pick-up and deliver medications tomorrow from [facility]

## 2020-02-24 NOTE — Progress Notes (Signed)
Patient ID: Chad Bautista, male   DOB: Nov 07, 1976, 44 y.o.   MRN: [id number]  Next is 44 year old male seen in return follow-up the [facility].  He has avascular process of the right femoral head and while getting to the bathroom in the middle the night a few days ago he heard a popping sound in his right hip with increased pain.  He went to the emergency room x-rays did not show acute changes or fracture.  He comes in today for follow-up.  He has rubbed a blister on his left foot because he uses his left foot to pivot a great deal and wears a thin pair of socks and tennis shoes.  On exam there has been a blistering area of the left foot we dressed this with triple antibiotic ointment and sterile gauze and sticking tape  We gave the patient supplies to replace this dressing over the next 3 days  We did send in refill on oxycodone and note it is not available to be filled until 15 March we also sent in a prescription refill for ibuprofen 600 mg every 8 8 hours as needed.  Note he was given a Naprosyn prescription in the emergency room but he did not fill this

## 2020-02-27 MED FILL — OXYCODONE-ACETAMINOPHEN 5-3: 5-325 | 10 days supply | Qty: 30 | Fill #0

## 2020-03-03 ENCOUNTER — Emergency Department (HOSPITAL_COMMUNITY)
Admission: EM | Admit: 2020-03-03 | Discharge: 2020-03-03 | Disposition: A | Discharge status: Home / Self Care | Payer: Medicaid Other | Attending: Emergency Medicine | Admitting: Emergency Medicine

## 2020-03-03 ENCOUNTER — Encounter (HOSPITAL_COMMUNITY): Payer: Self-pay | Admitting: Emergency Medicine

## 2020-03-03 DIAGNOSIS — M25551 Pain in right hip: Secondary | ICD-10-CM

## 2020-03-03 DIAGNOSIS — F17210 Nicotine dependence, cigarettes, uncomplicated: Secondary | ICD-10-CM | POA: Insufficient documentation

## 2020-03-03 DIAGNOSIS — Z79899 Other long term (current) drug therapy: Secondary | ICD-10-CM | POA: Insufficient documentation

## 2020-03-03 DIAGNOSIS — I10 Essential (primary) hypertension: Secondary | ICD-10-CM | POA: Insufficient documentation

## 2020-03-03 MED ORDER — KETOROLAC TROMETHAMINE 15 MG/ML IJ SOLN
15.0000 mg | Freq: Once | INTRAMUSCULAR | Status: AC
  Administered 2020-03-03: 15 mg via INTRAMUSCULAR
  Filled 2020-03-03: qty 1

## 2020-03-03 MED ORDER — LIDOCAINE 5 % EX PTCH: 1.0000 | MEDICATED_PATCH | CUTANEOUS | Status: DC

## 2020-03-03 MED ORDER — PREDNISONE 10 MG (21) PO TBPK: ORAL_TABLET | Freq: Every day | ORAL | 0 refills | Status: DC

## 2020-03-03 MED ORDER — METHYLPREDNISOLONE SODIUM SUCC 40 MG IJ SOLR
40.0000 mg | Freq: Once | INTRAMUSCULAR | Status: AC
  Administered 2020-03-03: 40 mg via INTRAMUSCULAR
  Filled 2020-03-03: qty 1

## 2020-03-03 MED ORDER — BACITRACIN ZINC 500 UNIT/GM EX OINT
TOPICAL_OINTMENT | Freq: Once | CUTANEOUS | Status: AC
  Administered 2020-03-03: 1 via TOPICAL
  Filled 2020-03-03: qty 0.9

## 2020-03-03 NOTE — ED Triage Notes (Signed)
Pt. Stated, Chad Bautista had so much hip pain on the right side. Im suppose to have a rt. Hip replacement but waiting for this and that. Ive not been able to eat , drink or nothing it hurts so bad.

## 2020-03-03 NOTE — Discharge Instructions (Addendum)
You have been seen today for right hip pain. Please read and follow all provided instructions. Return to the emergency room for worsening condition or new concerning symptoms.    1. Medications:  Prescription written for prednisone.  This is a steroid which helps with inflammation and pain.  Please take as prescribed.  Continue usual home medications Take medications as prescribed. Please review all of the medicines and only take them if you do not have an allergy to them.   2. Treatment: rest, drink plenty of fluids  3. Follow Up:  Please follow up with primary care provider by scheduling an appointment as soon as possible for a visit    It is also a possibility that you have an allergic reaction to any of the medicines that you have been prescribed - Everybody reacts differently to medications and while MOST people have no trouble with most medicines, you may have a reaction such as nausea, vomiting, rash, swelling, shortness of breath. If this is the case, please stop taking the medicine immediately and contact your physician.  ?

## 2020-03-03 NOTE — ED Provider Notes (Signed)
[facility] EMERGENCY DEPARTMENT Provider Note   CSN: [id number] Arrival date & time: 03/03/20  1347     History Chief Complaint  Patient presents with  . Hip Pain    Chad Bautista is a 44 y.o. male past medical history significant for hypertension, chronic avascular necrosis of right hip presenting to emergency department today with chief complaint of progressively worsening right hip pain x5 days.  Patient states he recently moved into an apartment and he had to carry several boxes.  He states after moving in he noticed his right hip was hurting more frequently than it usually does. He is prescribed oxycodone by pcp for chronic pain. He states he has been taking that without any symptom relief. He has also been taking an anti-inflammatory however has not taken it today or yesterday. Pain is 10/10 in severity. He denies any fever, chills, swelling, fall, trauma or new injury to the hip.   History provided by patient with additional history obtained from chart review.    Past Medical History:  Diagnosis Date  . Hypertension     Patient Active Problem List   Diagnosis Date Noted  . Vitamin D deficiency 01/19/2020  . Tobacco use disorder 01/18/2020  . Alcohol use 01/18/2020  . Post-traumatic osteoarthritis of right hip 01/18/2020  . Essential hypertension 01/18/2020  . Encounter for screening for HIV 01/18/2020  . Moderate episode of recurrent major depressive disorder (HCC) 01/18/2020  . Alcohol abuse with alcohol-induced mood disorder (HCC) 12/17/2019    Past Surgical History:  Procedure Laterality Date  . APPENDECTOMY    . TONSILLECTOMY         Family History  Problem Relation Age of Onset  . Heart disease Mother     Social History   Tobacco Use  . Smoking status: Current Every Day Smoker    Packs/day: 0.50  . Smokeless tobacco: Never Used  Substance Use Topics  . Alcohol use: Yes    Comment: amount varies  . Drug use: Not Currently   Types: Cocaine    Comment: hx of cocaine use, states that he has been clean five years    Home Medications Prior to Admission medications   Medication Sig Start Date End Date Taking? Authorizing Provider  ibuprofen (ADVIL) 600 MG tablet Take 1 tablet (600 mg total) by mouth every 8 (eight) hours as needed. 02/23/20   Storm Frisk, MD  losartan-hydrochlorothiazide (HYZAAR) 100-25 MG tablet Take 1 tablet by mouth daily. 02/17/20   Storm Frisk, MD  oxyCODONE-acetaminophen (PERCOCET/ROXICET) 5-325 MG tablet Take 1 tablet by mouth every 8 (eight) hours as needed for severe pain. 02/23/20   Storm Frisk, MD  sertraline (ZOLOFT) 50 MG tablet Take 1 tablet (50 mg total) by mouth daily. 01/18/20   Storm Frisk, MD  Vitamin D, Ergocalciferol, (DRISDOL) 1.25 MG (50000 UNIT) CAPS capsule Take 1 capsule (50,000 Units total) by mouth every 7 (seven) days. 01/19/20   Storm Frisk, MD    Allergies    Tramadol  Review of Systems   Review of Systems  All other systems are reviewed and are negative for acute change except as noted in the HPI.   Physical Exam Updated Vital Signs BP 134/89 (BP Location: Right Arm)   Pulse 82   Resp 18   Ht 6\' 3"  (1.905 m)   Wt 113.4 kg   SpO2 100%   BMI 31.25 kg/m   Physical Exam Vitals and nursing note reviewed.  Constitutional:  General: He is not in acute distress.    Appearance: He is not ill-appearing.  HENT:     Head: Normocephalic and atraumatic.     Right Ear: Tympanic membrane and external ear normal.     Left Ear: Tympanic membrane and external ear normal.     Nose: Nose normal.     Mouth/Throat:     Mouth: Mucous membranes are moist.     Pharynx: Oropharynx is clear.  Eyes:     General: No scleral icterus.       Right eye: No discharge.        Left eye: No discharge.     Extraocular Movements: Extraocular movements intact.     Conjunctiva/sclera: Conjunctivae normal.     Pupils: Pupils are equal, round, and reactive to  light.  Neck:     Vascular: No JVD.  Cardiovascular:     Rate and Rhythm: Normal rate and regular rhythm.     Pulses: Normal pulses.          Dorsalis pedis pulses are 2+ on the right side and 2+ on the left side.     Heart sounds: Normal heart sounds.  Pulmonary:     Comments: Lungs clear to auscultation in all fields. Symmetric chest rise. No wheezing, rales, or rhonchi. Abdominal:     Comments: Abdomen is soft, non-distended, and non-tender in all quadrants. No rigidity, no guarding. No peritoneal signs.  Musculoskeletal:     Cervical back: Normal range of motion.     Right lower leg: No edema.     Left lower leg: No edema.     Comments: Tenderness with range of motion of the right hip. Legs are equal length, no leg length discrepancies  Neurovascularly intact distally. Compartments soft above and below affected joint.    Feet:     Comments: Plantar aspect of left foot with open wound. Appears to be blister that ruptured. The wound appears clean and is without surrounding erythema drainage or foul smell  Skin:    General: Skin is warm and dry.     Capillary Refill: Capillary refill takes less than 2 seconds.  Neurological:     Mental Status: He is oriented to person, place, and time.     GCS: GCS eye subscore is 4. GCS verbal subscore is 5. GCS motor subscore is 6.     Comments: Fluent speech, no facial droop.  Psychiatric:        Behavior: Behavior normal.     ED Results / Procedures / Treatments   Labs (all labs ordered are listed, but only abnormal results are displayed) Labs Reviewed - No data to display  EKG None  Radiology No results found.  Procedures Procedures (including critical care time)  Medications Ordered in ED Medications  lidocaine (LIDODERM) 5 % 1 patch (1 patch Transdermal Refused 03/03/20 1539)  bacitracin ointment (1 application Topical Given 03/03/20 1540)  methylPREDNISolone sodium succinate (SOLU-MEDROL) 40 mg/mL injection 40 mg (40 mg  Intramuscular Given 03/03/20 1539)    ED Course  I have reviewed the triage vital signs and the nursing notes.  Pertinent labs & imaging results that were available during my care of the patient were reviewed by me and considered in my medical decision making (see chart for details).  Vitals:   03/03/20 1351 03/03/20 1353 03/03/20 1629 03/03/20 1648  BP:  (!) 125/99 134/89   Pulse:  (!) 120 82   Resp:  (!) 22 18   Temp:  98.7 F (37.1 C)  TempSrc:    Oral  SpO2:  98% 100%   Weight: 113.4 kg     Height: 6\' 3"  (1.905 m)         MDM Rules/Calculators/A&P                      Patient seen and examined. Patient presents awake, alert, hemodynamically stable, afebrile, non toxic.  He was noted to be tachycardic to 120 in triage however during my exam heart rate ranges in the 80s.  On my exam there are no endings to suggest a new fracture or dislocation.  Legs are equal length.  Patient was seen in the ED x10 days ago and had x-rays at that time, do not feel that emergent imaging needs to be performed today given he had no new trauma.  I viewed those images and agree that there are no new obvious acute fractures.  He is neurovascularly intact distally. Compartments soft above and below affected joint.   Patient is requesting IM pain medicine.  From chart review patient regularly gets oxycodone from his PCP.  I do not feel it is appropriate to give additional narcotics. Patient given IM solumedrol and toradol. Will also give prescription for steroid taper to help with pain and inflammation over the coming days until he can follow up with pcp or orthopedics. I attempted to review PDMP however the page was unable to load, not sure why. Oxycodone is listed under his home medications that was written by pcp on 02/23/2020.  The patient appears reasonably screened and/or stabilized for discharge and I doubt any other medical condition or other [facility] requiring further screening, evaluation, or treatment  in the ED at this time prior to discharge. The patient is safe for discharge with strict return precautions discussed.    Portions of this note were generated with [facility]. Dictation errors may occur despite best attempts at proofreading.   Final Clinical Impression(s) / ED Diagnoses Final diagnoses:  None    Rx / DC Orders ED Discharge Orders    None       Scientist, clinical (histocompatibility and immunogenetics) 03/03/20 1704    03/05/20, MD 03/03/20 [phone]

## 2020-03-03 NOTE — ED Triage Notes (Signed)
Pt. Stated, This has been going on for 3 years.

## 2020-03-03 NOTE — Progress Notes (Signed)
  [facility] Medication Assistance Card  Name: Lamarr Feenstra  ID (MRN): [id number] Bin: [id number] RX Group: [id number]  Discharge Date:03/03/2020  5:15 PM  Expiration Date:03/11/2020  (must be filled within 7 days of discharge)     Dear Chad Bautista:  You have been approved to have the prescriptions written by your discharging physician filled through our [facility] (Medication Assistance Through [facility]) program. This program allows for a one-time (no refills) 34-day supply of selected medications for a low copay amount.  The copay is $3.00 per prescription. For instance, if you have one prescription, you will pay $3.00; for two prescriptions, you pay $6.00; for three prescriptions, you pay $9.00; and so on.  Only certain pharmacies are participating in this program with [facility]. You will need to select one of the pharmacies from the attached list and take your prescriptions, this letter, and your photo ID to one of the participating pharmacies.   We are excited that you are able to use the [facility] program to get your medications. These prescriptions must be filled within 7 days of hospital discharge or they will no longer be valid for the [facility] program. Should you have any problems with your prescriptions please contact your case management team member at [phone] for [facility].   Thank you,    [facility]

## 2020-03-05 ENCOUNTER — Other Ambulatory Visit: Payer: Self-pay | Admitting: Critical Care Medicine

## 2020-03-05 ENCOUNTER — Telehealth: Payer: Self-pay | Admitting: Critical Care Medicine

## 2020-03-05 ENCOUNTER — Telehealth: Payer: Self-pay | Admitting: *Deleted

## 2020-03-05 MED ORDER — OXYCODONE-ACETAMINOPHEN 10-325 MG PO TABS: 1.0000 | ORAL_TABLET | Freq: Four times a day (QID) | ORAL | 0 refills | Status: DC | PRN

## 2020-03-05 MED ORDER — OXYCODONE-ACETAMINOPHEN 10-325 MG PO TABS: 1.0000 | ORAL_TABLET | Freq: Three times a day (TID) | ORAL | 0 refills | Status: AC | PRN

## 2020-03-05 MED ORDER — PREDNISONE 10 MG (21) PO TBPK: ORAL_TABLET | Freq: Every day | ORAL | 0 refills | Status: DC

## 2020-03-05 MED ORDER — SENNOSIDES-DOCUSATE SODIUM 8.6-50 MG PO TABS: 2.0000 | ORAL_TABLET | Freq: Every day | ORAL | 0 refills | Status: DC

## 2020-03-05 NOTE — Telephone Encounter (Signed)
The patient contacted me and needs more pain medication.  He also cannot ambulate due to the sore on his forefoot on the Left.  I will supply him with a wheelchair for mobility temporarily  I also will Rx refill on pain meds, stool softener, and Rx the prednison he was unable to obtain from ED visit 3/20  [place] controlled substance database reviewed

## 2020-03-05 NOTE — Telephone Encounter (Signed)
TOC CM contacted pt to follow up on RX, states he did not pick up Prednisone. He contacted his PCP and is waiting on call back. States he is requesting a wheelchair, and pain medications. States the last time, Dawn, Congregational RN brought his medications to him and he did not have to pay anything out of pocket. States he is waiting call back from PCP. Isidoro Donning RN CCM, [facility] ED TOC CM [phone]

## 2020-03-05 NOTE — Progress Notes (Signed)
03/03/2020 5:15 pm TOC Referral received to assist with medications. Pt will be provided with [facility] letter to assist with medications. He can use once per year with a $3 copay. Isidoro Donning RN CCM, [facility] ED TOC CM [phone]

## 2020-03-06 ENCOUNTER — Telehealth: Payer: Self-pay

## 2020-03-06 ENCOUNTER — Telehealth: Payer: Self-pay | Admitting: Critical Care Medicine

## 2020-03-06 ENCOUNTER — Encounter: Payer: Self-pay | Admitting: Critical Care Medicine

## 2020-03-06 NOTE — Telephone Encounter (Signed)
Patient called saying that Elenora Gamma form the [employer] his helping him get furniture. Patient states that in order for him to get the furniture he needs a letter from his PCP saying that he needs to have hip replacement surgery. If the letter is able to be written he patient would like the letter emailed to [email or web address] Please f/u

## 2020-03-06 NOTE — Progress Notes (Signed)
Letter for [employer]

## 2020-03-06 NOTE — Telephone Encounter (Signed)
Call placed to patient and informed him that Chyrl Civatte, RN/[facility] has a wheelchair for him and he can pick it up at her office anytime.  He was very Adult nurse

## 2020-03-06 NOTE — Telephone Encounter (Signed)
Dictating letter now.  pls pull from epic and email for me to the email address at [employer]

## 2020-03-08 ENCOUNTER — Telehealth: Payer: Self-pay | Admitting: Orthopaedic Surgery

## 2020-03-08 NOTE — Telephone Encounter (Signed)
Patient has moved into an apartment now and was told to contact office once he moved so he can get hip replacement surgery scheduled.  Please call patient at [phone]

## 2020-03-09 NOTE — Telephone Encounter (Signed)
Please have him come in for appt to discuss surgery

## 2020-03-09 NOTE — Telephone Encounter (Signed)
Appt made

## 2020-03-14 ENCOUNTER — Other Ambulatory Visit: Payer: Self-pay

## 2020-03-14 ENCOUNTER — Emergency Department (HOSPITAL_COMMUNITY)
Admission: EM | Admit: 2020-03-14 | Discharge: 2020-03-15 | Disposition: A | Discharge status: Home / Self Care | Payer: Medicaid Other | Attending: Emergency Medicine | Admitting: Emergency Medicine

## 2020-03-14 ENCOUNTER — Encounter (HOSPITAL_COMMUNITY): Payer: Self-pay | Admitting: Emergency Medicine

## 2020-03-14 DIAGNOSIS — Z5321 Procedure and treatment not carried out due to patient leaving prior to being seen by health care provider: Secondary | ICD-10-CM | POA: Insufficient documentation

## 2020-03-14 DIAGNOSIS — F419 Anxiety disorder, unspecified: Secondary | ICD-10-CM | POA: Insufficient documentation
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
## 2020-03-15 ENCOUNTER — Emergency Department (HOSPITAL_COMMUNITY)
Admission: EM | Admit: 2020-03-15 | Discharge: 2020-03-15 | Disposition: A | Discharge status: Home / Self Care | Payer: Medicaid Other | Attending: Emergency Medicine | Admitting: Emergency Medicine

## 2020-03-15 DIAGNOSIS — R42 Dizziness and giddiness: Secondary | ICD-10-CM | POA: Insufficient documentation

## 2020-03-15 DIAGNOSIS — R631 Polydipsia: Secondary | ICD-10-CM | POA: Insufficient documentation

## 2020-03-15 DIAGNOSIS — F17210 Nicotine dependence, cigarettes, uncomplicated: Secondary | ICD-10-CM | POA: Insufficient documentation

## 2020-03-15 DIAGNOSIS — R5383 Other fatigue: Secondary | ICD-10-CM

## 2020-03-15 DIAGNOSIS — Z79899 Other long term (current) drug therapy: Secondary | ICD-10-CM | POA: Insufficient documentation

## 2020-03-15 MED ORDER — LORAZEPAM 2 MG/ML IJ SOLN
1.0000 mg | Freq: Once | INTRAMUSCULAR | Status: AC
  Administered 2020-03-15: 1 mg via INTRAVENOUS
  Filled 2020-03-15: qty 1

## 2020-03-15 NOTE — ED Notes (Signed)
[facility] called for room. Sort Chad Bautista states patient left out waiting room and never walked back in approx 20 minutes ago.
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
## 2020-03-15 NOTE — ED Notes (Signed)
Spoke to Pt. He stated he was feeling fine. Vitals WDL
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
## 2020-03-20 ENCOUNTER — Ambulatory Visit: Payer: Self-pay | Admitting: Critical Care Medicine

## 2020-03-20 ENCOUNTER — Other Ambulatory Visit: Payer: Self-pay

## 2020-03-20 ENCOUNTER — Ambulatory Visit (INDEPENDENT_AMBULATORY_CARE_PROVIDER_SITE_OTHER): Payer: Self-pay | Admitting: Orthopaedic Surgery

## 2020-03-20 ENCOUNTER — Encounter: Payer: Self-pay | Admitting: Orthopaedic Surgery

## 2020-03-20 DIAGNOSIS — M87051 Idiopathic aseptic necrosis of right femur: Secondary | ICD-10-CM
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
## 2020-03-20 NOTE — Progress Notes (Signed)
Office Visit Note   Patient: Chad Bautista           Date of Birth: 1976-06-30           MRN: [id number] Visit Date: 03/20/2020              Requested by: Elsie Stain, MD [address],  [place] [address] PCP: Elsie Stain, MD   Assessment & Plan: Visit Diagnoses:  1. Avascular necrosis of femur head, right ([place])     Plan: Impression is right hip avascular porosis with femoral head collapse and secondary degenerative changes of the hip joint.  We reviewed the x-rays and clinical findings again in detail today.  Now that Chad Bautista has secured stable housing I think it is appropriate to move forward with a hip replacement.  Risks benefits rehab recovery were all reviewed in detail today.  Questions encouraged and answered.  Follow-up for 2-week postop visit.  Follow-Up Instructions: Return for 2 week postop visit.   Orders:  No orders of the defined types were placed in this encounter.  No orders of the defined types were placed in this encounter.     Procedures: No procedures performed   Clinical Data: No additional findings.   Subjective: Chief Complaint  Patient presents with  . Right Hip - Pain    Mr. Denker returns today for follow-up of his right femoral head avascular necrosis.  He has secured stable housing since we last saw him.  He is eager to move forward with a right hip replacement.  No changes in medical history.  He continues to smoke about half pack of cigarettes a day.  Otherwise medically unremarkable.   Review of Systems  Constitutional: Negative.   All other systems reviewed and are negative.    Objective: Vital Signs: There were no vitals taken for this visit.  Physical Exam Vitals and nursing note reviewed.  Constitutional:      Appearance: He is well-developed.  Pulmonary:     Effort: Pulmonary effort is normal.  Abdominal:     Palpations: Abdomen is soft.  Skin:    General: Skin is warm.  Neurological:   Mental Status: He is alert and oriented to person, place, and time.  Psychiatric:        Behavior: Behavior normal.        Thought Content: Thought content normal.        Judgment: Judgment normal.     Ortho Exam Right hip exam shows shortened right leg.  He has an antalgic gait secondary to severe pain and very limited to the range of motion.  He ambulates with a pair crutches. Specialty Comments:  No specialty comments available.  Imaging: No results found.   PMFS History: Patient Active Problem List   Diagnosis Date Noted  . Vitamin D deficiency 01/19/2020  . Tobacco use disorder 01/18/2020  . Alcohol use 01/18/2020  . Post-traumatic osteoarthritis of right hip 01/18/2020  . Essential hypertension 01/18/2020  . Encounter for screening for HIV 01/18/2020  . Moderate episode of recurrent major depressive disorder ([place]) 01/18/2020  . Alcohol abuse with alcohol-induced mood disorder ([place]) 12/17/2019   Past Medical History:  Diagnosis Date  . Hypertension     Family History  Problem Relation Age of Onset  . Heart disease Mother     Past Surgical History:  Procedure Laterality Date  . APPENDECTOMY    . TONSILLECTOMY     Social History   Occupational History  .  Not on file  Tobacco Use  . Smoking status: Current Every Day Smoker    Packs/day: 0.50  . Smokeless tobacco: Never Used  Substance and Sexual Activity  . Alcohol use: Yes    Comment: amount varies  . Drug use: Not Currently    Types: Cocaine    Comment: hx of cocaine use, states that he has been clean five years  . Sexual activity: Not Currently

## 2020-03-26 NOTE — Progress Notes (Signed)
[facility] [phone] [place] Otto, [place] - [address] [place] [address] Phone: [phone] Fax: [phone]  [facility] - [place], [place] - [address] [facility] [address] [place] [address] Phone: [phone] Fax: [phone]  [facility] - [place], [place] - [address] [place] [address] Phone: [phone] Fax: [phone]  [facility] - [place], [place] - [address] [place] [address] Phone: [phone] Fax: [phone]  [facility] [address] [place]), [place] - [address] [place] [address] Phone: [phone] Fax: [phone]      Your procedure is scheduled on Monday, April 19th.  Report to [facility] Main Entrance "A" at 8:00 A.M., and check in at the Admitting office.  Call this number if you have problems the morning of surgery:  [phone]  Call [phone] if you have any questions prior to your surgery date Monday-Friday 8am-4pm    Remember:  Do not eat after midnight the night before your surgery  You may drink clear liquids until 7:00 AM the morning of your surgery.   Clear liquids allowed are: Water, Non-Citrus Juices (without pulp), Carbonated Beverages, Clear Tea, Black Coffee Only, and Gatorade   Enhanced Recovery after Surgery for Orthopedics Enhanced Recovery after Surgery is a protocol used to improve the stress on your body and your recovery after surgery.  Patient Instructions  . The night before surgery:  o No food after midnight. ONLY clear liquids after midnight o Finish Ensure by 6:55 AM  .  . The day of surgery (if you do NOT have diabetes):  o Drink ONE (1) Pre-Surgery Clear Ensure as directed.   o This drink was given to you during your hospital  pre-op appointment visit. o The pre-op nurse will instruct you on the time to drink the    Pre-Surgery Ensure depending on your surgery time. o Finish the drink at the designated time by the pre-op nurse.  o Nothing else to drink after completing the  Pre-Surgery Clear Ensure.         If you have questions, please contact your surgeon's office.     Take these medicines the morning of surgery with A SIP OF WATER   Sertraline (Zoloft)  As of today, STOP taking any Aspirin (unless otherwise instructed by your surgeon) and Aspirin containing products, Aleve, Naproxen, Ibuprofen, Motrin, Advil, Goody's, BC's, all herbal medications, fish oil, and all vitamins.                      Do not wear jewelry, make up, or nail polish            Do not wear lotions, powders, colognes, or deodorant.            Men may shave face and neck.            Do not bring valuables to the hospital.            [facility] is not responsible for any belongings or valuables.  Do NOT Smoke (Tobacco/Vapping) or drink Alcohol 24 hours prior to your procedure If you use a CPAP at night, you may bring all equipment for your overnight stay.   Contacts, glasses, dentures or bridgework may not be worn into surgery.      For patients admitted to the hospital, discharge time will be  determined by your treatment team.   Patients discharged the day of surgery will not be allowed to drive home, and someone needs to stay with them for 24 hours.    Special instructions:   [facility]- Preparing For Surgery  Before surgery, you can play an important role. Because skin is not sterile, your skin needs to be as free of germs as possible. You can reduce the number of germs on your skin by washing with CHG (chlorahexidine gluconate) Soap before surgery.  CHG is an antiseptic cleaner which kills germs and bonds with the skin to continue killing germs even after washing.    Oral Hygiene is also important to reduce your risk of infection.  Remember - BRUSH YOUR TEETH THE MORNING OF SURGERY WITH YOUR REGULAR  TOOTHPASTE  Please do not use if you have an allergy to CHG or antibacterial soaps. If your skin becomes reddened/irritated stop using the CHG.  Do not shave (including legs and underarms) for at least 48 hours prior to first CHG shower. It is OK to shave your face.  Please follow these instructions carefully.   1. Shower the NIGHT BEFORE SURGERY and the MORNING OF SURGERY with CHG Soap.   2. If you chose to wash your hair, wash your hair first as usual with your normal shampoo.  3. After you shampoo, rinse your hair and body thoroughly to remove the shampoo.  4. Use CHG as you would any other liquid soap. You can apply CHG directly to the skin and wash gently with a scrungie or a clean washcloth.   5. Apply the CHG Soap to your body ONLY FROM THE NECK DOWN.  Do not use on open wounds or open sores. Avoid contact with your eyes, ears, mouth and genitals (private parts). Wash Face and genitals (private parts)  with your normal soap.   6. Wash thoroughly, paying special attention to the area where your surgery will be performed.  7. Thoroughly rinse your body with warm water from the neck down.  8. DO NOT shower/wash with your normal soap after using and rinsing off the CHG Soap.  9. Pat yourself dry with a CLEAN TOWEL.  10. Wear CLEAN PAJAMAS to bed the night before surgery, wear comfortable clothes the morning of surgery  11. Place CLEAN SHEETS on your bed the night of your first shower and DO NOT SLEEP WITH PETS.   Day of Surgery:   Do not apply any deodorants/lotions.  Please wear clean clothes to the hospital/surgery center.   Remember to brush your teeth WITH YOUR REGULAR TOOTHPASTE.   Please read over the following fact sheets that you were given.

## 2020-03-27 ENCOUNTER — Encounter (HOSPITAL_COMMUNITY)
Admission: RE | Admit: 2020-03-27 | Discharge: 2020-03-27 | Disposition: A | Discharge status: Home / Self Care | Payer: Self-pay | Source: Ambulatory Visit | Attending: Orthopaedic Surgery | Admitting: Orthopaedic Surgery

## 2020-03-27 ENCOUNTER — Ambulatory Visit (HOSPITAL_COMMUNITY)
Admission: RE | Admit: 2020-03-27 | Discharge: 2020-03-27 | Disposition: A | Discharge status: Home / Self Care | Payer: Self-pay | Source: Ambulatory Visit | Attending: Physician Assistant | Admitting: Physician Assistant

## 2020-03-27 ENCOUNTER — Encounter (HOSPITAL_COMMUNITY): Payer: Self-pay

## 2020-03-27 ENCOUNTER — Other Ambulatory Visit: Payer: Self-pay

## 2020-03-27 DIAGNOSIS — M1611 Unilateral primary osteoarthritis, right hip: Secondary | ICD-10-CM

## 2020-03-27 HISTORY — DX: Unspecified intracranial injury with loss of consciousness of unspecified duration, initial encounter: S06.9X9A

## 2020-03-27 HISTORY — DX: Type 2 diabetes mellitus without complications: E11.9

## 2020-03-27 LAB — PROTIME-INR
INR: 0.9 (ref 0.8–1.2)
Prothrombin Time: 12.5 seconds (ref 11.4–15.2)

## 2020-03-27 LAB — URINALYSIS, ROUTINE W REFLEX MICROSCOPIC
Bilirubin Urine: NEGATIVE
Glucose, UA: NEGATIVE mg/dL
Hgb urine dipstick: NEGATIVE
Ketones, ur: NEGATIVE mg/dL
Leukocytes,Ua: NEGATIVE
Nitrite: NEGATIVE
Protein, ur: NEGATIVE mg/dL
Specific Gravity, Urine: 1.016 (ref 1.005–1.030)
pH: 5 (ref 5.0–8.0)

## 2020-03-27 LAB — COMPREHENSIVE METABOLIC PANEL
ALT: 37 U/L (ref 0–44)
AST: 34 U/L (ref 15–41)
Albumin: 3.4 g/dL — ABNORMAL LOW (ref 3.5–5.0)
Alkaline Phosphatase: 73 U/L (ref 38–126)
Anion gap: 10 (ref 5–15)
BUN: 13 mg/dL (ref 6–20)
CO2: 28 mmol/L (ref 22–32)
Calcium: 9.3 mg/dL (ref 8.9–10.3)
Chloride: 100 mmol/L (ref 98–111)
Creatinine, Ser: 1.11 mg/dL (ref 0.61–1.24)
GFR calc Af Amer: >60 mL/min (ref >60)
GFR calc non Af Amer: >60 mL/min (ref >60)
Glucose, Bld: 123 mg/dL — ABNORMAL HIGH (ref 70–99)
Potassium: 4.4 mmol/L (ref 3.5–5.1)
Sodium: 138 mmol/L (ref 135–145)
Total Bilirubin: 0.5 mg/dL (ref 0.3–1.2)
Total Protein: 6.8 g/dL (ref 6.5–8.1)

## 2020-03-27 LAB — CBC WITH DIFFERENTIAL/PLATELET
Abs Immature Granulocytes: 0.02 10*3/uL (ref 0.00–0.07)
Basophils Absolute: 0 10*3/uL (ref 0.0–0.1)
Basophils Relative: 1 %
Eosinophils Absolute: 0.4 10*3/uL (ref 0.0–0.5)
Eosinophils Relative: 6 %
HCT: 44.4 % (ref 39.0–52.0)
Hemoglobin: 14.4 g/dL (ref 13.0–17.0)
Immature Granulocytes: 0 %
Lymphocytes Relative: 32 %
Lymphs Abs: 2.2 10*3/uL (ref 0.7–4.0)
MCH: 30.1 pg (ref 26.0–34.0)
MCHC: 32.4 g/dL (ref 30.0–36.0)
MCV: 92.9 fL (ref 80.0–100.0)
Monocytes Absolute: 0.5 10*3/uL (ref 0.1–1.0)
Monocytes Relative: 8 %
Neutro Abs: 3.5 10*3/uL (ref 1.7–7.7)
Neutrophils Relative %: 53 %
Platelets: 277 10*3/uL (ref 150–400)
RBC: 4.78 MIL/uL (ref 4.22–5.81)
RDW: 12.3 % (ref 11.5–15.5)
WBC: 6.7 10*3/uL (ref 4.0–10.5)
nRBC: 0 % (ref 0.0–0.2)

## 2020-03-27 LAB — APTT: aPTT: 26 seconds (ref 24–36)

## 2020-03-27 LAB — SURGICAL PCR SCREEN
MRSA, PCR: NEGATIVE
Staphylococcus aureus: POSITIVE — AB

## 2020-03-27 LAB — TYPE AND SCREEN
ABO/RH(D): A POS
Antibody Screen: NEGATIVE

## 2020-03-27 LAB — ABO/RH: ABO/RH(D): A POS

## 2020-03-27 MED FILL — MUPIROCIN 2% OINTMENT: 2 | 5 days supply | Qty: 22 | Fill #0

## 2020-03-27 NOTE — Progress Notes (Addendum)
PCP - Dr. Shan Levans  Cardiologist - no  Chest x-ray - 03/27/2020  EKG - 03/27/2020  Stress Test - no  ECHO - no  Cardiac Cath - no  Sleep Study - no CPAP - no  LABS-CBC with diff, T/S, CMP,  PT, PTT,  PCR  ASA-no  ERAS-yes- clears until; 0655- Pre- Surgery Ensure  HA1C- Fasting Blood Sugar - doesn't check Checks Blood Sugar _0____ times a day Chad Bautista reports that he did have diabetes in the past, he lost a lot of weight and no longer has it. Patient does not check CBG's   Anesthesia-  Pt denies having chest pain, sob, or fever at this time. All instructions explained to the pt, with a verbal understanding of the material. Pt agrees to go over the instructions while at home for a better understanding. Pt also instructed to self quarantine after being tested for COVID-19. The opportunity to ask questions was provided.  Chad Bautista is mobile using  crutches, he takes th bus to run errands, I informed patient that he cannot ride the bus to have Covid- "but that is how I get around."  I asked patient to co me to the hospital 3 hours prior to Surgery and test will be done then.  Chad Bautista will not be able to purchase Muporiocin if needed.  Chad Bautista is seen at [facility], he reports that he has been clean for a few months, he recently was stared on Subutex, I asked if there is a plan in place if he has surgery," patient said - I am suppose to take it every am."

## 2020-03-27 NOTE — Progress Notes (Addendum)
PCP - Dr. Shan Levans  Cardiologist - no  Chest x-ray - 03/27/2020  EKG - 03/27/2020  Stress Test - no  ECHO - no  Cardiac Cath - no  Sleep Study - no CPAP - no  LABS-CBC with diff, T/S, CMP,  PT, PTT,  PCR  ASA-no  ERAS-yes- clears until; 0655- Pre- SurgeEnsure  HA1C-pending Fasting Blood Sugar - doesn't check Checks Blood Sugar 0_ times a day  Anesthesia-  Pt denies having chest pain, sob, or fever at this time. All instructions explained to the pt, with a verbal understanding of the material. Pt agrees to go over the instructions while at home for a better understanding. Pt also instructed to self quarantine after being tested for COVID-19. The opportunity to ask questions was provided. Chad Bautista repoprts that he did have diabetes in the past, but lost a lot of weight and no longer is a diabetic, patient does not check CBG's. I called [facility] with prescription order for Mupirocin am requesting anesthesia PA-C to review chart.

## 2020-03-27 NOTE — Progress Notes (Addendum)
Your procedure is scheduled on Monday, April 19th.  Report to [facility] Main Entrance "A" at 6:55 A.M., and check in at the Admitting office.               Your surgery or procedure is scheduled for 9:55 AM  Call this number if you have problems the morning of surgery:  [phone]  Call [phone] if you have any questions prior to your surgery date Monday-Friday 8am-4pm   Remember:  Do not eat after midnight the night before your surgery  You may drink clear liquids until 7:00 AM the morning of your surgery.   Clear liquids allowed are: Water, Non-Citrus Juices (without pulp), Carbonated Beverages, Clear Tea, Black Coffee Only, and Gatorade   Enhanced Recovery after Surgery for Orthopedics Enhanced Recovery after Surgery is a protocol used to improve the stress on your body and your recovery after surgery.  Patient Instructions  . The night before surgery:  o No food after midnight. ONLY clear liquids after midnight o Finish Ensure by 6:55 AM  .  . The day of surgery (if you do NOT have diabetes):   Nothing else to drink after completing the  Pre-Surgery Clear Ensure.     Take these medicines the morning of surgery with A SIP OF WATER   Sertraline (Zoloft)             Subtex  As of today, STOP taking any Aspirin (unless otherwise instructed by your surgeon) and Aspirin containing products, Aleve, Naproxen, Ibuprofen, Motrin, Advil, Goody's, BC's, all herbal medications, fish oil, and all vitamins.         Special instructions:   [facility] [facility]- Preparing For Surgery  Before surgery, you can play an important role. Because skin is not sterile, your skin needs to be as free of germs as possible. You can reduce the number of germs on your skin by washing with CHG (chlorahexidine gluconate) Soap before surgery.  CHG is an antiseptic cleaner which kills germs and bonds with the skin to continue killing germs even after washing.    Oral Hygiene is also important to  reduce your risk of infection.  Remember - BRUSH YOUR TEETH THE MORNING OF SURGERY WITH YOUR REGULAR TOOTHPASTE  Please do not use if you have an allergy to CHG or antibacterial soaps. If your skin becomes reddened/irritated stop using the CHG.  Do not shave (including legs and underarms) for at least 48 hours prior to first CHG shower. It is OK to shave your face.  Please follow these instructions carefully.   1. Shower the NIGHT BEFORE SURGERY and the MORNING OF SURGERY with CHG Soap.   2. If you chose to wash your hair, wash your hair first as usual with your normal shampoo.  3. After you shampoo,wash your face and private area with the soap you use at home, then rinse your hair and body thoroughly to remove the shampoo and soap.  4. Use CHG as you would any other liquid soap. You can apply CHG directly to the skin and wash gently with a scrungie or a clean washcloth.   5. Apply the CHG Soap to your body ONLY FROM THE NECK DOWN.  Do not use on open wounds or open sores. Avoid contact with your eyes, ears, mouth and genitals (private parts).   6. Wash thoroughly, paying special attention to the area where your surgery will be performed.  7. Thoroughly rinse your body with warm water from  the neck down.  8. DO NOT shower/wash with your normal soap after using and rinsing off the CHG Soap.  9. Pat yourself dry with a CLEAN TOWEL.  10. Wear CLEAN PAJAMAS to bed the night before surgery, wear comfortable clothes the morning of surgery  11. Place CLEAN SHEETS on your bed the night of your first shower and DO NOT SLEEP WITH PETS.  Day of Surgery: Shower as instructed above.           Do not wear lotions, powders, colognes, or deodorant. Men may shave face and neck Please wear clean clothes to the hospital/surgery center.   Remember to brush your teeth WITH YOUR REGULAR TOOTHPASTE Do not wear jewelry, make up, or nail polish            Do not wear lotions, powders, colognes, or  deodorant.            Men may shave face and neck.            Do not bring valuables to the hospital.            [facility] is not responsible for any belongings or valuables.  Do NOT Smoke (Tobacco/Vapping) or drink Alcohol 24 hours prior to your procedure If you use a CPAP at night, you may bring all equipment for your overnight stay.   Contacts, glasses, dentures or bridgework may not be worn into surgery.      For patients admitted to the hospital, discharge time will be determined by your treatment team.   Patients discharged the day of surgery will not be allowed to drive home, and someone needs to stay with them for 24 hours.

## 2020-03-28 NOTE — Progress Notes (Addendum)
Anesthesia Chart Review:  Case: [id number] Date/Time: 04/02/20 0940   Procedure: RIGHT TOTAL HIP ARTHROPLASTY ANTERIOR APPROACH (Right Hip)   Anesthesia type: Spinal   Pre-op diagnosis: right hip avascular necrosis   [facility]: [facility] ROOM 04 / [facility] OR   Surgeons: Tarry Kos, MD      DISCUSSION: Patient is a 44 year old male scheduled for the above procedure.  History includes smoking, HTN, head injury/LOC (history of). 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  He is on Subutex. Also reported previous diagnosis of DM2, but lost weight and has not had any further issues. A1c with PAT labs resulted at 6.8%.     Apparently, patient's only transportation is via bus, so he is arriving 3 hours prior to surgery for same day COVID-19 test. Also he recently starting on Subutex (buprenorphine) 8 mg SL daily with reported plan to continue perioperatively. Anesthesiologist to discuss anesthesia plan on the day of surgery.    VS: BP 106/67   Pulse 80   Temp (!) 36.3 C (Oral)   Resp 18   Ht 6\' 3"  (1.905 m)   Wt 100.1 kg   SpO2 98%   BMI 27.57 kg/m     PROVIDERS: , MD is PCP [facility])   LABS: Labs reviewed: Acceptable for surgery. A1c 6.8%.  (all labs ordered are listed, but only abnormal results are displayed)  Labs Reviewed  SURGICAL PCR SCREEN - Abnormal; Notable for the following components:      Result Value   Staphylococcus aureus POSITIVE (*)    All other components within normal limits  COMPREHENSIVE METABOLIC PANEL - Abnormal; Notable for the following components:   Glucose, Bld 123 (*)    Albumin 3.4 (*)    All other components within normal limits  URINALYSIS, ROUTINE W REFLEX MICROSCOPIC - Abnormal; Notable for the following components:   Bacteria, UA FEW (*)    All other components within normal limits  APTT  CBC WITH DIFFERENTIAL/PLATELET  PROTIME-INR  TYPE AND SCREEN  ABO/RH     IMAGES: CXR  03/27/20: FINDINGS: Mediastinum and hilar structures normal. Lungs are clear. No pleural effusion or pneumothorax. No acute bony abnormality. IMPRESSION: No acute cardiopulmonary disease.    EKG: Normal sinus rhythm Nonspecific ST and T wave abnormality Abnormal ECG No previous tracing Confirmed by 03/29/20 [phone]) on 03/27/2020 1:52:57 PM   CV: N/A  Past Medical History:  Diagnosis Date  . Diabetes mellitus without complication (HCC)   . Head injury with loss of consciousness (HCC)   . Hypertension     Past Surgical History:  Procedure Laterality Date  . ANKLE FRACTURE SURGERY    . APPENDECTOMY    . NASAL SEPTUM SURGERY    . TONSILLECTOMY      MEDICATIONS: . buprenorphine (SUBUTEX) 8 MG SUBL SL tablet  . ibuprofen (ADVIL) 200 MG tablet  . losartan-hydrochlorothiazide (HYZAAR) 100-25 MG tablet  . sertraline (ZOLOFT) 50 MG tablet  . Vitamin D, Ergocalciferol, (DRISDOL) 1.25 MG (50000 UNIT) CAPS capsule   No current facility-administered medications for this encounter.  He is not currently taking Zoloft of Vitamin D.   03/29/2020, PA-C Surgical Short Stay/Anesthesiology [facility] Phone [phone] [facility] Phone [phone] 03/29/2020 9:08 AM

## 2020-03-29 ENCOUNTER — Other Ambulatory Visit (HOSPITAL_COMMUNITY): Payer: Medicaid Other

## 2020-03-29 ENCOUNTER — Encounter (HOSPITAL_COMMUNITY): Payer: Self-pay

## 2020-03-29 LAB — HEMOGLOBIN A1C
Hgb A1c MFr Bld: 6.8 % — ABNORMAL HIGH (ref 4.8–5.6)
Mean Plasma Glucose: 148 mg/dL

## 2020-03-29 NOTE — Anesthesia Preprocedure Evaluation (Addendum)
Anesthesia Evaluation  Patient identified by MRN, date of birth, ID band Patient awake    Reviewed: Allergy & Precautions, NPO status , Patient's Chart, lab work & pertinent test results  Airway Mallampati: II  TM Distance: >3 FB Neck ROM: Full    Dental  (+) Teeth Intact, Dental Advisory Given, Chipped,    Pulmonary Current SmokerPatient did not abstain from smoking.,    Pulmonary exam normal breath sounds clear to auscultation       Cardiovascular hypertension, Pt. on medications Normal cardiovascular exam Rhythm:Regular Rate:Normal     Neuro/Psych Seizures -,  PSYCHIATRIC DISORDERS Depression    GI/Hepatic negative GI ROS, (+)     substance abuse (5 years ago)  cocaine use,   Endo/Other  diabetes, Well Controlled, Type 2  Renal/GU negative Renal ROS     Musculoskeletal  right hip avascular necrosis   Abdominal   Peds  Hematology negative hematology ROS (+)   Anesthesia Other Findings Day of surgery medications reviewed with the patient.  Buprenorphine use   Reproductive/Obstetrics                            Anesthesia Physical Anesthesia Plan  ASA: II  Anesthesia Plan: Spinal   Post-op Pain Management:    Induction: Intravenous  PONV Risk Score and Plan: 0 and Propofol infusion, Treatment may vary due to age or medical condition, Midazolam and Ondansetron  Airway Management Planned: Natural Airway and Nasal Cannula  Additional Equipment:   Intra-op Plan:   Post-operative Plan:   Informed Consent: I have reviewed the patients History and Physical, chart, labs and discussed the procedure including the risks, benefits and alternatives for the proposed anesthesia with the patient or authorized representative who has indicated his/her understanding and acceptance.     Dental advisory given  Plan Discussed with: CRNA  Anesthesia Plan Comments: (PAT note written 03/29/2020  by Shonna Chock, PA-C. )      Anesthesia Quick Evaluation

## 2020-03-30 MED ORDER — BUPIVACAINE LIPOSOME 1.3 % IJ SUSP
10.0000 mL | Freq: Once | INTRAMUSCULAR | Status: DC
  Filled 2020-03-30: qty 10

## 2020-03-30 MED ORDER — TRANEXAMIC ACID 1000 MG/10ML IV SOLN
2000.0000 mg | INTRAVENOUS | Status: AC
  Administered 2020-04-02: 2000 mg via TOPICAL
  Filled 2020-03-30: qty 20

## 2020-04-02 ENCOUNTER — Encounter (HOSPITAL_COMMUNITY)
Admission: RE | Disposition: A | Discharge status: Home / Self Care | Payer: Self-pay | Source: Home / Self Care | Attending: Orthopaedic Surgery

## 2020-04-02 ENCOUNTER — Encounter (HOSPITAL_COMMUNITY): Payer: Self-pay | Admitting: Orthopaedic Surgery

## 2020-04-02 ENCOUNTER — Ambulatory Visit (HOSPITAL_COMMUNITY): Payer: Self-pay

## 2020-04-02 ENCOUNTER — Ambulatory Visit (HOSPITAL_COMMUNITY): Payer: Self-pay | Admitting: Vascular Surgery

## 2020-04-02 ENCOUNTER — Other Ambulatory Visit: Payer: Self-pay

## 2020-04-02 ENCOUNTER — Observation Stay (HOSPITAL_COMMUNITY): Payer: Self-pay

## 2020-04-02 ENCOUNTER — Observation Stay (HOSPITAL_COMMUNITY)
Admission: RE | Admit: 2020-04-02 | Discharge: 2020-04-03 | Disposition: A | Discharge status: Home / Self Care | Payer: Self-pay | Attending: Orthopaedic Surgery | Admitting: Orthopaedic Surgery

## 2020-04-02 DIAGNOSIS — I10 Essential (primary) hypertension: Secondary | ICD-10-CM | POA: Insufficient documentation

## 2020-04-02 DIAGNOSIS — Z96641 Presence of right artificial hip joint: Secondary | ICD-10-CM

## 2020-04-02 DIAGNOSIS — M87851 Other osteonecrosis, right femur: Principal | ICD-10-CM | POA: Insufficient documentation

## 2020-04-02 DIAGNOSIS — M87051 Idiopathic aseptic necrosis of right femur: Secondary | ICD-10-CM

## 2020-04-02 DIAGNOSIS — Z419 Encounter for procedure for purposes other than remedying health state, unspecified: Secondary | ICD-10-CM

## 2020-04-02 DIAGNOSIS — E119 Type 2 diabetes mellitus without complications: Secondary | ICD-10-CM | POA: Insufficient documentation

## 2020-04-02 DIAGNOSIS — Z96649 Presence of unspecified artificial hip joint: Secondary | ICD-10-CM

## 2020-04-02 DIAGNOSIS — F329 Major depressive disorder, single episode, unspecified: Secondary | ICD-10-CM | POA: Insufficient documentation

## 2020-04-02 DIAGNOSIS — Z20822 Contact with and (suspected) exposure to covid-19: Secondary | ICD-10-CM | POA: Insufficient documentation

## 2020-04-02 DIAGNOSIS — F17210 Nicotine dependence, cigarettes, uncomplicated: Secondary | ICD-10-CM | POA: Insufficient documentation

## 2020-04-02 HISTORY — PX: TOTAL HIP ARTHROPLASTY: SHX124

## 2020-04-02 LAB — GLUCOSE, CAPILLARY
Glucose-Capillary: 129 mg/dL — ABNORMAL HIGH (ref 70–99)
Glucose-Capillary: 120 mg/dL — ABNORMAL HIGH (ref 70–99)
Glucose-Capillary: 103 mg/dL — ABNORMAL HIGH (ref 70–99)

## 2020-04-02 LAB — RESPIRATORY PANEL BY RT PCR (FLU A&B, COVID)
Influenza A by PCR: NEGATIVE
Influenza B by PCR: NEGATIVE
SARS Coronavirus 2 by RT PCR: NEGATIVE

## 2020-04-02 SURGERY — ARTHROPLASTY, HIP, TOTAL, ANTERIOR APPROACH
Anesthesia: Spinal | Site: Hip | Laterality: Right

## 2020-04-02 MED ORDER — MIDAZOLAM HCL 2 MG/2ML IJ SOLN
INTRAMUSCULAR | Status: AC
  Filled 2020-04-02: qty 2

## 2020-04-02 MED ORDER — FENTANYL CITRATE (PF) 250 MCG/5ML IJ SOLN
INTRAMUSCULAR | Status: AC
  Filled 2020-04-02: qty 5

## 2020-04-02 MED ORDER — METHOCARBAMOL 750 MG PO TABS
750.0000 mg | ORAL_TABLET | Freq: Four times a day (QID) | ORAL | Status: DC | PRN
  Administered 2020-04-02 – 2020-04-03 (×3): 750 mg via ORAL
  Filled 2020-04-02 (×2): qty 1

## 2020-04-02 MED ORDER — HYDROMORPHONE HCL 1 MG/ML IJ SOLN
0.5000 mg | INTRAMUSCULAR | Status: DC | PRN
  Administered 2020-04-02: 1 mg via INTRAVENOUS
  Filled 2020-04-02: qty 1

## 2020-04-02 MED ORDER — LACTATED RINGERS IV SOLN: INTRAVENOUS | Status: DC

## 2020-04-02 MED ORDER — ACETAMINOPHEN 500 MG PO TABS
1000.0000 mg | ORAL_TABLET | Freq: Four times a day (QID) | ORAL | Status: AC
  Administered 2020-04-02 – 2020-04-03 (×2): 1000 mg via ORAL
  Filled 2020-04-02 (×4): qty 2

## 2020-04-02 MED ORDER — PHENOL 1.4 % MT LIQD: 1.0000 | OROMUCOSAL | Status: DC | PRN

## 2020-04-02 MED ORDER — MENTHOL 3 MG MT LOZG: 1.0000 | LOZENGE | OROMUCOSAL | Status: DC | PRN

## 2020-04-02 MED ORDER — LOSARTAN POTASSIUM-HCTZ 100-25 MG PO TABS: 1.0000 | ORAL_TABLET | Freq: Every day | ORAL | Status: DC

## 2020-04-02 MED ORDER — TRANEXAMIC ACID-NACL 1000-0.7 MG/100ML-% IV SOLN
1000.0000 mg | Freq: Once | INTRAVENOUS | Status: AC
  Administered 2020-04-02: 1000 mg via INTRAVENOUS
  Filled 2020-04-02: qty 100

## 2020-04-02 MED ORDER — SORBITOL 70 % SOLN
30.0000 mL | Freq: Every day | Status: DC | PRN
  Filled 2020-04-02: qty 30

## 2020-04-02 MED ORDER — VANCOMYCIN HCL 1000 MG IV SOLR
INTRAVENOUS | Status: AC
  Filled 2020-04-02: qty 1000

## 2020-04-02 MED ORDER — CEFAZOLIN SODIUM-DEXTROSE 2-4 GM/100ML-% IV SOLN
INTRAVENOUS | Status: AC
  Filled 2020-04-02: qty 100

## 2020-04-02 MED ORDER — OXYCODONE HCL 5 MG PO TABS
10.0000 mg | ORAL_TABLET | ORAL | Status: DC | PRN
  Administered 2020-04-02 – 2020-04-03 (×4): 15 mg via ORAL
  Filled 2020-04-02 (×4): qty 3

## 2020-04-02 MED ORDER — EPINEPHRINE PF 1 MG/ML IJ SOLN
INTRAMUSCULAR | Status: AC
  Filled 2020-04-02: qty 1

## 2020-04-02 MED ORDER — SODIUM CHLORIDE 0.9% FLUSH
INTRAVENOUS | Status: DC | PRN
  Administered 2020-04-02: 20 mL

## 2020-04-02 MED ORDER — EPINEPHRINE PF 1 MG/ML IJ SOLN
INTRAMUSCULAR | Status: DC | PRN
  Administered 2020-04-02: .15 mL

## 2020-04-02 MED ORDER — KETAMINE HCL 10 MG/ML IJ SOLN
INTRAMUSCULAR | Status: DC | PRN
  Administered 2020-04-02: 10 mg via INTRAVENOUS
  Administered 2020-04-02: 20 mg via INTRAVENOUS

## 2020-04-02 MED ORDER — FENTANYL CITRATE (PF) 100 MCG/2ML IJ SOLN
INTRAMUSCULAR | Status: DC | PRN
  Administered 2020-04-02 (×2): 50 ug via INTRAVENOUS

## 2020-04-02 MED ORDER — ALUM & MAG HYDROXIDE-SIMETH 200-200-20 MG/5ML PO SUSP: 30.0000 mL | ORAL | Status: DC | PRN

## 2020-04-02 MED ORDER — BUPIVACAINE IN DEXTROSE 0.75-8.25 % IT SOLN
INTRATHECAL | Status: DC | PRN
  Administered 2020-04-02: 1.6 mL via INTRATHECAL

## 2020-04-02 MED ORDER — 0.9 % SODIUM CHLORIDE (POUR BTL) OPTIME
TOPICAL | Status: DC | PRN
  Administered 2020-04-02: 1000 mL

## 2020-04-02 MED ORDER — BUPRENORPHINE HCL-NALOXONE HCL 8-2 MG SL SUBL: 1.0000 | SUBLINGUAL_TABLET | Freq: Every day | SUBLINGUAL | Status: DC

## 2020-04-02 MED ORDER — METHOCARBAMOL 500 MG PO TABS
ORAL_TABLET | ORAL | Status: AC
  Filled 2020-04-02: qty 2

## 2020-04-02 MED ORDER — KETAMINE HCL 50 MG/5ML IJ SOSY
PREFILLED_SYRINGE | INTRAMUSCULAR | Status: AC
  Filled 2020-04-02: qty 5

## 2020-04-02 MED ORDER — SODIUM CHLORIDE 0.9 % IR SOLN
Status: DC | PRN
  Administered 2020-04-02: 3000 mL

## 2020-04-02 MED ORDER — CEFAZOLIN SODIUM-DEXTROSE 2-4 GM/100ML-% IV SOLN
2.0000 g | INTRAVENOUS | Status: AC
  Administered 2020-04-02: 2 g via INTRAVENOUS

## 2020-04-02 MED ORDER — POVIDONE-IODINE 10 % EX SWAB
2.0000 "application " | Freq: Once | CUTANEOUS | Status: AC
  Administered 2020-04-02: 2 via TOPICAL

## 2020-04-02 MED ORDER — ONDANSETRON HCL 4 MG PO TABS: 4.0000 mg | ORAL_TABLET | Freq: Four times a day (QID) | ORAL | Status: DC | PRN

## 2020-04-02 MED ORDER — TRANEXAMIC ACID-NACL 1000-0.7 MG/100ML-% IV SOLN
1000.0000 mg | INTRAVENOUS | Status: AC
  Administered 2020-04-02: 1000 mg via INTRAVENOUS

## 2020-04-02 MED ORDER — DIPHENHYDRAMINE HCL 12.5 MG/5ML PO ELIX
25.0000 mg | ORAL_SOLUTION | ORAL | Status: DC | PRN
  Filled 2020-04-02: qty 10

## 2020-04-02 MED ORDER — DIPHENHYDRAMINE HCL 50 MG/ML IJ SOLN
INTRAMUSCULAR | Status: AC
  Filled 2020-04-02: qty 1

## 2020-04-02 MED ORDER — BUPIVACAINE HCL (PF) 0.25 % IJ SOLN
INTRAMUSCULAR | Status: DC | PRN
  Administered 2020-04-02: 20 mL

## 2020-04-02 MED ORDER — OXYCODONE HCL ER 10 MG PO T12A
10.0000 mg | EXTENDED_RELEASE_TABLET | Freq: Two times a day (BID) | ORAL | Status: DC
  Administered 2020-04-02 – 2020-04-03 (×3): 10 mg via ORAL
  Filled 2020-04-02 (×3): qty 1

## 2020-04-02 MED ORDER — ACETAMINOPHEN 500 MG PO TABS
ORAL_TABLET | ORAL | Status: AC
  Administered 2020-04-02: 1000 mg via ORAL
  Filled 2020-04-02: qty 2

## 2020-04-02 MED ORDER — CEFAZOLIN SODIUM-DEXTROSE 2-4 GM/100ML-% IV SOLN
2.0000 g | Freq: Four times a day (QID) | INTRAVENOUS | Status: AC
  Administered 2020-04-02 – 2020-04-03 (×3): 2 g via INTRAVENOUS
  Filled 2020-04-02 (×3): qty 100

## 2020-04-02 MED ORDER — ONDANSETRON HCL 4 MG/2ML IJ SOLN: 4.0000 mg | Freq: Four times a day (QID) | INTRAMUSCULAR | Status: DC | PRN

## 2020-04-02 MED ORDER — POLYETHYLENE GLYCOL 3350 17 G PO PACK: 17.0000 g | PACK | Freq: Every day | ORAL | Status: DC | PRN

## 2020-04-02 MED ORDER — SODIUM CHLORIDE 0.9 % IV SOLN: INTRAVENOUS | Status: DC

## 2020-04-02 MED ORDER — OXYCODONE HCL 5 MG PO TABS
5.0000 mg | ORAL_TABLET | ORAL | Status: DC | PRN
  Administered 2020-04-03: 10 mg via ORAL
  Filled 2020-04-02: qty 2

## 2020-04-02 MED ORDER — ONDANSETRON HCL 4 MG/2ML IJ SOLN
INTRAMUSCULAR | Status: DC | PRN
  Administered 2020-04-02: 4 mg via INTRAVENOUS

## 2020-04-02 MED ORDER — MIDAZOLAM HCL 5 MG/5ML IJ SOLN
INTRAMUSCULAR | Status: DC | PRN
  Administered 2020-04-02: 2 mg via INTRAVENOUS

## 2020-04-02 MED ORDER — TRANEXAMIC ACID-NACL 1000-0.7 MG/100ML-% IV SOLN
INTRAVENOUS | Status: AC
  Filled 2020-04-02: qty 100

## 2020-04-02 MED ORDER — VANCOMYCIN HCL 1 G IV SOLR
INTRAVENOUS | Status: DC | PRN
  Administered 2020-04-02: 1000 mg

## 2020-04-02 MED ORDER — KETOROLAC TROMETHAMINE 15 MG/ML IJ SOLN
15.0000 mg | Freq: Four times a day (QID) | INTRAMUSCULAR | Status: AC
  Administered 2020-04-02 – 2020-04-03 (×4): 15 mg via INTRAVENOUS
  Filled 2020-04-02 (×4): qty 1

## 2020-04-02 MED ORDER — HYDROCHLOROTHIAZIDE 25 MG PO TABS
25.0000 mg | ORAL_TABLET | Freq: Every day | ORAL | Status: DC
  Administered 2020-04-02: 25 mg via ORAL
  Filled 2020-04-02: qty 1

## 2020-04-02 MED ORDER — ONDANSETRON HCL 4 MG/2ML IJ SOLN
INTRAMUSCULAR | Status: AC
  Filled 2020-04-02: qty 2

## 2020-04-02 MED ORDER — ASPIRIN 81 MG PO CHEW
81.0000 mg | CHEWABLE_TABLET | Freq: Two times a day (BID) | ORAL | Status: DC
  Administered 2020-04-02 – 2020-04-03 (×2): 81 mg via ORAL
  Filled 2020-04-02 (×2): qty 1

## 2020-04-02 MED ORDER — ACETAMINOPHEN 500 MG PO TABS: 1000.0000 mg | ORAL_TABLET | Freq: Once | ORAL | Status: AC

## 2020-04-02 MED ORDER — FENTANYL CITRATE (PF) 100 MCG/2ML IJ SOLN
25.0000 ug | INTRAMUSCULAR | Status: DC | PRN
  Administered 2020-04-02 (×2): 50 ug via INTRAVENOUS

## 2020-04-02 MED ORDER — FENTANYL CITRATE (PF) 100 MCG/2ML IJ SOLN
INTRAMUSCULAR | Status: AC
  Filled 2020-04-02: qty 2

## 2020-04-02 MED ORDER — DIPHENHYDRAMINE HCL 50 MG/ML IJ SOLN
INTRAMUSCULAR | Status: DC | PRN
  Administered 2020-04-02: 12.5 mg via INTRAVENOUS

## 2020-04-02 MED ORDER — BUPIVACAINE LIPOSOME 1.3 % IJ SUSP
INTRAMUSCULAR | Status: DC | PRN
  Administered 2020-04-02: 20 mL

## 2020-04-02 MED ORDER — MAGNESIUM CITRATE PO SOLN: 1.0000 | Freq: Once | ORAL | Status: DC | PRN

## 2020-04-02 MED ORDER — METOCLOPRAMIDE HCL 5 MG/ML IJ SOLN: 5.0000 mg | Freq: Three times a day (TID) | INTRAMUSCULAR | Status: DC | PRN

## 2020-04-02 MED ORDER — ACETAMINOPHEN 325 MG PO TABS: 325.0000 mg | ORAL_TABLET | Freq: Four times a day (QID) | ORAL | Status: DC | PRN

## 2020-04-02 MED ORDER — PROPOFOL 500 MG/50ML IV EMUL
INTRAVENOUS | Status: DC | PRN
  Administered 2020-04-02: 75 ug/kg/min via INTRAVENOUS

## 2020-04-02 MED ORDER — METHOCARBAMOL 1000 MG/10ML IJ SOLN
500.0000 mg | Freq: Four times a day (QID) | INTRAVENOUS | Status: DC | PRN
  Filled 2020-04-02: qty 5

## 2020-04-02 MED ORDER — GABAPENTIN 300 MG PO CAPS
300.0000 mg | ORAL_CAPSULE | Freq: Three times a day (TID) | ORAL | Status: DC
  Administered 2020-04-02 – 2020-04-03 (×3): 300 mg via ORAL
  Filled 2020-04-02 (×3): qty 1

## 2020-04-02 MED ORDER — DOCUSATE SODIUM 100 MG PO CAPS
100.0000 mg | ORAL_CAPSULE | Freq: Two times a day (BID) | ORAL | Status: DC
  Administered 2020-04-03: 100 mg via ORAL
  Filled 2020-04-02 (×2): qty 1

## 2020-04-02 MED ORDER — METOCLOPRAMIDE HCL 5 MG PO TABS: 5.0000 mg | ORAL_TABLET | Freq: Three times a day (TID) | ORAL | Status: DC | PRN

## 2020-04-02 MED ORDER — EPHEDRINE SULFATE-NACL 50-0.9 MG/10ML-% IV SOSY
PREFILLED_SYRINGE | INTRAVENOUS | Status: DC | PRN
  Administered 2020-04-02 (×4): 5 mg via INTRAVENOUS

## 2020-04-02 MED ORDER — LOSARTAN POTASSIUM 50 MG PO TABS
100.0000 mg | ORAL_TABLET | Freq: Every day | ORAL | Status: DC
  Administered 2020-04-02: 15:00:00 100 mg via ORAL
  Filled 2020-04-02: qty 2

## 2020-04-02 MED ORDER — DEXAMETHASONE SODIUM PHOSPHATE 10 MG/ML IJ SOLN
10.0000 mg | Freq: Once | INTRAMUSCULAR | Status: AC
  Administered 2020-04-03: 10 mg via INTRAVENOUS

## 2020-04-02 MED ORDER — BUPIVACAINE HCL (PF) 0.25 % IJ SOLN
INTRAMUSCULAR | Status: AC
  Filled 2020-04-02: qty 20

## 2020-04-02 SURGICAL SUPPLY — 61 items
BAG DECANTER FOR FLEXI CONT (MISCELLANEOUS) ×3 IMPLANT
CELLS DAT CNTRL 66122 CELL SVR (MISCELLANEOUS) IMPLANT
CLOSURE STERI-STRIP 1/2X4 (GAUZE/BANDAGES/DRESSINGS) ×2
CLSR STERI-STRIP ANTIMIC 1/2X4 (GAUZE/BANDAGES/DRESSINGS) ×4 IMPLANT
COVER PERINEAL POST (MISCELLANEOUS) ×3 IMPLANT
COVER SURGICAL LIGHT HANDLE (MISCELLANEOUS) ×3 IMPLANT
COVER WAND RF STERILE (DRAPES) ×3 IMPLANT
CUP ACET PNNCL SECTR W/GRIP 56 (Hips) ×1 IMPLANT
DERMABOND ADVANCED (GAUZE/BANDAGES/DRESSINGS) ×2
DERMABOND ADVANCED .7 DNX12 (GAUZE/BANDAGES/DRESSINGS) ×1 IMPLANT
DRAPE C-ARM 42X72 X-RAY (DRAPES) ×3 IMPLANT
DRAPE POUCH INSTRU U-SHP 10X18 (DRAPES) ×3 IMPLANT
DRAPE STERI IOBAN 125X83 (DRAPES) ×3 IMPLANT
DRAPE U-SHAPE 47X51 STRL (DRAPES) ×6 IMPLANT
DRSG AQUACEL AG ADV 3.5X10 (GAUZE/BANDAGES/DRESSINGS) ×3 IMPLANT
DURAPREP 26ML APPLICATOR (WOUND CARE) ×6 IMPLANT
ELECT BLADE 4.0 EZ CLEAN MEGAD (MISCELLANEOUS) ×3
ELECT REM PT RETURN 9FT ADLT (ELECTROSURGICAL) ×3
ELECTRODE BLDE 4.0 EZ CLN MEGD (MISCELLANEOUS) ×1 IMPLANT
ELECTRODE REM PT RTRN 9FT ADLT (ELECTROSURGICAL) ×1 IMPLANT
GLOVE BIOGEL PI IND STRL 7.0 (GLOVE) ×1 IMPLANT
GLOVE BIOGEL PI INDICATOR 7.0 (GLOVE) ×2
GLOVE ECLIPSE 7.0 STRL STRAW (GLOVE) ×6 IMPLANT
GLOVE SKINSENSE NS SZ7.5 (GLOVE) ×2
GLOVE SKINSENSE STRL SZ7.5 (GLOVE) ×1 IMPLANT
GLOVE SURG SYN 7.5  E (GLOVE) ×8
GLOVE SURG SYN 7.5 E (GLOVE) ×4 IMPLANT
GOWN STRL REIN XL XLG (GOWN DISPOSABLE) ×3 IMPLANT
GOWN STRL REUS W/ TWL LRG LVL3 (GOWN DISPOSABLE) IMPLANT
GOWN STRL REUS W/ TWL XL LVL3 (GOWN DISPOSABLE) ×1 IMPLANT
GOWN STRL REUS W/TWL LRG LVL3 (GOWN DISPOSABLE)
GOWN STRL REUS W/TWL XL LVL3 (GOWN DISPOSABLE) ×2
HANDPIECE INTERPULSE COAX TIP (DISPOSABLE) ×2
HEAD CERAMIC 36 PLUS 8.5 12 14 (Hips) ×3 IMPLANT
HOOD PEEL AWAY FLYTE STAYCOOL (MISCELLANEOUS) ×6 IMPLANT
IV NS IRRIG 3000ML ARTHROMATIC (IV SOLUTION) ×3 IMPLANT
KIT BASIN OR (CUSTOM PROCEDURE TRAY) ×3 IMPLANT
MARKER SKIN DUAL TIP RULER LAB (MISCELLANEOUS) ×3 IMPLANT
NEEDLE SPNL 18GX3.5 QUINCKE PK (NEEDLE) ×3 IMPLANT
PACK TOTAL JOINT (CUSTOM PROCEDURE TRAY) ×3 IMPLANT
PACK UNIVERSAL I (CUSTOM PROCEDURE TRAY) ×3 IMPLANT
PINN SECTOR W/GRIP ACE CUP 56 (Hips) ×3 IMPLANT
PINNACLE ALTRX PLUS 4 N 36X56 (Hips) ×3 IMPLANT
RTRCTR WOUND ALEXIS 18CM MED (MISCELLANEOUS)
SAW OSC TIP CART 19.5X105X1.3 (SAW) ×3 IMPLANT
SCREW 6.5MMX25MM (Screw) ×3 IMPLANT
SET HNDPC FAN SPRY TIP SCT (DISPOSABLE) ×1 IMPLANT
STAPLER VISISTAT 35W (STAPLE) IMPLANT
STEM FEMORAL SZ6 HIGH ACTIS (Stem) ×3 IMPLANT
SUT ETHIBOND 2 V 37 (SUTURE) ×3 IMPLANT
SUT ETHILON 2 0 PSLX (SUTURE) ×6 IMPLANT
SUT VIC AB 0 CT1 27 (SUTURE) ×2
SUT VIC AB 0 CT1 27XBRD ANBCTR (SUTURE) ×1 IMPLANT
SUT VIC AB 1 CTX 36 (SUTURE) ×2
SUT VIC AB 1 CTX36XBRD ANBCTR (SUTURE) ×1 IMPLANT
SUT VIC AB 2-0 CT1 27 (SUTURE) ×4
SUT VIC AB 2-0 CT1 TAPERPNT 27 (SUTURE) ×2 IMPLANT
SYR 50ML LL SCALE MARK (SYRINGE) ×3 IMPLANT
TOWEL GREEN STERILE (TOWEL DISPOSABLE) ×3 IMPLANT
TRAY CATH 16FR W/PLASTIC CATH (SET/KITS/TRAYS/PACK) ×3 IMPLANT
YANKAUER SUCT BULB TIP NO VENT (SUCTIONS) ×3 IMPLANT

## 2020-04-02 NOTE — Anesthesia Procedure Notes (Signed)
Spinal  Patient location during procedure: OR Start time: 04/02/2020 9:48 AM End time: 04/02/2020 9:52 AM Staffing Performed: anesthesiologist  Anesthesiologist: Cecile Hearing, MD Preanesthetic Checklist Completed: patient identified, IV checked, risks and benefits discussed, surgical consent, monitors and equipment checked, pre-op evaluation and timeout performed Spinal Block Patient position: sitting Prep: DuraPrep and site prepped and draped Patient monitoring: continuous pulse ox and blood pressure Approach: midline Location: L3-4 Injection technique: single-shot Needle Needle type: Pencan  Needle gauge: 24 G Additional Notes Functioning IV was confirmed and monitors were applied. Sterile prep and drape, including hand hygiene, mask and sterile gloves were used. The patient was positioned and the spine was prepped. The skin was anesthetized with lidocaine.  Free flow of clear CSF was obtained prior to injecting local anesthetic into the CSF.  The spinal needle aspirated freely following injection.  The needle was carefully withdrawn.  The patient tolerated the procedure well. Consent was obtained prior to procedure with all questions answered and concerns addressed. Risks including but not limited to bleeding, infection, nerve damage, paralysis, failed block, inadequate analgesia, allergic reaction, high spinal, itching and headache were discussed and the patient wished to proceed.   Arrie Aran, MD

## 2020-04-02 NOTE — H&P (Signed)
PREOPERATIVE H&P  Chief Complaint: right hip avascular necrosis  HPI: Chad Bautista is a 44 y.o. male who presents for surgical treatment of right hip avascular necrosis.  He denies any changes in medical history.  Past Medical History:  Diagnosis Date  . Diabetes mellitus without complication (HCC)   . Head injury with loss of consciousness (HCC)   . Hypertension    Past Surgical History:  Procedure Laterality Date  . ANKLE FRACTURE SURGERY    . APPENDECTOMY    . NASAL SEPTUM SURGERY    . TONSILLECTOMY     Social History   Socioeconomic History  . Marital status: Single    Spouse name: Not on file  . Number of children: Not on file  . Years of education: Not on file  . Highest education level: Not on file  Occupational History  . Not on file  Tobacco Use  . Smoking status: Current Every Day Smoker    Packs/day: 0.50    Years: 30.00    Pack years: 15.00  . Smokeless tobacco: Never Used  Substance and Sexual Activity  . Alcohol use: Not Currently    Comment: rare- beer  . Drug use: Not Currently    Types: Cocaine    Comment: hx of cocaine use, states that he has been clean five years  . Sexual activity: Not Currently  Other Topics Concern  . Not on file  Social History Narrative  . Not on file   Social Determinants of Health   Financial Resource Strain:   . Difficulty of Paying Living Expenses:   Food Insecurity:   . Worried About Programme researcher, broadcasting/film/video in the Last Year:   . Barista in the Last Year:   Transportation Needs:   . Freight forwarder (Medical):   Marland Kitchen Lack of Transportation (Non-Medical):   Physical Activity:   . Days of Exercise per Week:   . Minutes of Exercise per Session:   Stress:   . Feeling of Stress :   Social Connections:   . Frequency of Communication with Friends and Family:   . Frequency of Social Gatherings with Friends and Family:   . Attends Religious Services:   . Active Member of Clubs or Organizations:   .  Attends Banker Meetings:   Marland Kitchen Marital Status:    Family History  Problem Relation Age of Onset  . Heart disease Mother    Allergies  Allergen Reactions  . Tramadol     Seizure   Prior to Admission medications   Medication Sig Start Date End Date Taking? Authorizing Provider  buprenorphine (SUBUTEX) 8 MG SUBL SL tablet Place 8 mg under the tongue daily.   Yes [provider]  ibuprofen (ADVIL) 200 MG tablet Take 300-400 mg by mouth every 6 (six) hours as needed for moderate pain.   Yes [provider]  losartan-hydrochlorothiazide (HYZAAR) 100-25 MG tablet Take 1 tablet by mouth daily. 02/17/20  Yes Storm Frisk, MD  sertraline (ZOLOFT) 50 MG tablet Take 1 tablet (50 mg total) by mouth daily. Patient not taking: Reported on 03/27/2020 01/18/20   Storm Frisk, MD  Vitamin D, Ergocalciferol, (DRISDOL) 1.25 MG (50000 UNIT) CAPS capsule Take 1 capsule (50,000 Units total) by mouth every 7 (seven) days. Patient not taking: Reported on 03/27/2020 01/19/20   Storm Frisk, MD     Positive ROS: All other systems have been reviewed and were otherwise negative with the exception  of those mentioned in the HPI and as above.  Physical Exam: General: Alert, no acute distress Cardiovascular: No pedal edema Respiratory: No cyanosis, no use of accessory musculature GI: abdomen soft Skin: No lesions in the area of chief complaint Neurologic: Sensation intact distally Psychiatric: Patient is competent for consent with normal mood and affect Lymphatic: no lymphedema  MUSCULOSKELETAL: exam stable  Assessment: right hip avascular necrosis  Plan: Plan for Procedure(s): RIGHT TOTAL HIP ARTHROPLASTY ANTERIOR APPROACH  The risks benefits and alternatives were discussed with the patient including but not limited to the risks of nonoperative treatment, versus surgical intervention including infection, bleeding, nerve injury,  blood clots, cardiopulmonary  complications, morbidity, mortality, among others, and they were willing to proceed.   Preoperative templating of the joint replacement has been completed, documented, and submitted to the Operating Room personnel in order to optimize intra-operative equipment management.   Eduard Roux, MD   04/02/2020 6:15 AM

## 2020-04-02 NOTE — Anesthesia Postprocedure Evaluation (Signed)
Anesthesia Post Note  Patient: Chad Bautista  Procedure(s) Performed: RIGHT TOTAL HIP ARTHROPLASTY ANTERIOR APPROACH (Right Hip)     Patient location during evaluation: PACU Anesthesia Type: Spinal Level of consciousness: oriented, awake and alert and awake Pain management: pain level controlled Vital Signs Assessment: post-procedure vital signs reviewed and stable Respiratory status: spontaneous breathing, respiratory function stable and nonlabored ventilation Cardiovascular status: blood pressure returned to baseline and stable Postop Assessment: no headache, no backache, no apparent nausea or vomiting, patient able to bend at knees and spinal receding Anesthetic complications: no    Last Vitals:  Vitals:   04/02/20 1651 04/02/20 1913  BP: 137/74 123/70  Pulse: 92 (!) 102  Resp: 18 20  Temp: 36.8 C 36.9 C  SpO2: 99% 98%    Last Pain:  Vitals:   04/02/20 2129  TempSrc:   PainSc: 5                  Cecile Hearing

## 2020-04-02 NOTE — Op Note (Signed)
RIGHT TOTAL HIP ARTHROPLASTY ANTERIOR APPROACH  Procedure Note Hendryx Ricke   [id number]  Pre-op Diagnosis: right hip avascular necrosis     Post-op Diagnosis: same   Operative Procedures  1. Total hip replacement; Right hip; uncemented [id number]   Personnel  Surgeon(s): Tarry Kos, MD  Assist: Oneal Grout, PA-C; necessary for the timely completion of procedure and due to complexity of procedure.   Anesthesia: spinal  Prosthesis: Depuy Acetabulum: Pinnacle 56 mm Femur: Actis 6 HO Head: 36 mm size: +8.5 Liner: +4 neutral Bearing Type: ceramic on poly  Total Hip Arthroplasty (Anterior Approach) Op Note:  After informed consent was obtained and the operative extremity marked in the holding area, the patient was brought back to the operating room and placed supine on the HANA table. Next, the operative extremity was prepped and draped in normal sterile fashion. Surgical timeout occurred verifying patient identification, surgical site, surgical procedure and administration of antibiotics.  A modified anterior Smith-Peterson approach to the hip was performed, using the interval between tensor fascia lata and sartorius.  Dissection was carried bluntly down onto the anterior hip capsule. The lateral femoral circumflex vessels were identified and coagulated. A capsulotomy was performed and the capsular flaps tagged for later repair.  The neck osteotomy was performed. The femoral head was removed which showed severe collapse consistent with AVN, the acetabular rim was cleared of soft tissue and attention was turned to reaming the acetabulum.  Sequential reaming was performed under fluoroscopic guidance. We reamed to a size 55 mm, and then impacted the acetabular shell and placed a 25 mm cancellous screw. The liner was then placed after irrigation and attention turned to the femur.  After placing the femoral hook, the leg was taken to externally rotated, extended and adducted  position taking care to perform soft tissue releases to allow for adequate mobilization of the femur. Soft tissue was cleared from the shoulder of the greater trochanter and the hook elevator used to improve exposure of the proximal femur. Sequential broaching performed up to a size 6. Trial neck and head were placed. The leg was brought back up to neutral and the construct reduced. The position and sizing of components, offset and leg lengths were checked using fluoroscopy. Stability of the construct was checked in extension and external rotation without any subluxation or impingement of prosthesis. We dislocated the prosthesis, dropped the leg back into position, removed trial components, and irrigated copiously. The final stem and head was then placed, the leg brought back up, the system reduced and fluoroscopy used to verify positioning.  We irrigated, obtained hemostasis and closed the capsule using #2 ethibond suture.  One gram of vancomycin powder was placed in the surgical bed. The fascia was closed with #1 vicryl plus, the deep fat layer was closed with 0 vicryl, the subcutaneous layers closed with 2.0 Vicryl Plus and the skin closed with 2.0 nylon and dermabond. A sterile dressing was applied. The patient was awakened in the operating room and taken to recovery in stable condition.  All sponge, needle, and instrument counts were correct at the end of the case.   Position: supine  Complications: see description of procedure.  Time Out: performed   Drains/Packing: none  Estimated blood loss: see anesthesia record  Returned to Recovery Room: in good condition.   Antibiotics: yes   Mechanical VTE (DVT) Prophylaxis: sequential compression devices, TED thigh-high  Chemical VTE (DVT) Prophylaxis: aspirin   Fluid Replacement: see anesthesia record  Specimens Removed:  1 to pathology   Sponge and Instrument Count Correct? yes   PACU: portable radiograph - low AP   Plan/RTC: Return in 2  weeks for staple removal. Weight Bearing/Load Lower Extremity: full  Hip precautions: none Suture Removal: 2 weeks   N. Eduard Roux, MD [facility] 11:23 AM   Implant Name Type Inv. Item Serial No. Manufacturer Lot No. LRB No. Used Action  PINN SECTOR W/GRIP ACE CUP 56 - [id number] Hips PINN SECTOR W/GRIP ACE CUP 56  DEPU[employer] SYN[employer]ES [id number] Right 1 Implanted  PINNACLE ALTRX PLUS 4 N 36X56 - [id number] Hips PINNACLE ALTRX PLUS 4 N 36X56  DEPUY SYNTHES [id number] Right 1 Implanted  SCREW 6.5MMX25MM - [id number] Screw SCREW 6.5MMX25MM  DEPUY SYNTHES [id number] Right 1 Implanted  STEM FEMORAL SZ6 HIGH ACTIS - [id number] Stem STEM FEMORAL SZ6 HIGH ACTIS  [employer] DEPUY SPINE [id number] Right 1 Implanted  HEAD CERAMIC 36 PLUS 8.5 12 14  - [id number] Hips HEAD CERAMIC 36 PLUS 8.5 12 14   DEPUY SYNTHES [id number] Right 1 Implanted

## 2020-04-02 NOTE — Evaluation (Signed)
Physical Therapy Evaluation Patient Details Name: Chad Bautista MRN: [id number] DOB: 02-Oct-1976 Today's Date: 04/02/2020   History of Present Illness  Pt is a 44 y/o male s/p R THA, direct anterior. PMH includes DM, HTN, smoker, and substance abuse.   Clinical Impression  Pt is s/p surgery above with deficits below. Pt requiring min guard to supervision for mobility tasks using RW. Impulsive and required cues to wait for PT to perform mobility tasks.Will continue to follow acutely to maximize functional mobility independence and safety.     Follow Up Recommendations Follow surgeon's recommendation for DC plan and follow-up therapies;Supervision for mobility/OOB(would benefit from outpatient PT if eligible)    Equipment Recommendations  Rolling walker with 5" wheels;3in1 (PT)    Recommendations for Other Services       Precautions / Restrictions Precautions Precautions: None Restrictions Weight Bearing Restrictions: No      Mobility  Bed Mobility Overal bed mobility: Needs Assistance Bed Mobility: Supine to Sit;Sit to Supine     Supine to sit: Supervision Sit to supine: Supervision   General bed mobility comments: Supervision for safety.   Transfers Overall transfer level: Needs assistance Equipment used: Rolling walker (2 wheeled) Transfers: Sit to/from Stand Sit to Stand: Min guard         General transfer comment: Min guard for safety. Cues to wait for PT to perform mobility tasks as pt impulsive.   Ambulation/Gait Ambulation/Gait assistance: Min guard Gait Distance (Feet): 200 Feet Assistive device: Rolling walker (2 wheeled) Gait Pattern/deviations: Step-through pattern;Decreased step length - right;Decreased step length - left;Antalgic Gait velocity: Decreased   General Gait Details: Mildly antalgic gait, however, tolerated ambulation well. Cues for sequencing using RW.   Stairs            Wheelchair Mobility    Modified Rankin (Stroke  Patients Only)       Balance Overall balance assessment: Needs assistance Sitting-balance support: Feet supported;No upper extremity supported Sitting balance-Leahy Scale: Good     Standing balance support: Bilateral upper extremity supported;During functional activity;No upper extremity supported Standing balance-Leahy Scale: Fair Standing balance comment: Able to maintain static standing without UE support                              Pertinent Vitals/Pain Pain Assessment: 0-10 Pain Score: 8  Pain Location: R hip  Pain Descriptors / Indicators: Aching;Operative site guarding Pain Intervention(s): Limited activity within patient's tolerance;Monitored during session;Repositioned    Home Living Family/patient expects to be discharged to:: Private residence Living Arrangements: Alone Available Help at Discharge: Family;Friend(s) Type of Home: House Home Access: Stairs to enter Entrance Stairs-Rails: Right Entrance Stairs-Number of Steps: 4-5 Home Layout: One level Home Equipment: Crutches      Prior Function Level of Independence: Independent with assistive device(s)         Comments: Was using crutches prior to admission      Hand Dominance        Extremity/Trunk Assessment   Upper Extremity Assessment Upper Extremity Assessment: Overall WFL for tasks assessed    Lower Extremity Assessment Lower Extremity Assessment: RLE deficits/detail RLE Deficits / Details: deficits consistent with post op pain and weakness.     Cervical / Trunk Assessment Cervical / Trunk Assessment: Normal  Communication   Communication: No difficulties  Cognition Arousal/Alertness: Awake/alert Behavior During Therapy: Impulsive Overall Cognitive Status: Within Functional Limits for tasks assessed  General Comments: Pt impulsive and requiring cues to wait for PT to perform mobility tasks.       General Comments       Exercises Total Joint Exercises Ankle Circles/Pumps: AROM;Both;20 reps   Assessment/Plan    PT Assessment Patient needs continued PT services  PT Problem List Decreased strength;Decreased balance;Decreased activity tolerance;Decreased mobility;Pain       PT Treatment Interventions DME instruction;Gait training;Functional mobility training;Stair training;Therapeutic activities;Balance training;Therapeutic exercise;Patient/family education    PT Goals (Current goals can be found in the Care Plan section)  Acute Rehab PT Goals Patient Stated Goal: to go home PT Goal Formulation: With patient Time For Goal Achievement: 04/16/20 Potential to Achieve Goals: Good    Frequency 7X/week   Barriers to discharge Decreased caregiver support      Co-evaluation               AM-PAC PT "6 Clicks" Mobility  Outcome Measure Help needed turning from your back to your side while in a flat bed without using bedrails?: None Help needed moving from lying on your back to sitting on the side of a flat bed without using bedrails?: None Help needed moving to and from a bed to a chair (including a wheelchair)?: A Little Help needed standing up from a chair using your arms (e.g., wheelchair or bedside chair)?: A Little Help needed to walk in hospital room?: A Little Help needed climbing 3-5 steps with a railing? : A Little 6 Click Score: 20    End of Session   Activity Tolerance: Patient tolerated treatment well Patient left: in bed;with call bell/phone within reach;with nursing/sitter in room(refusing to sit in chair) Nurse Communication: Mobility status PT Visit Diagnosis: Other abnormalities of gait and mobility (R26.89);Pain Pain - Right/Left: Right Pain - part of body: Hip    Time: 9518-8416 PT Time Calculation (min) (ACUTE ONLY): 19 min   Charges:   PT Evaluation $PT Eval Low Complexity: 1 Low          Lou Miner, DPT  Acute Rehabilitation Services  Pager: [phone] Office: [phone]   Rudean Hitt 04/02/2020, 6:10 PM

## 2020-04-02 NOTE — Anesthesia Procedure Notes (Signed)
Procedure Name: MAC Date/Time: 04/02/2020 10:05 AM Performed by: Lowella Dell, CRNA Pre-anesthesia Checklist: Patient identified, Emergency Drugs available, Suction available, Patient being monitored and Timeout performed Patient Re-evaluated:Patient Re-evaluated prior to induction Oxygen Delivery Method: Simple face mask Airway Equipment and Method: Oral airway Placement Confirmation: positive ETCO2 Dental Injury: Teeth and Oropharynx as per pre-operative assessment

## 2020-04-02 NOTE — Transfer of Care (Signed)
Immediate Anesthesia Transfer of Care Note  Patient: Chad Bautista  Procedure(s) Performed: RIGHT TOTAL HIP ARTHROPLASTY ANTERIOR APPROACH (Right Hip)  Patient Location: PACU  Anesthesia Type:MAC  Level of Consciousness: awake and patient cooperative  Airway & Oxygen Therapy: Patient Spontanous Breathing  Post-op Assessment: Report given to RN and Post -op Vital signs reviewed and stable  Post vital signs: Reviewed and stable  Last Vitals:  Vitals Value Taken Time  BP 90/65 04/02/20 1157  Temp    Pulse 82 04/02/20 1158  Resp 11 04/02/20 1158  SpO2 99 % 04/02/20 1158  Vitals shown include unvalidated device data.  Last Pain:  Vitals:   04/02/20 0736  TempSrc:   PainSc: 6          Complications: No apparent anesthesia complications

## 2020-04-03 ENCOUNTER — Encounter: Payer: Self-pay | Admitting: *Deleted

## 2020-04-03 LAB — CBC
HCT: 32.2 % — ABNORMAL LOW (ref 39.0–52.0)
Hemoglobin: 10.5 g/dL — ABNORMAL LOW (ref 13.0–17.0)
MCH: 30.3 pg (ref 26.0–34.0)
MCHC: 32.6 g/dL (ref 30.0–36.0)
MCV: 92.8 fL (ref 80.0–100.0)
Platelets: 202 10*3/uL (ref 150–400)
RBC: 3.47 MIL/uL — ABNORMAL LOW (ref 4.22–5.81)
RDW: 12 % (ref 11.5–15.5)
WBC: 6 10*3/uL (ref 4.0–10.5)
nRBC: 0 % (ref 0.0–0.2)

## 2020-04-03 LAB — BASIC METABOLIC PANEL
Anion gap: 7 (ref 5–15)
BUN: 12 mg/dL (ref 6–20)
CO2: 28 mmol/L (ref 22–32)
Calcium: 8.5 mg/dL — ABNORMAL LOW (ref 8.9–10.3)
Chloride: 101 mmol/L (ref 98–111)
Creatinine, Ser: 1.15 mg/dL (ref 0.61–1.24)
GFR calc Af Amer: >60 mL/min (ref >60)
GFR calc non Af Amer: >60 mL/min (ref >60)
Glucose, Bld: 152 mg/dL — ABNORMAL HIGH (ref 70–99)
Potassium: 5.3 mmol/L — ABNORMAL HIGH (ref 3.5–5.1)
Sodium: 136 mmol/L (ref 135–145)

## 2020-04-03 MED ORDER — METHOCARBAMOL 750 MG PO TABS: 750.0000 mg | ORAL_TABLET | Freq: Four times a day (QID) | ORAL | 0 refills | Status: DC | PRN

## 2020-04-03 MED ORDER — OXYCODONE HCL ER 10 MG PO T12A: 10.0000 mg | EXTENDED_RELEASE_TABLET | Freq: Two times a day (BID) | ORAL | 0 refills | Status: DC

## 2020-04-03 MED ORDER — DOCUSATE SODIUM 100 MG PO CAPS: 100.0000 mg | ORAL_CAPSULE | Freq: Two times a day (BID) | ORAL | 0 refills | Status: DC

## 2020-04-03 MED ORDER — ASPIRIN 81 MG PO CHEW: 81.0000 mg | CHEWABLE_TABLET | Freq: Two times a day (BID) | ORAL | 0 refills | Status: DC

## 2020-04-03 MED ORDER — ONDANSETRON HCL 4 MG PO TABS: 4.0000 mg | ORAL_TABLET | Freq: Four times a day (QID) | ORAL | 0 refills | Status: DC | PRN

## 2020-04-03 MED ORDER — OXYCODONE HCL 5 MG PO TABS: 5.0000 mg | ORAL_TABLET | Freq: Four times a day (QID) | ORAL | 0 refills | Status: DC | PRN

## 2020-04-03 MED FILL — METHOCARBAMOL 750 MG TABS: 750 | 8 days supply | Qty: 30 | Fill #0

## 2020-04-03 MED FILL — ASPIRIN LOW DOSE 81 MG CHEW: 81 | 42 days supply | Qty: 84 | Fill #0

## 2020-04-03 MED FILL — ONDANSETRON HCL 4 MG TABLET: 4 | 5 days supply | Qty: 20 | Fill #0

## 2020-04-03 MED FILL — DOK 100 MG CAPS: 100 | 5 days supply | Qty: 10 | Fill #0

## 2020-04-03 MED FILL — OxyCONTIN 10 MG T12A: 10 | 3 days supply | Qty: 6 | Fill #0

## 2020-04-03 MED FILL — oxyCODONE HCL 5 MG TABS: 5 | 4 days supply | Qty: 30 | Fill #0

## 2020-04-03 NOTE — Progress Notes (Signed)
Subjective: 1 Day Post-Op Procedure(s) (LRB): RIGHT TOTAL HIP ARTHROPLASTY ANTERIOR APPROACH (Right) Patient reports pain as mild.    Objective: Vital signs in last 24 hours: Temp:  [97 F (36.1 C)-98.5 F (36.9 C)] 98.2 F (36.8 C) (04/20 0806) Pulse Rate:  [67-102] 89 (04/20 0806) Resp:  [17-27] 18 (04/20 0806) BP: (90-137)/(64-85) 111/68 (04/20 0806) SpO2:  [96 %-100 %] 97 % (04/20 0806)  Intake/Output from previous day: 04/19 0701 - 04/20 0700 In: 1640 [P.O.:240; I.V.:1200; IV Piggyback:200] Out: 2900 [Urine:2400; Blood:500] Intake/Output this shift: No intake/output data recorded.  Recent Labs    04/03/20 0539  HGB 10.5*   Recent Labs    04/03/20 0539  WBC 6.0  RBC 3.47*  HCT 32.2*  PLT 202   Recent Labs    04/03/20 0539  NA 136  K 5.3*  CL 101  CO2 28  BUN 12  CREATININE 1.15  GLUCOSE 152*  CALCIUM 8.5*   No results for input(s): LABPT, INR in the last 72 hours.  Neurologically intact Neurovascular intact Sensation intact distally Intact pulses distally Dorsiflexion/Plantar flexion intact Incision: dressing C/D/I No cellulitis present Compartment soft   Assessment/Plan: 1 Day Post-Op Procedure(s) (LRB): RIGHT TOTAL HIP ARTHROPLASTY ANTERIOR APPROACH (Right) Advance diet Up with therapy D/C IV fluids D/c home today after first PT session WBAT RLE ABLA- mild and stable     Cristie Hem 04/03/2020, 8:25 AM

## 2020-04-03 NOTE — Discharge Instructions (Signed)

## 2020-04-03 NOTE — Progress Notes (Signed)
MATCH form completed and sent to pharmacy. Patient is listed with no insurance so charity with [facility] has been notified for [facility] PT. Awaiting approval. Will follow up with patient.

## 2020-04-03 NOTE — Progress Notes (Signed)
Physical Therapy Treatment Patient Details Name: Chad Bautista MRN: [id number] DOB: [birth year] Today's Date: 04/03/2020    History of Present Illness Pt is a 44 y/o male s/p R THA, direct anterior. PMH includes DM, HTN, smoker, and substance abuse.     PT Comments    Patient progressing well with mobility. Tolerated bed mobility, transfers and gait training with supervision-Mod I. Initially using crutches for ambulation but transitioned to no DME (pt request) for the walk back to the room. Encouraged use of crutches in order to establish a more normalized gait pattern. Tolerated stair training without difficulty. Education re: importance of ice, elevation and mobility. Pt does not require skilled therapy services. 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 All education completed. Discharge from therapy.   Follow Up Recommendations  Follow surgeon's recommendation for DC plan and follow-up therapies;Supervision - Intermittent(would benefit from OPPT)     Equipment Recommendations  None recommended by PT    Recommendations for Other Services       Precautions / Restrictions Precautions Precautions: None Restrictions Weight Bearing Restrictions: No    Mobility  Bed Mobility Overal bed mobility: Needs Assistance Bed Mobility: Rolling;Sidelying to Sit Rolling: Modified independent (Device/Increase time) Sidelying to sit: Modified independent (Device/Increase time);HOB elevated   Sit to supine: Modified independent (Device/Increase time);HOB elevated   General bed mobility comments: No assist needed. Use of rail to get to EOB.  Transfers Overall transfer level: Needs assistance Equipment used: None Transfers: Sit to/from Stand Sit to Stand: Supervision         General transfer comment: Supervision for safety progressing to Mod I.  Ambulation/Gait Ambulation/Gait assistance: Supervision;Modified independent  (Device/Increase time) Gait Distance (Feet): 500 Feet Assistive device: Crutches;None Gait Pattern/deviations: Step-through pattern;Decreased step length - right;Decreased step length - left;Antalgic Gait velocity: Decreased Gait velocity interpretation: 1.31 - 2.62 ft/sec, indicative of limited community ambulator General Gait Details: Mildly antalgic gait with crutches, improved with cues. Walked halfway with crutches and halfway without DME.   Stairs Stairs: Yes Stairs assistance: Supervision Stair Management: One rail Right;Step to pattern Number of Stairs: 4 General stair comments: Cues for technique and safety.   Wheelchair Mobility    Modified Rankin (Stroke Patients Only)       Balance Overall balance assessment: Needs assistance Sitting-balance support: No upper extremity supported;Feet supported Sitting balance-Leahy Scale: Good     Standing balance support: During functional activity Standing balance-Leahy Scale: Fair                              Cognition Arousal/Alertness: Awake/alert Behavior During Therapy: Impulsive Overall Cognitive Status: Within Functional Limits for tasks assessed                                 General Comments: Impulsive.      Exercises      General Comments General comments (skin integrity, edema, etc.): Education on importance of use of ice pack to help with swelling especially after exercise/activity.      Pertinent Vitals/Pain Pain Assessment: Faces Faces Pain Scale: Hurts a little bit Pain Location: Rt hip Pain Descriptors / Indicators: Aching;Operative site guarding Pain Intervention(s): Repositioned;Monitored during session    Home Living  Prior Function            PT Goals (current goals can now be found in the care plan section) Progress towards PT goals: Goals met/education completed, patient discharged from PT    Frequency    7X/week       PT Plan Current plan remains appropriate    Co-evaluation              AM-PAC PT "6 Clicks" Mobility   Outcome Measure  Help needed turning from your back to your side while in a flat bed without using bedrails?: None Help needed moving from lying on your back to sitting on the side of a flat bed without using bedrails?: None Help needed moving to and from a bed to a chair (including a wheelchair)?: None Help needed standing up from a chair using your arms (e.g., wheelchair or bedside chair)?: None Help needed to walk in hospital room?: A Little Help needed climbing 3-5 steps with a railing? : A Little 6 Click Score: 22    End of Session   Activity Tolerance: Patient tolerated treatment well Patient left: in bed;with call bell/phone within reach Nurse Communication: Mobility status PT Visit Diagnosis: Other abnormalities of gait and mobility (R26.89);Pain Pain - Right/Left: Right Pain - part of body: Hip     Time: 1694-5038 PT Time Calculation (min) (ACUTE ONLY): 12 min  Charges:  $Gait Training: 8-22 mins                     Marisa Severin, PT, DPT Acute Rehabilitation Services Pager [phone] Office [place] 04/03/2020, 10:05 AM

## 2020-04-03 NOTE — Progress Notes (Addendum)
CM on unit to update patient and patient has already discharged. Bedside nurse reported that patient insisted on leaving. Spoke with Lupita Leash from [facility] and patient has been approved for charity. [facility] will contact patient to set up [facility] services. Bedside nurse reports that meds were delivered per [facility] pharmacy. CM will sign off.

## 2020-04-03 NOTE — Discharge Summary (Signed)
Patient ID: Chad Bautista MRN: [id number] DOB/AGE: July 15, 1976 44 y.o.  Admit date: 04/02/2020 Discharge date: 04/03/2020  Admission Diagnoses:  Principal Problem:   Avascular necrosis of bone of right hip [facility]) Active Problems:   Status post total replacement of right hip   Discharge Diagnoses:  Same  Past Medical History:  Diagnosis Date  . Diabetes mellitus without complication (HCC)   . Head injury with loss of consciousness (HCC)   . Hypertension     Surgeries: Procedure(s): RIGHT TOTAL HIP ARTHROPLASTY ANTERIOR APPROACH on 04/02/2020   Consultants:   Discharged Condition: Improved  Hospital Course: Chad Bautista is an 44 y.o. male who was admitted 04/02/2020 for operative treatment ofAvascular necrosis of bone of right hip (HCC). Patient has severe unremitting pain that affects sleep, daily activities, and work/hobbies. After pre-op clearance the patient was taken to the operating room on 04/02/2020 and underwent  Procedure(s): RIGHT TOTAL HIP ARTHROPLASTY ANTERIOR APPROACH.    Patient was given perioperative antibiotics:  Anti-infectives (From admission, onward)   Start     Dose/Rate Route Frequency Ordered Stop   04/02/20 1600  ceFAZolin (ANCEF) IVPB 2g/100 mL premix     2 g 200 mL/hr over 30 Minutes Intravenous Every 6 hours 04/02/20 1257 04/03/20 0416   04/02/20 1034  vancomycin (VANCOCIN) powder  Status:  Discontinued       As needed 04/02/20 1034 04/02/20 1152   04/02/20 0730  ceFAZolin (ANCEF) IVPB 2g/100 mL premix     2 g 200 mL/hr over 30 Minutes Intravenous On call to O.R. 04/02/20 0716 04/02/20 1000   04/02/20 0723  ceFAZolin (ANCEF) 2-4 GM/100ML-% IVPB    Note to Pharmacy: Pauletta Browns   : cabinet override      04/02/20 0723 04/02/20 0959       Patient was given sequential compression devices, early ambulation, and chemoprophylaxis to prevent DVT.  Patient benefited maximally from hospital stay and there were no complications.    Recent vital  signs:  Patient Vitals for the past 24 hrs:  BP Temp Temp src Pulse Resp SpO2  04/03/20 0806 111/68 98.2 F (36.8 C) Oral 89 18 97 %  04/03/20 0335 96/64 98.5 F (36.9 C) Oral 79 18 96 %  04/02/20 2348 109/74 97.7 F (36.5 C) Oral 83 20 97 %  04/02/20 1913 123/70 98.4 F (36.9 C) Oral (!) 102 20 98 %  04/02/20 1651 137/74 98.2 F (36.8 C) Oral 92 18 99 %  04/02/20 1305 122/76 97.6 F (36.4 C) Oral 67 19 100 %  04/02/20 1245 117/85 (!) 97.2 F (36.2 C) -- 69 17 100 %  04/02/20 1230 115/74 -- -- 77 (!) 27 99 %  04/02/20 1215 107/69 -- -- 74 20 99 %  04/02/20 1200 90/65 (!) 97 F (36.1 C) -- 89 17 100 %     Recent laboratory studies:  Recent Labs    04/03/20 0539  WBC 6.0  HGB 10.5*  HCT 32.2*  PLT 202  NA 136  K 5.3*  CL 101  CO2 28  BUN 12  CREATININE 1.15  GLUCOSE 152*  CALCIUM 8.5*     Discharge Medications:   Allergies as of 04/03/2020      Reactions   Tramadol    Seizure      Medication List    STOP taking these medications   buprenorphine 8 MG Subl SL tablet Commonly known as: SUBUTEX   ibuprofen 200 MG tablet Commonly known as: ADVIL   sertraline 50  MG tablet Commonly known as: ZOLOFT     TAKE these medications   aspirin 81 MG chewable tablet Chew 1 tablet (81 mg total) by mouth 2 (two) times daily.   docusate sodium 100 MG capsule Commonly known as: COLACE Take 1 capsule (100 mg total) by mouth 2 (two) times daily.   losartan-hydrochlorothiazide 100-25 MG tablet Commonly known as: HYZAAR Take 1 tablet by mouth daily.   methocarbamol 750 MG tablet Commonly known as: ROBAXIN Take 1 tablet (750 mg total) by mouth every 6 (six) hours as needed for muscle spasms.   ondansetron 4 MG tablet Commonly known as: ZOFRAN Take 1 tablet (4 mg total) by mouth every 6 (six) hours as needed for nausea.   oxyCODONE 5 MG immediate release tablet Commonly known as: Oxy IR/ROXICODONE Take 1-2 tablets (5-10 mg total) by mouth every 6 (six) hours as  needed for moderate pain (pain score 4-6).   oxyCODONE 10 mg 12 hr tablet Commonly known as: OXYCONTIN Take 1 tablet (10 mg total) by mouth every 12 (twelve) hours.   Vitamin D (Ergocalciferol) 1.25 MG (50000 UNIT) Caps capsule Commonly known as: DRISDOL Take 1 capsule (50,000 Units total) by mouth every 7 (seven) days.            Durable Medical Equipment  (From admission, onward)         Start     Ordered   04/02/20 1258  DME Walker rolling  Once    Question:  Patient needs a walker to treat with the following condition  Answer:  History of hip replacement   04/02/20 1257   04/02/20 1258  DME 3 n 1  Once     04/02/20 1257   04/02/20 1258  DME Bedside commode  Once    Question:  Patient needs a bedside commode to treat with the following condition  Answer:  History of hip replacement   04/02/20 1257          Diagnostic Studies: DG Chest 2 View  Result Date: 03/27/2020 CLINICAL DATA:  Total right hip replacement. EXAM: CHEST - 2 VIEW COMPARISON:  No prior. FINDINGS: Mediastinum and hilar structures normal. Lungs are clear. No pleural effusion or pneumothorax. No acute bony abnormality. IMPRESSION: No acute cardiopulmonary disease. Electronically Signed   By: Maisie Fus  Register   On: 03/27/2020 14:45   DG Pelvis Portable  Result Date: 04/02/2020 CLINICAL DATA:  Right hip avascular necrosis. EXAM: PORTABLE PELVIS 1-2 VIEWS COMPARISON:  None. FINDINGS: The right femoral and acetabular components are well situated. Expected postoperative changes are noted in the surrounding soft tissues. IMPRESSION: Status post right total hip arthroplasty. Electronically Signed   By: Lupita Raider M.D.   On: 04/02/2020 12:37   DG C-Arm 1-60 Min  Result Date: 04/02/2020 CLINICAL DATA:  Right total hip arthroplasty. EXAM: OPERATIVE RIGHT HIP (WITH PELVIS IF PERFORMED) 4 VIEWS TECHNIQUE: Fluoroscopic spot image(s) were submitted for interpretation post-operatively. COMPARISON:  Radiographs  02/22/2020 FINDINGS: Intraoperative fluoroscopy was provided. 24 seconds of fluoro time. Initial images demonstrate stable chronic flattening of the right femoral head and advanced secondary right hip osteoarthritic changes. Subsequent images demonstrate right total hip arthroplasty with a screw fixed acetabular component. No complications are identified. IMPRESSION: Intraoperative views during right total hip arthroplasty. No demonstrated complications. Electronically Signed   By: Carey Bullocks M.D.   On: 04/02/2020 11:40   DG HIP OPERATIVE UNILAT W OR W/O PELVIS RIGHT  Result Date: 04/02/2020 CLINICAL DATA:  Right total hip  arthroplasty. EXAM: OPERATIVE RIGHT HIP (WITH PELVIS IF PERFORMED) 4 VIEWS TECHNIQUE: Fluoroscopic spot image(s) were submitted for interpretation post-operatively. COMPARISON:  Radiographs 02/22/2020 FINDINGS: Intraoperative fluoroscopy was provided. 24 seconds of fluoro time. Initial images demonstrate stable chronic flattening of the right femoral head and advanced secondary right hip osteoarthritic changes. Subsequent images demonstrate right total hip arthroplasty with a screw fixed acetabular component. No complications are identified. IMPRESSION: Intraoperative views during right total hip arthroplasty. No demonstrated complications. Electronically Signed   By: Richardean Sale M.D.   On: 04/02/2020 11:40    Disposition: Discharge disposition: 01-Home or Self Care         Follow-up Information    Leandrew Koyanagi, MD. Schedule an appointment as soon as possible for a visit in 2 week(s).   Specialty: Orthopedic Surgery Contact information: [address] [place] [address] [phone]            Signed: Aundra Dubin 04/03/2020, 8:30 AM

## 2020-04-03 NOTE — Plan of Care (Signed)
Patient alert and oriented, mae's well, voiding adequate amount of urine, swallowing without difficulty, no c/o pain at time of discharge. Patient discharged home with family. Medication and discharged instructions given to patient. Patient stated understanding of instructions given. Patient has an appointment with Dr.Xu

## 2020-04-04 ENCOUNTER — Telehealth: Payer: Self-pay | Admitting: Orthopaedic Surgery

## 2020-04-04 ENCOUNTER — Telehealth: Payer: Self-pay

## 2020-04-04 NOTE — Telephone Encounter (Signed)
Called to approve orders 

## 2020-04-04 NOTE — Telephone Encounter (Signed)
Transition Care Management Follow-up Telephone Call  Date of discharge and from where: 04/03/2020, [facility]   How have you been since you were released from the hospital? He said he is sore and has some " burning" at his incision site.  The dressing remains intact and he said that there is no sign of leaking.  Per his AVS, he is not to change the dressing for 7 days. Instructed him to call ortho if he has concerns about his wound/surgery  Any questions or concerns?  only questions were regarding 3:1 commode and BP med - noted below Items Reviewed:  Did the pt receive and understand the discharge instructions provided? yes he has them and he said that he does not have any questions.   Medications obtained and verified?  he said that he has all medications and then noted that he does not have BP med.  He is not sure he would be able to get  to the outpatient pharmacy to pick up the medication and he is also concerned about the cost.  Explained that this CM will check with [facility] pharmacy to see if any resources are available for assistance with the medication.   Any new allergies since your discharge?  none reported  Do you have support at home? he lives alone but said that he has help when he needs it.   Other (ie: DME, Home Health, etc) he was referred to [facility] and said that the PT had already been out to see him.   He does not have the 3:1 commode but said they had mentioned something about it.   Functional Questionnaire: (I = Independent and D = Dependent) ADL's:independent.  Has been using his crutches.  Also has a wheelchair   Follow up appointments reviewed:    PCP Hospital f/u appt confirmed?appt with Dr Delford Field - 04/09/2020 @ 1030 @ [facility]  Specialist Hospital f/u appt confirmed? appt with Dr Roda Shutters 04/17/2020  Are transportation arrangements needed? he said he will need to work on arranging transportation.   If their condition worsens, is the pt aware to call   their PCP or go to the ED? yes  Was the patient provided with contact information for the PCP's office or ED?he has the phone number  Was the pt encouraged to call back with questions or concerns?  yes   Call placed to [facility] unit 3C # [phone] regarding 3:1 commode. Spoke to [place] who said that they were not able to provide him with the commode because he does not have insurance.  Jonetta Speak, RN CM was not aware of any other resources for the patient to obtain a 3:1 commode.  Called patient back to inform him of status of commode.  Message left with call back requested to this CM

## 2020-04-04 NOTE — Telephone Encounter (Signed)
Received call from [facility] (PT) with [facility] advised saw patient for (PT) and will need verbal orders for HHPT 2 Wk 3. The number to contact Amber is [phone]

## 2020-04-05 ENCOUNTER — Other Ambulatory Visit: Payer: Self-pay | Admitting: Critical Care Medicine

## 2020-04-05 ENCOUNTER — Telehealth: Payer: Self-pay

## 2020-04-05 MED ORDER — LOSARTAN POTASSIUM-HCTZ 100-25 MG PO TABS: 1.0000 | ORAL_TABLET | Freq: Every day | ORAL | 1 refills | Status: DC

## 2020-04-05 MED FILL — LOSARTAN-HCTZ 100-25 MG TAB: 100-25 | 30 days supply | Qty: 30 | Fill #0

## 2020-04-05 NOTE — Telephone Encounter (Signed)
Call received from patient. Explained to him that the hospital was not able to provide him with the 3:1 commode because he does not have insurance.  He then noted that he has a person who can pick up the hyzaar today at [facility]. Informed him that the prescription is at [facility] pharmacy. He would like it transferred to [facility].  Georgiana Shore Ausdall, [facility] transferred the prescription as he requested and said it would be $10.  This information was shared with the patient and he said that his friend would pick it up today.  Update regarding hyzaar  provided to Dr Delford Field.

## 2020-04-09 ENCOUNTER — Other Ambulatory Visit: Payer: Self-pay

## 2020-04-09 ENCOUNTER — Encounter: Payer: Self-pay | Admitting: Critical Care Medicine

## 2020-04-09 ENCOUNTER — Other Ambulatory Visit: Payer: Self-pay | Admitting: Critical Care Medicine

## 2020-04-09 ENCOUNTER — Ambulatory Visit: Payer: Self-pay | Attending: Critical Care Medicine | Admitting: Critical Care Medicine

## 2020-04-09 VITALS — BP 98/62 | HR 96 | Temp 97.9°F | Ht 75.0 in | Wt 234.4 lb

## 2020-04-09 DIAGNOSIS — E559 Vitamin D deficiency, unspecified: Secondary | ICD-10-CM

## 2020-04-09 DIAGNOSIS — I10 Essential (primary) hypertension: Secondary | ICD-10-CM

## 2020-04-09 DIAGNOSIS — F331 Major depressive disorder, recurrent, moderate: Secondary | ICD-10-CM

## 2020-04-09 DIAGNOSIS — Z7289 Other problems related to lifestyle: Secondary | ICD-10-CM

## 2020-04-09 DIAGNOSIS — E1165 Type 2 diabetes mellitus with hyperglycemia: Secondary | ICD-10-CM

## 2020-04-09 DIAGNOSIS — E1140 Type 2 diabetes mellitus with diabetic neuropathy, unspecified: Secondary | ICD-10-CM

## 2020-04-09 DIAGNOSIS — Z789 Other specified health status: Secondary | ICD-10-CM

## 2020-04-09 DIAGNOSIS — Z96641 Presence of right artificial hip joint: Secondary | ICD-10-CM

## 2020-04-09 DIAGNOSIS — F17200 Nicotine dependence, unspecified, uncomplicated: Secondary | ICD-10-CM

## 2020-04-09 MED ORDER — GABAPENTIN 300 MG PO CAPS: 600.0000 mg | ORAL_CAPSULE | Freq: Three times a day (TID) | ORAL | 1 refills | Status: DC

## 2020-04-09 MED ORDER — TRUE METRIX METER W/DEVICE KIT: PACK | 0 refills | Status: AC

## 2020-04-09 MED ORDER — VITAMIN D (ERGOCALCIFEROL) 1.25 MG (50000 UNIT) PO CAPS: 50000.0000 [IU] | ORAL_CAPSULE | ORAL | 5 refills | Status: DC

## 2020-04-09 MED ORDER — METFORMIN HCL 500 MG PO TABS: 500.0000 mg | ORAL_TABLET | Freq: Two times a day (BID) | ORAL | 3 refills | Status: DC

## 2020-04-09 MED ORDER — TRUEPLUS LANCETS 28G MISC: 1 refills | Status: DC

## 2020-04-09 MED ORDER — TRUE METRIX BLOOD GLUCOSE TEST VI STRP: ORAL_STRIP | 12 refills | Status: DC

## 2020-04-09 MED ORDER — LOSARTAN POTASSIUM-HCTZ 100-25 MG PO TABS: ORAL_TABLET | ORAL | 1 refills | Status: DC

## 2020-04-09 MED FILL — ?ERGOCALCIFEROL 50000 UNITC: 1.25 MG | 35 days supply | Qty: 5 | Fill #0

## 2020-04-09 MED FILL — !TRUE METRIX BLOOD GLUCOSE: 1 days supply | Qty: 1 | Fill #0

## 2020-04-09 MED FILL — TRUE METRIX TEST STRIP: 25 days supply | Qty: 100 | Fill #0

## 2020-04-09 MED FILL — METFORMIN HCL 500 MG TABS: 500 | 30 days supply | Qty: 60 | Fill #0

## 2020-04-09 MED FILL — TRUEplus LANCETS 28G MISC: 50 days supply | Qty: 100 | Fill #0

## 2020-04-09 MED FILL — GABAPENTIN 300 MG CAPSULE: 300 | 30 days supply | Qty: 180 | Fill #0

## 2020-04-09 NOTE — Assessment & Plan Note (Signed)
Alcohol use currently is decreased

## 2020-04-09 NOTE — Assessment & Plan Note (Signed)
Status post total hip replacement  Per orthopedics

## 2020-04-09 NOTE — Assessment & Plan Note (Signed)
Patient counseled as to tobacco use

## 2020-04-09 NOTE — Progress Notes (Signed)
Subjective:    Patient ID: Chad Bautista, male    DOB: [birth year], 44 y.o.   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 The patient currently is homeless and just recently was released from the criminal justice system in prison.  He was in the justice system due to multiple DUIs and trying to allude a police chase while intoxicated.  Patient does have a lifelong history of stress and anxiety and grew up in a home in which his father left him in early age she does not communicate with the father.  The patient currently is at the [place] homeless shelter but is working on a potential apartment within the next 30 days.  Patient does smoke a half a pack a day of cigarettes.  He states he drinks about occasional can of beer 2 or 3 days a week.  When I met the patient the homeless shelter we initiated losartan HCT for hypertension and did obtain for the patient orthopedic appointment.  He has the upcoming appointment on February 8 for orthopedics.  Pain has been quite severe and has been managing this with meloxicam daily and I did give him a very short course of opioids to use only as needed.  He has not been overusing these opioids and [employer] showed no other prescribers.  The patient has no other medical concerns at this visit.  He is here for additional blood work and to establish a primary care   04/09/2020 Since last OV has had R THR. Overall the patient's pain is markedly improved and is able to ambulate without any assistance.  Patient is now in an independent apartment and has furniture as well.  He only notes some swelling in the thigh near the incision site.  He no longer is drinking alcohol.  He is weaning himself off the oxycodone  He has an appointment in a week to have his  sutures removed The patient does have a history of diabetes and note had a hemoglobin A1c of 6.8 in the hospital with blood sugars in the 120-150 range.  The patient had lost significant weight and had been on insulin in the past  Past Medical History:  Diagnosis Date  . Diabetes mellitus without complication ([place])   . Head injury with loss of consciousness ([place])   . Hypertension      Family History  Problem Relation Age of Onset  . Heart disease Mother      Social History   Socioeconomic History  . Marital status: Single    Spouse name: Not on file  . Number of children: Not on file  . Years of education: Not on file  . Highest education level: Not on file  Occupational History  . Not on file  Tobacco Use  . Smoking status: Current Every Day Smoker    Packs/day: 0.50    Years: 30.00    Pack years: 15.00  . Smokeless tobacco: Never Used  Substance and Sexual Activity  . Alcohol use: Not Currently    Comment: rare- beer  . Drug use: Not Currently    Types: Cocaine    Comment: hx of cocaine use, states that he has been clean five years  . Sexual activity: Not Currently  Other Topics Concern  . Not on file  Social History Narrative  . Not on file   Social Determinants of Health   Financial Resource Strain:   . Difficulty of Paying Living Expenses:   Food Insecurity:   . Worried About Charity fundraiser in the Last Year:   . Arboriculturist in the Last Year:   Transportation Needs:   . Film/video editor (Medical):   Marland Kitchen Lack of Transportation (Non-Medical):   Physical Activity:   . Days of Exercise per Week:   . Minutes of Exercise per Session:   Stress:   . Feeling of Stress :   Social Connections:   . Frequency of Communication with Friends and Family:   . Frequency of Social Gatherings with Friends and Family:   . Attends Religious Services:   . Active Member of Clubs or Organizations:   . Attends Archivist Meetings:   Marland Kitchen Marital Status:     Intimate Partner Violence:   . Fear of Current or Ex-Partner:   . Emotionally Abused:   Marland Kitchen Physically Abused:   . Sexually Abused:      Allergies  Allergen Reactions  . Tramadol     Seizure     Outpatient Medications Prior to Visit  Medication Sig Dispense Refill  . aspirin 81 MG chewable tablet Chew 1 tablet (81 mg total) by mouth 2 (two) times daily. 84 tablet 0  . docusate sodium (COLACE) 100 MG capsule Take 1 capsule (100 mg total) by mouth 2 (two) times daily. 10 capsule 0  . methocarbamol (ROBAXIN) 750 MG tablet Take 1 tablet (750 mg total) by mouth every 6 (six) hours as needed for muscle spasms. 30 tablet 0  . ondansetron (ZOFRAN) 4 MG tablet Take 1 tablet (4 mg total) by mouth every 6 (six) hours as needed for nausea. 20 tablet 0  . oxyCODONE (OXY IR/ROXICODONE) 5 MG immediate release tablet Take 1-2 tablets (5-10 mg total) by mouth every 6 (six) hours as needed for moderate pain (pain score 4-6). 30 tablet 0  . losartan-hydrochlorothiazide (HYZAAR) 100-25 MG tablet Take 1 tablet by mouth daily. 30 tablet 1  . oxyCODONE (OXYCONTIN) 10 mg 12 hr tablet Take 1 tablet (10 mg total) by mouth every 12 (twelve) hours. (Patient not taking: Reported on 04/09/2020) 6 tablet 0  . Vitamin D, Ergocalciferol, (DRISDOL) 1.25 MG (50000 UNIT) CAPS capsule Take 1 capsule (50,000 Units total) by mouth every 7 (seven) days. (Patient not taking: Reported on 03/27/2020) 5 capsule 5   No facility-administered medications prior to visit.      Review of Systems  Constitutional: Positive for fatigue. Negative for fever.  HENT: Negative.   Respiratory: Negative for cough, shortness of breath and wheezing.   Cardiovascular: Positive for leg swelling. Negative for palpitations.  Gastrointestinal: Negative.   Genitourinary: Negative.   Musculoskeletal:       R hip pain and shoulder pain   Neurological: Negative for dizziness and weakness.  Psychiatric/Behavioral: Positive for dysphoric mood and  sleep disturbance. Negative for behavioral problems, decreased concentration and self-injury. The patient is nervous/anxious.        Objective:   Physical Exam  Vitals:   04/09/20 1048  BP: 98/62  Pulse: 96  Temp: 97.9 F (36.6 C)  TempSrc: Temporal  SpO2: 97%  Weight: 234 lb 6.4 oz (106.3 kg)  Height: 6' 3"  (1.905 m)    Gen: Pleasant, well-nourished, in no distress,  normal affect  ENT: No lesions,  mouth clear,  oropharynx clear, no  postnasal drip  Neck: No JVD, no TMG, no carotid bruits  Lungs: No use of accessory muscles, no dullness to percussion, clear without rales or rhonchi  Cardiovascular: RRR, heart sounds normal, no murmur or gallops, no peripheral edema  Abdomen: soft and NT, no HSM,  BS normal  Musculoskeletal: No deformities, no cyanosis or  Incision site in the right anterior thigh area looks good and the wound is healing dressing was changed There is no evidence of infection at the site of the wound The patient's feet were examined there is some toenail fungus and the site of the wound in the left foot and the plantar aspect is healing  Neuro: alert, non focal  Skin: Warm, no lesions or rashes      Assessment & Plan:  I personally reviewed all images and lab data in the [facility] system as well as any outside material available during this office visit and agree with the  radiology impressions.   Essential hypertension Hypertension with low blood pressure at this time and note in the hospital had elevated potassium level on April 21  Plan will be to hold losartan HCT for now and reevaluate in follow-up with a basic metabolic panel next visit  Moderate episode of recurrent major depressive disorder ([place]) Depression seems to be markedly improved at this time  Alcohol use Alcohol use currently is decreased  Status post total replacement of right hip Status post total hip replacement  Per orthopedics  Tobacco use disorder Patient counseled as to  tobacco use  Vitamin D deficiency Patient's vitamin D prescription was refilled  Uncontrolled type 2 diabetes mellitus with hyperglycemia (HCC) Elevated hemoglobin A1c with elevated blood glucoses in the hospital  Diabetic diet was reviewed with the patient  Begin Metformin 500 mg twice daily and a glucometer was issued to the patient for checking blood sugars daily  Gabapentin was refilled for neuropathy in the feet  Neuropathy due to type 2 diabetes mellitus ([place]) Have issued a gabapentin prescription for neuropathy in the feet   Diagnoses and all orders for this visit:  Uncontrolled type 2 diabetes mellitus with hyperglycemia ([place])  Vitamin D deficiency  Status post total replacement of right hip  Tobacco use disorder  Essential hypertension  Moderate episode of recurrent major depressive disorder ([place])  Alcohol use  Neuropathy due to type 2 diabetes mellitus ([place])  Other orders -     losartan-hydrochlorothiazide (HYZAAR) 100-25 MG tablet; HOLD for NOW -     Vitamin D, Ergocalciferol, (DRISDOL) 1.25 MG (50000 UNIT) CAPS capsule; Take 1 capsule (50,000 Units total) by mouth every 7 (seven) days. -     metFORMIN (GLUCOPHAGE) 500 MG tablet; Take 1 tablet (500 mg total) by mouth 2 (two) times daily with a meal. -     TRUEplus Lancets 28G MISC; Use to measure blood sugar twice a day -     Blood Glucose Monitoring Suppl (TRUE METRIX METER) w/Device KIT; Use to measure blood sugar twice a day -     glucose blood (TRUE METRIX BLOOD GLUCOSE TEST) test strip; Use as instructed -     gabapentin (NEURONTIN) 300 MG capsule; Take 2 capsules (600 mg total) by mouth 3 (three) times daily.  The patient was counseled to obtain a Covid vaccine will hold off on the tetanus vaccine until the Covid vaccine is obtained

## 2020-04-09 NOTE — Assessment & Plan Note (Signed)
Have issued a gabapentin prescription for neuropathy in the feet

## 2020-04-09 NOTE — Assessment & Plan Note (Signed)
Patient's vitamin D prescription was refilled

## 2020-04-09 NOTE — Assessment & Plan Note (Signed)
Depression seems to be markedly improved at this time

## 2020-04-09 NOTE — Patient Instructions (Signed)
Start Metformin one twice daily Start Gabapentin two three times a day Use glucose meter to check blood sugar once a day Follow diabetic diet below Return in one month Keep orthopedic appointment   Diabetes Mellitus and Nutrition, Adult When you have diabetes (diabetes mellitus), it is very important to have healthy eating habits because your blood sugar (glucose) levels are greatly affected by what you eat and drink. Eating healthy foods in the appropriate amounts, at about the same times every day, can help you:  Control your blood glucose.  Lower your risk of heart disease.  Improve your blood pressure.  Reach or maintain a healthy weight. Every person with diabetes is different, and each person has different needs for a meal plan. Your health care provider may recommend that you work with a diet and nutrition specialist (dietitian) to make a meal plan that is best for you. Your meal plan may vary depending on factors such as:  The calories you need.  The medicines you take.  Your weight.  Your blood glucose, blood pressure, and cholesterol levels.  Your activity level.  Other health conditions you have, such as heart or kidney disease. How do carbohydrates affect me? Carbohydrates, also called carbs, affect your blood glucose level more than any other type of food. Eating carbs naturally raises the amount of glucose in your blood. Carb counting is a method for keeping track of how many carbs you eat. Counting carbs is important to keep your blood glucose at a healthy level, especially if you use insulin or take certain oral diabetes medicines. It is important to know how many carbs you can safely have in each meal. This is different for every person. Your dietitian can help you calculate how many carbs you should have at each meal and for each snack. Foods that contain carbs include:  Bread, cereal, rice, pasta, and crackers.  Potatoes and corn.  Peas, beans, and  lentils.  Milk and yogurt.  Fruit and juice.  Desserts, such as cakes, cookies, ice cream, and candy. How does alcohol affect me? Alcohol can cause a sudden decrease in blood glucose (hypoglycemia), especially if you use insulin or take certain oral diabetes medicines. Hypoglycemia can be a life-threatening condition. Symptoms of hypoglycemia (sleepiness, dizziness, and confusion) are similar to symptoms of having too much alcohol. If your health care provider says that alcohol is safe for you, follow these guidelines:  Limit alcohol intake to no more than 1 drink per day for nonpregnant women and 2 drinks per day for men. One drink equals 12 oz of beer, 5 oz of wine, or 1 oz of hard liquor.  Do not drink on an empty stomach.  Keep yourself hydrated with water, diet soda, or unsweetened iced tea.  Keep in mind that regular soda, juice, and other mixers may contain a lot of sugar and must be counted as carbs. What are tips for following this plan?  Reading food labels  Start by checking the serving size on the "Nutrition Facts" label of packaged foods and drinks. The amount of calories, carbs, fats, and other nutrients listed on the label is based on one serving of the item. Many items contain more than one serving per package.  Check the total grams (g) of carbs in one serving. You can calculate the number of servings of carbs in one serving by dividing the total carbs by 15. For example, if a food has 30 g of total carbs, it would be equal to  2 servings of carbs.  Check the number of grams (g) of saturated and trans fats in one serving. Choose foods that have low or no amount of these fats.  Check the number of milligrams (mg) of salt (sodium) in one serving. Most people should limit total sodium intake to less than 2,300 mg per day.  Always check the nutrition information of foods labeled as "low-fat" or "nonfat". These foods may be higher in added sugar or refined carbs and should  be avoided.  Talk to your dietitian to identify your daily goals for nutrients listed on the label. Shopping  Avoid buying canned, premade, or processed foods. These foods tend to be high in fat, sodium, and added sugar.  Shop around the outside edge of the grocery store. This includes fresh fruits and vegetables, bulk grains, fresh meats, and fresh dairy. Cooking  Use low-heat cooking methods, such as baking, instead of high-heat cooking methods like deep frying.  Cook using healthy oils, such as olive, canola, or sunflower oil.  Avoid cooking with butter, cream, or high-fat meats. Meal planning  Eat meals and snacks regularly, preferably at the same times every day. Avoid going long periods of time without eating.  Eat foods high in fiber, such as fresh fruits, vegetables, beans, and whole grains. Talk to your dietitian about how many servings of carbs you can eat at each meal.  Eat 4-6 ounces (oz) of lean protein each day, such as lean meat, chicken, fish, eggs, or tofu. One oz of lean protein is equal to: ? 1 oz of meat, chicken, or fish. ? 1 egg. ?  cup of tofu.  Eat some foods each day that contain healthy fats, such as avocado, nuts, seeds, and fish. Lifestyle  Check your blood glucose regularly.  Exercise regularly as told by your health care provider. This may include: ? 150 minutes of moderate-intensity or vigorous-intensity exercise each week. This could be brisk walking, biking, or water aerobics. ? Stretching and doing strength exercises, such as yoga or weightlifting, at least 2 times a week.  Take medicines as told by your health care provider.  Do not use any products that contain nicotine or tobacco, such as cigarettes and e-cigarettes. If you need help quitting, ask your health care provider.  Work with a Veterinary surgeon or diabetes educator to identify strategies to manage stress and any emotional and social challenges. Questions to ask a health care  provider  Do I need to meet with a diabetes educator?  Do I need to meet with a dietitian?  What number can I call if I have questions?  When are the best times to check my blood glucose? Where to find more information:  American Diabetes Association: diabetes.org  Academy of Nutrition and Dietetics: www.eatright.[employer] of Diabetes and Digestive and Kidney Diseases (NIH): CarFlippers.tn Summary  A healthy meal plan will help you control your blood glucose and maintain a healthy lifestyle.  Working with a diet and nutrition specialist (dietitian) can help you make a meal plan that is best for you.  Keep in mind that carbohydrates (carbs) and alcohol have immediate effects on your blood glucose levels. It is important to count carbs and to use alcohol carefully. This information is not intended to replace advice given to you by your health care provider. Make sure you discuss any questions you have with your health care provider. Document Revised: 11/13/2017 Document Reviewed: 01/05/2017 Elsevier Patient Education  2020 [employer].   Diabetes Mellitus  and Foot Care Foot care is an important part of your health, especially when you have diabetes. Diabetes may cause you to have problems because of poor blood flow (circulation) to your feet and legs, which can cause your skin to:  Become thinner and drier.  Break more easily.  Heal more slowly.  Peel and crack. You may also have nerve damage (neuropathy) in your legs and feet, causing decreased feeling in them. This means that you may not notice minor injuries to your feet that could lead to more serious problems. Noticing and addressing any potential problems early is the best way to prevent future foot problems. How to care for your feet Foot hygiene  Wash your feet daily with warm water and mild soap. Do not use hot water. Then, pat your feet and the areas between your toes until they are completely dry.  Do not soak your feet as this can dry your skin.  Trim your toenails straight across. Do not dig under them or around the cuticle. File the edges of your nails with an emery board or nail file.  Apply a moisturizing lotion or petroleum jelly to the skin on your feet and to dry, brittle toenails. Use lotion that does not contain alcohol and is unscented. Do not apply lotion between your toes. Shoes and socks  Wear clean socks or stockings every day. Make sure they are not too tight. Do not wear knee-high stockings since they may decrease blood flow to your legs.  Wear shoes that fit properly and have enough cushioning. Always look in your shoes before you put them on to be sure there are no objects inside.  To break in new shoes, wear them for just a few hours a day. This prevents injuries on your feet. Wounds, scrapes, corns, and calluses  Check your feet daily for blisters, cuts, bruises, sores, and redness. If you cannot see the bottom of your feet, use a mirror or ask someone for help.  Do not cut corns or calluses or try to remove them with medicine.  If you find a minor scrape, cut, or break in the skin on your feet, keep it and the skin around it clean and dry. You may clean these areas with mild soap and water. Do not clean the area with peroxide, alcohol, or iodine.  If you have a wound, scrape, corn, or callus on your foot, look at it several times a day to make sure it is healing and not infected. Check for: ? Redness, swelling, or pain. ? Fluid or blood. ? Warmth. ? Pus or a bad smell. General instructions  Do not cross your legs. This may decrease blood flow to your feet.  Do not use heating pads or hot water bottles on your feet. They may burn your skin. If you have lost feeling in your feet or legs, you may not know this is happening until it is too late.  Protect your feet from hot and cold by wearing shoes, such as at the beach or on hot pavement.  Schedule a complete  foot exam at least once a year (annually) or more often if you have foot problems. If you have foot problems, report any cuts, sores, or bruises to your health care provider immediately. Contact a health care provider if:  You have a medical condition that increases your risk of infection and you have any cuts, sores, or bruises on your feet.  You have an injury that is not healing.  You have redness on your legs or feet.  You feel burning or tingling in your legs or feet.  You have pain or cramps in your legs and feet.  Your legs or feet are numb.  Your feet always feel cold.  You have pain around a toenail. Get help right away if:  You have a wound, scrape, corn, or callus on your foot and: ? You have pain, swelling, or redness that gets worse. ? You have fluid or blood coming from the wound, scrape, corn, or callus. ? Your wound, scrape, corn, or callus feels warm to the touch. ? You have pus or a bad smell coming from the wound, scrape, corn, or callus. ? You have a fever. ? You have a red line going up your leg. Summary  Check your feet every day for cuts, sores, red spots, swelling, and blisters.  Moisturize feet and legs daily.  Wear shoes that fit properly and have enough cushioning.  If you have foot problems, report any cuts, sores, or bruises to your health care provider immediately.  Schedule a complete foot exam at least once a year (annually) or more often if you have foot problems. This information is not intended to replace advice given to you by your health care provider. Make sure you discuss any questions you have with your health care provider. Document Revised: 08/24/2019 Document Reviewed: 01/02/2017 Elsevier Patient Education  2020 [employer].

## 2020-04-09 NOTE — Assessment & Plan Note (Signed)
Hypertension with low blood pressure at this time and note in the hospital had elevated potassium level on April 21  Plan will be to hold losartan HCT for now and reevaluate in follow-up with a basic metabolic panel next visit

## 2020-04-09 NOTE — Progress Notes (Signed)
Hospital f/u  Need refills for Vit D

## 2020-04-09 NOTE — Assessment & Plan Note (Signed)
Elevated hemoglobin A1c with elevated blood glucoses in the hospital  Diabetic diet was reviewed with the patient  Begin Metformin 500 mg twice daily and a glucometer was issued to the patient for checking blood sugars daily  Gabapentin was refilled for neuropathy in the feet

## 2020-04-11 ENCOUNTER — Telehealth: Payer: Self-pay

## 2020-04-11 NOTE — Telephone Encounter (Signed)
Patient called triage phone stating he has swelling around his incision site. States she does not have a fever and no redness to the incision site. No swelling or leg pain.. I discussed this with Dr. Roda Shutters who advised pt to be more aggressive with icing. Pt was advised to call back if anything changes.

## 2020-04-17 ENCOUNTER — Ambulatory Visit (INDEPENDENT_AMBULATORY_CARE_PROVIDER_SITE_OTHER): Payer: Self-pay | Admitting: Physician Assistant

## 2020-04-17 ENCOUNTER — Other Ambulatory Visit: Payer: Self-pay

## 2020-04-17 ENCOUNTER — Encounter: Payer: Self-pay | Admitting: Orthopaedic Surgery

## 2020-04-17 DIAGNOSIS — Z96641 Presence of right artificial hip joint: Secondary | ICD-10-CM

## 2020-04-17 NOTE — Progress Notes (Signed)
   Post-Op Visit Note   Patient: Chad Bautista           Date of Birth: 31-Aug-1976           MRN: [id number] Visit Date: 04/17/2020 PCP: Storm Frisk, MD   Assessment & Plan:  Chief Complaint:  Chief Complaint  Patient presents with  . Right Hip - Pain   Visit Diagnoses:  1. History of total hip replacement, right     Plan: Patient is a pleasant 44 year old gentleman who comes in today 2 weeks out right anterior total hip replacement 04/02/2020.  He has been doing very well.  He is taking ibuprofen for pain.  He notes slight burning to the incision but nothing more.  No fevers or chills.  He has been working with home health physical therapy making great progress.  He is ambulating without assistance.  Examination of his right hip reveals a well-healing surgical incision with nylon sutures in place.  No evidence of infection or cellulitis.  Calves are soft and nontender.  He is neurovascular intact distally.  At this point, he will continue with home health physical therapy and then transition to a home exercise program.  Sutures removed today.  Dental prophylaxis reinforced.  Follow-up with [place] in 4 weeks time for repeat evaluation and AP pelvis lateral right hip x-rays.  Call with concerns or questions.  Follow-Up Instructions: Return in about 4 weeks (around 05/15/2020).   Orders:  No orders of the defined types were placed in this encounter.  No orders of the defined types were placed in this encounter.   Imaging: No new imaging  PMFS History: Patient Active Problem List   Diagnosis Date Noted  . Uncontrolled type 2 diabetes mellitus with hyperglycemia (HCC) 04/09/2020  . Neuropathy due to type 2 diabetes mellitus (HCC) 04/09/2020  . 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  . Vitamin D deficiency 01/19/2020  . Tobacco use disorder 01/18/2020  . Alcohol use 01/18/2020  . Essential hypertension 01/18/2020  . Moderate episode of recurrent major depressive  disorder (HCC) 01/18/2020   Past Medical History:  Diagnosis Date  . Diabetes mellitus without complication (HCC)   . Head injury with loss of consciousness (HCC)   . Hypertension     Family History  Problem Relation Age of Onset  . Heart disease Mother     Past Surgical History:  Procedure Laterality Date  . ANKLE FRACTURE SURGERY    . APPENDECTOMY    . NASAL SEPTUM SURGERY    . TONSILLECTOMY    . TOTAL HIP ARTHROPLASTY Right 04/02/2020   Procedure: RIGHT TOTAL HIP ARTHROPLASTY ANTERIOR APPROACH;  Surgeon: Tarry Kos, MD;  Location: [facility] OR;  Service: Orthopedics;  Laterality: Right;   Social History   Occupational History  . Not on file  Tobacco Use  . Smoking status: Current Every Day Smoker    Packs/day: 0.50    Years: 30.00    Pack years: 15.00  . Smokeless tobacco: Never Used  Substance and Sexual Activity  . Alcohol use: Not Currently    Comment: rare- beer  . Drug use: Not Currently    Types: Cocaine    Comment: hx of cocaine use, states that he has been clean five years  . Sexual activity: Not Currently

## 2020-04-23 ENCOUNTER — Ambulatory Visit: Payer: Medicaid Other | Admitting: Orthopedic Surgery

## 2020-04-23 ENCOUNTER — Ambulatory Visit (INDEPENDENT_AMBULATORY_CARE_PROVIDER_SITE_OTHER): Payer: Self-pay | Admitting: Orthopedic Surgery

## 2020-04-23 ENCOUNTER — Telehealth: Payer: Self-pay | Admitting: Critical Care Medicine

## 2020-04-23 ENCOUNTER — Other Ambulatory Visit: Payer: Self-pay

## 2020-04-23 ENCOUNTER — Encounter: Payer: Self-pay | Admitting: Orthopedic Surgery

## 2020-04-23 ENCOUNTER — Telehealth: Payer: Self-pay | Admitting: Radiology

## 2020-04-23 VITALS — Ht 75.0 in | Wt 234.0 lb

## 2020-04-23 DIAGNOSIS — L03031 Cellulitis of right toe: Secondary | ICD-10-CM

## 2020-04-23 DIAGNOSIS — L03115 Cellulitis of right lower limb: Secondary | ICD-10-CM

## 2020-04-23 MED ORDER — DOXYCYCLINE HYCLATE 100 MG PO TABS: 100.0000 mg | ORAL_TABLET | Freq: Two times a day (BID) | ORAL | 0 refills | Status: DC

## 2020-04-23 MED ORDER — MUPIROCIN 2 % EX OINT: 1.0000 "application " | TOPICAL_OINTMENT | Freq: Two times a day (BID) | CUTANEOUS | 3 refills | Status: DC

## 2020-04-23 MED ORDER — OXYCODONE-ACETAMINOPHEN 5-325 MG PO TABS: 1.0000 | ORAL_TABLET | Freq: Three times a day (TID) | ORAL | 0 refills | Status: DC | PRN

## 2020-04-23 MED ORDER — OXYCODONE-ACETAMINOPHEN 10-325 MG PO TABS: 1.0000 | ORAL_TABLET | Freq: Three times a day (TID) | ORAL | 0 refills | Status: DC | PRN

## 2020-04-23 NOTE — Progress Notes (Signed)
Office Visit Note   Patient: Chad Bautista           Date of Birth: 1976-07-04           MRN: [id number] Visit Date: 04/23/2020              Requested by: Storm Frisk, MD [address] [place],  [place] [address] PCP: Storm Frisk, MD  Chief Complaint  Patient presents with  . Right Hip - Follow-up    THA 04/02/2020      HPI: Patient is a 44 year old gentleman who is 3 weeks status post right total hip arthroplasty.  Patient states that he has been able to express drainage from the surgical incision he states that this was dark and bloody in color.  Patient also complains of painful cellulitis of the right great toe.  Patient is a daily smoker.  Assessment & Plan: Visit Diagnoses:  1. Cellulitis of great toe of right foot   2. Cellulitis of right hip     Plan: Discussed the importance of smoking cessation to help resolve these infections.  A prescription was called in for doxycycline 100 mg twice a day Bactroban to apply to the surgical incision as well as the ulcer on the plantar aspect of the right great toe and a prescription for Percocet for pain.  Follow-Up Instructions: Return in about 1 week (around 04/30/2020).   Ortho Exam  Patient is alert, oriented, no adenopathy, well-dressed, normal affect, normal respiratory effort. Examination the surgical incision of the right hip is well approximated there is 1 area of granulation tissue that is red but there is no surrounding cellulitis this almost appears like a sterile stitch abscess.  I cannot express any drainage.  Examination the right great toe the entire toe is cellulitic he has a Wagner grade 1 ulcer in the plantar aspect of the toe after informed consent a 10 blade knife was used to debride the skin and soft tissue back to healthy viable granulation tissue the ulcer is 5 mm in diameter 1 mm deep but does not probe to bone or tendon.  Does not have a paronychial infection.  Imaging: No results found. No  images are attached to the encounter.  Labs: Lab Results  Component Value Date   HGBA1C 6.8 (H) 03/28/2020     Lab Results  Component Value Date   ALBUMIN 3.4 (L) 03/27/2020   ALBUMIN 4.0 01/18/2020   ALBUMIN 4.0 12/16/2019    No results found for: MG Lab Results  Component Value Date   VD25OH 20.9 (L) 01/18/2020    No results found for: PREALBUMIN CBC EXTENDED Latest Ref Rng & Units 04/03/2020 03/27/2020 12/16/2019  WBC 4.0 - 10.5 K/uL 6.0 6.7 5.7  RBC 4.22 - 5.81 MIL/uL 3.47(L) 4.78 4.66  HGB 13.0 - 17.0 g/dL 10.5(L) 14.4 14.8  HCT 39.0 - 52.0 % 32.2(L) 44.4 45.8  PLT 150 - 400 K/uL 202 277 210  NEUTROABS 1.7 - 7.7 K/uL - 3.5 4.4  LYMPHSABS 0.7 - 4.0 K/uL - 2.2 1.0     Body mass index is 29.25 kg/m.  Orders:  No orders of the defined types were placed in this encounter.  No orders of the defined types were placed in this encounter.    Procedures: No procedures performed  Clinical Data: No additional findings.  ROS:  All other systems negative, except as noted in the HPI. Review of Systems  Objective: Vital Signs: Ht 6\' 3"  (1.905 m)  Wt 234 lb (106.1 kg)   BMI 29.25 kg/m   Specialty Comments:  No specialty comments available.  PMFS History: Patient Active Problem List   Diagnosis Date Noted  . Uncontrolled type 2 diabetes mellitus with hyperglycemia ([place]) 04/09/2020  . Neuropathy due to type 2 diabetes mellitus ([place]) 04/09/2020  . 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  . Vitamin D deficiency 01/19/2020  . Tobacco use disorder 01/18/2020  . Alcohol use 01/18/2020  . Essential hypertension 01/18/2020  . Moderate episode of recurrent major depressive disorder ([place]) 01/18/2020   Past Medical History:  Diagnosis Date  . Diabetes mellitus without complication ([place])   . Head injury with loss of consciousness ([place])   . Hypertension     Family History  Problem Relation Age of Onset  . Heart disease Mother     Past Surgical  History:  Procedure Laterality Date  . ANKLE FRACTURE SURGERY    . APPENDECTOMY    . NASAL SEPTUM SURGERY    . TONSILLECTOMY    . TOTAL HIP ARTHROPLASTY Right 04/02/2020   Procedure: RIGHT TOTAL HIP ARTHROPLASTY ANTERIOR APPROACH;  Surgeon: Leandrew Koyanagi, MD;  Location: [facility];  Service: Orthopedics;  Laterality: Right;   Social History   Occupational History  . Not on file  Tobacco Use  . Smoking status: Current Every Day Smoker    Packs/day: 0.50    Years: 30.00    Pack years: 15.00  . Smokeless tobacco: Never Used  Substance and Sexual Activity  . Alcohol use: Not Currently    Comment: rare- beer  . Drug use: Not Currently    Types: Cocaine    Comment: hx of cocaine use, states that he has been clean five years  . Sexual activity: Not Currently

## 2020-04-23 NOTE — Telephone Encounter (Signed)
Patient just called in on the triage line, stating that he had surgery on 04/02/2020 for hip replacement with Dr. Roda Shutters. He said that they took out stitches at the last office visit, and that over the weekend he was getting ready for bed and took off his shorts and noticed a dark brown drainage coming from the incision. I tried to let him know that we would have to see him, every time I tried to speak he was interrupting me. He said "that M**ther F**ker will be calling me in an antibiotic today."  I advised that we would need to see him so that a culture could be done to make sure that he was put on the correct medication to begin with, and if not we would have to change it to the correct one but we would not know until we get the culture back.  It took me several tries to get this out to him.  It states that he knows who I am, and I advised that he doesn't know me, as I have never meet him before. He said that I was short and kind of thick.  I advised him that Dr. Lajoyce Corners would see him today at 3pm, and he repeated it to me.  I advised him multiple times during our conversation that if I could speak to him without him interrupting me that I could get him scheduled and take care of the important details, he kept interrupting me finally I gave him his appt time again and hung the phone-up--he asked for my name and I was not comfortable giving it to him.

## 2020-04-23 NOTE — Telephone Encounter (Signed)
Patient called in to inform pcp that he has had his stiches removed from the hip surgery. Patient stated a bandage was put on top of the scar and was told to let it heal. Patient stated that it currently has pus/discharge coming out of the wound and stinks really bad. Please follow up at your earliest convenience.

## 2020-04-23 NOTE — Telephone Encounter (Signed)
Wanted to make sure that you were aware.

## 2020-04-23 NOTE — Telephone Encounter (Signed)
ABX and mupiricin cream sent already  Percocet did not go through so I sent to [facility] on [address] and spoke to the patient

## 2020-04-24 ENCOUNTER — Emergency Department (HOSPITAL_COMMUNITY)
Admission: EM | Admit: 2020-04-24 | Discharge: 2020-04-25 | Disposition: A | Discharge status: Home / Self Care | Payer: Medicaid Other | Attending: Emergency Medicine | Admitting: Emergency Medicine

## 2020-04-24 ENCOUNTER — Telehealth: Payer: Self-pay

## 2020-04-24 DIAGNOSIS — F1490 Cocaine use, unspecified, uncomplicated: Secondary | ICD-10-CM

## 2020-04-24 DIAGNOSIS — F1110 Opioid abuse, uncomplicated: Secondary | ICD-10-CM | POA: Insufficient documentation

## 2020-04-24 DIAGNOSIS — F1510 Other stimulant abuse, uncomplicated: Secondary | ICD-10-CM | POA: Insufficient documentation

## 2020-04-24 DIAGNOSIS — F22 Delusional disorders: Secondary | ICD-10-CM | POA: Insufficient documentation

## 2020-04-24 DIAGNOSIS — Z96641 Presence of right artificial hip joint: Secondary | ICD-10-CM | POA: Insufficient documentation

## 2020-04-24 DIAGNOSIS — F1590 Other stimulant use, unspecified, uncomplicated: Secondary | ICD-10-CM | POA: Diagnosis present

## 2020-04-24 DIAGNOSIS — Z7984 Long term (current) use of oral hypoglycemic drugs: Secondary | ICD-10-CM | POA: Insufficient documentation

## 2020-04-24 DIAGNOSIS — E119 Type 2 diabetes mellitus without complications: Secondary | ICD-10-CM | POA: Insufficient documentation

## 2020-04-24 DIAGNOSIS — Z79899 Other long term (current) drug therapy: Secondary | ICD-10-CM | POA: Insufficient documentation

## 2020-04-24 DIAGNOSIS — F1410 Cocaine abuse, uncomplicated: Secondary | ICD-10-CM | POA: Insufficient documentation

## 2020-04-24 DIAGNOSIS — F1910 Other psychoactive substance abuse, uncomplicated: Secondary | ICD-10-CM | POA: Insufficient documentation

## 2020-04-24 DIAGNOSIS — I10 Essential (primary) hypertension: Secondary | ICD-10-CM | POA: Insufficient documentation

## 2020-04-24 DIAGNOSIS — Z20822 Contact with and (suspected) exposure to covid-19: Secondary | ICD-10-CM | POA: Insufficient documentation

## 2020-04-24 DIAGNOSIS — F17210 Nicotine dependence, cigarettes, uncomplicated: Secondary | ICD-10-CM | POA: Insufficient documentation

## 2020-04-24 HISTORY — DX: Cocaine use, unspecified, uncomplicated: F14.90

## 2020-04-24 LAB — RAPID URINE DRUG SCREEN, HOSP PERFORMED
Amphetamines: POSITIVE — AB
Barbiturates: NOT DETECTED
Benzodiazepines: NOT DETECTED
Cocaine: POSITIVE — AB
Opiates: POSITIVE — AB
Tetrahydrocannabinol: NOT DETECTED

## 2020-04-24 LAB — CBC WITH DIFFERENTIAL/PLATELET
Abs Immature Granulocytes: 0.02 10*3/uL (ref 0.00–0.07)
Basophils Absolute: 0.1 10*3/uL (ref 0.0–0.1)
Basophils Relative: 1 %
Eosinophils Absolute: 0.4 10*3/uL (ref 0.0–0.5)
Eosinophils Relative: 6 %
HCT: 30.3 % — ABNORMAL LOW (ref 39.0–52.0)
Hemoglobin: 9.9 g/dL — ABNORMAL LOW (ref 13.0–17.0)
Immature Granulocytes: 0 %
Lymphocytes Relative: 23 %
Lymphs Abs: 1.6 10*3/uL (ref 0.7–4.0)
MCH: 29.6 pg (ref 26.0–34.0)
MCHC: 32.7 g/dL (ref 30.0–36.0)
MCV: 90.7 fL (ref 80.0–100.0)
Monocytes Absolute: 0.7 10*3/uL (ref 0.1–1.0)
Monocytes Relative: 10 %
Neutro Abs: 4.2 10*3/uL (ref 1.7–7.7)
Neutrophils Relative %: 60 %
Platelets: 292 10*3/uL (ref 150–400)
RBC: 3.34 MIL/uL — ABNORMAL LOW (ref 4.22–5.81)
RDW: 13.5 % (ref 11.5–15.5)
WBC: 7 10*3/uL (ref 4.0–10.5)
nRBC: 0 % (ref 0.0–0.2)

## 2020-04-24 LAB — COMPREHENSIVE METABOLIC PANEL
ALT: 47 U/L — ABNORMAL HIGH (ref 0–44)
AST: 76 U/L — ABNORMAL HIGH (ref 15–41)
Albumin: 3.6 g/dL (ref 3.5–5.0)
Alkaline Phosphatase: 94 U/L (ref 38–126)
Anion gap: 13 (ref 5–15)
BUN: 19 mg/dL (ref 6–20)
CO2: 21 mmol/L — ABNORMAL LOW (ref 22–32)
Calcium: 8.7 mg/dL — ABNORMAL LOW (ref 8.9–10.3)
Chloride: 102 mmol/L (ref 98–111)
Creatinine, Ser: 1.09 mg/dL (ref 0.61–1.24)
GFR calc Af Amer: >60 mL/min (ref >60)
GFR calc non Af Amer: >60 mL/min (ref >60)
Glucose, Bld: 107 mg/dL — ABNORMAL HIGH (ref 70–99)
Potassium: 3.6 mmol/L (ref 3.5–5.1)
Sodium: 136 mmol/L (ref 135–145)
Total Bilirubin: 0.7 mg/dL (ref 0.3–1.2)
Total Protein: 6.8 g/dL (ref 6.5–8.1)

## 2020-04-24 LAB — ACETAMINOPHEN LEVEL: Acetaminophen (Tylenol), Serum: <10 ug/mL — ABNORMAL LOW (ref 10–30)

## 2020-04-24 LAB — ETHANOL: Alcohol, Ethyl (B): <10 mg/dL (ref <10)

## 2020-04-24 LAB — SARS CORONAVIRUS 2 BY RT PCR (HOSPITAL ORDER, PERFORMED IN ~~LOC~~ HOSPITAL LAB): SARS Coronavirus 2: NEGATIVE

## 2020-04-24 LAB — SALICYLATE LEVEL: Salicylate Lvl: <7 mg/dL — ABNORMAL LOW (ref 7.0–30.0)

## 2020-04-24 MED ORDER — OXYCODONE HCL 5 MG PO TABS: 5.0000 mg | ORAL_TABLET | Freq: Three times a day (TID) | ORAL | Status: DC | PRN

## 2020-04-24 MED ORDER — METFORMIN HCL 500 MG PO TABS
500.0000 mg | ORAL_TABLET | Freq: Two times a day (BID) | ORAL | Status: DC
  Administered 2020-04-24 – 2020-04-25 (×2): 500 mg via ORAL
  Filled 2020-04-24 (×2): qty 1

## 2020-04-24 MED ORDER — OXYCODONE-ACETAMINOPHEN 5-325 MG PO TABS: 1.0000 | ORAL_TABLET | Freq: Three times a day (TID) | ORAL | Status: DC | PRN

## 2020-04-24 MED ORDER — GABAPENTIN 300 MG PO CAPS
600.0000 mg | ORAL_CAPSULE | Freq: Three times a day (TID) | ORAL | Status: DC
  Administered 2020-04-24 – 2020-04-25 (×2): 600 mg via ORAL
  Filled 2020-04-24 (×3): qty 2

## 2020-04-24 MED ORDER — MUPIROCIN 2 % EX OINT
1.0000 "application " | TOPICAL_OINTMENT | Freq: Two times a day (BID) | CUTANEOUS | Status: DC
  Filled 2020-04-24: qty 22

## 2020-04-24 MED ORDER — DOCUSATE SODIUM 100 MG PO CAPS
100.0000 mg | ORAL_CAPSULE | Freq: Two times a day (BID) | ORAL | Status: DC
  Administered 2020-04-24 – 2020-04-25 (×2): 100 mg via ORAL
  Filled 2020-04-24 (×2): qty 1

## 2020-04-24 MED ORDER — OXYCODONE-ACETAMINOPHEN 10-325 MG PO TABS: 1.0000 | ORAL_TABLET | Freq: Three times a day (TID) | ORAL | Status: DC | PRN

## 2020-04-24 MED ORDER — IBUPROFEN 200 MG PO TABS: 600.0000 mg | ORAL_TABLET | Freq: Three times a day (TID) | ORAL | Status: DC | PRN

## 2020-04-24 MED ORDER — DOXYCYCLINE HYCLATE 100 MG PO TABS
100.0000 mg | ORAL_TABLET | Freq: Two times a day (BID) | ORAL | Status: DC
  Administered 2020-04-24 – 2020-04-25 (×2): 100 mg via ORAL
  Filled 2020-04-24 (×2): qty 1

## 2020-04-24 NOTE — ED Triage Notes (Addendum)
Pt brought to [facility] ED by EMS. Pt voluntary. Pt was at [employer], pt is extremely paranoid.  Pt cooperative with changing out.  Pt has been doing meth. Pt did meth this AM.

## 2020-04-24 NOTE — BH Assessment (Signed)
Tele Assessment Note   Patient Name: Chad Bautista MRN: [id number] Referring Physician: Lyndel Safe, PA Location of Patient: [facility] Location of Provider: Behavioral Health TTS Department  Chad Bautista is an 44 y.o. male presenting to the ED via EMS after presenting with extreme paranoia while in [employer]. Patient appeared to be hard to arouse as Nurse Tech continued efforts to awaken patient for TTS assessment. When asked why are you here, patient stated "I don't know". Patient became very fidgety, rubbing his nose, face and head constantly. Patient displayed shakiness and tics. Patient unable to share why he was at [employer]. Patient denied SI and HI. Patient admitted to extreme paranoia and continually scanning the room. Patient reported using heroin, cocaine and .25 of methamphetamines. Patient UDS was positive for following, +amphetamines, +opiates, + cocaine. Patient denied prior inpatient treatment, suicide attempts and self-harming behaviors.  Patient admitted to having a psychiatrist but couldn't remember name. When asked about psych medication, patient stated "I don't think so". Patient denied having therapist. Patient reported living alone. When asked about guns, patient stated "I got the stuff". Patient continued to fall asleep during assessment and had limited information. Patient provided no collateral contact.   PER EDP NOTE: last night he used meth.  He states he was intoxicated and attempted to inject meth in his left arm.  He states "I could not find a vein, I must of been dehydrated."  He denies actually injecting any meth.  He states that none this morning he did snort meth and he also admits to using cocaine about a week ago.  He states that he used to do these regularly however had been clean for 5 years. He reports that he is on pain medicine for his hip.  He was previously in a homeless shelter and there are people would possibly take his pain medicine from him.  He states  that [place] [employer] to get the rest of his medications and felt like he was being set up.  He thinks that the woman who he was with may have notified some "gang bangers" that he was going to have pain pills as when he went to get the pills he felt like there were people in red shirts who were talking about him and watching him.  He suspects that they were going to "jump" him and take his pills.     Diagnosis: Polysubstance Abuse  Past Medical History:  Past Medical History:  Diagnosis Date  . Diabetes mellitus without complication (HCC)   . Head injury with loss of consciousness (HCC)   . Hypertension     Past Surgical History:  Procedure Laterality Date  . ANKLE FRACTURE SURGERY    . APPENDECTOMY    . NASAL SEPTUM SURGERY    . TONSILLECTOMY    . TOTAL HIP ARTHROPLASTY Right 04/02/2020   Procedure: RIGHT TOTAL HIP ARTHROPLASTY ANTERIOR APPROACH;  Surgeon: Tarry Kos, MD;  Location: [facility];  Service: Orthopedics;  Laterality: Right;    Family History:  Family History  Problem Relation Age of Onset  . Heart disease Mother     Social History:  reports that he has been smoking. He has a 15.00 pack-year smoking history. He has never used smokeless tobacco. He reports previous alcohol use. He reports previous drug use. Drug: Cocaine.  Additional Social History:  Alcohol / Drug Use Pain Medications: see MAR Prescriptions: see MAR Over the Counter: see MAR  CIWA: CIWA-Ar BP: (!) 138/92 Pulse Rate: Marland Kitchen)  103 COWS:    Allergies:  Allergies  Allergen Reactions  . Tramadol     Seizure    Home Medications: (Not in a hospital admission)   OB/GYN Status:  No LMP for male patient.  General Assessment Data Location of Assessment: [facility] TTS Assessment: In system Is this a Tele or Face-to-Face Assessment?: Tele Assessment Is this an Initial Assessment or a Re-assessment for this encounter?: Initial Assessment Patient Accompanied by:: N/A Language Other than English:  No Living Arrangements: (unable to assess) What gender do you identify as?: Male Marital status: Single Living Arrangements: Alone Can pt return to current living arrangement?: Yes Admission Status: Voluntary Is patient capable of signing voluntary admission?: Yes Referral Source: (EMS)  Crisis Care Plan Living Arrangements: Alone Legal Guardian: (self) Name of Psychiatrist: ("I can't think of his name") Name of Therapist: (denied)  Education Status Is patient currently in school?: No Is the patient employed, unemployed or receiving disability?: Employed  Risk to self with the past 6 months Suicidal Ideation: No Has patient been a risk to self within the past 6 months prior to admission? : No Suicidal Intent: No Has patient had any suicidal intent within the past 6 months prior to admission? : No Is patient at risk for suicide?: No Suicidal Plan?: No Has patient had any suicidal plan within the past 6 months prior to admission? : No Access to Means: No What has been your use of drugs/alcohol within the last 12 months?: (cocaine, meth and heroin) Previous Attempts/Gestures: No How many times?: (0) Other Self Harm Risks: (denied) Triggers for Past Attempts: (n/a) Intentional Self Injurious Behavior: None Family Suicide History: Unable to assess Recent stressful life event(s): (drug addiction) Persecutory voices/beliefs?: No Depression: (unable to assess) Depression Symptoms: (unable to assess) Substance abuse history and/or treatment for substance abuse?: No Suicide prevention information given to non-admitted patients: Not applicable  Risk to Others within the past 6 months Homicidal Ideation: No Does patient have any lifetime risk of violence toward others beyond the six months prior to admission? : No Thoughts of Harm to Others: No Current Homicidal Intent: No Current Homicidal Plan: No Access to Homicidal Means: No Identified Victim: (n/a) History of harm to  others?: No Assessment of Violence: None Noted Violent Behavior Description: (none reported) Does patient have access to weapons?: Yes (Comment)(unable to assess "I got the stuff") Criminal Charges Pending?: No Does patient have a court date: No Is patient on probation?: Unknown  Psychosis Hallucinations: Auditory, Visual Delusions: Unspecified  Mental Status Report Appearance/Hygiene: Unremarkable Eye Contact: Poor Motor Activity: Restlessness, Shuffling, Tremors Speech: Pressured, Slurred, Slow Level of Consciousness: Drowsy Mood: Preoccupied Affect: Preoccupied Anxiety Level: Moderate Thought Processes: Unable to Assess Judgement: Impaired Orientation: Person, Time Obsessive Compulsive Thoughts/Behaviors: None  Cognitive Functioning Concentration: Poor Memory: Recent Impaired, Remote Impaired Is patient IDD: No Insight: Poor Impulse Control: Poor Appetite: (unable to assess) Have you had any weight changes? : No Change Sleep: Unable to Assess Total Hours of Sleep: (unknown) Vegetative Symptoms: Unable to Assess  ADLScreening [facility]) Patient's cognitive ability adequate to safely complete daily activities?: Yes Patient able to express need for assistance with ADLs?: Yes Independently performs ADLs?: Yes (appropriate for developmental age)  Prior Inpatient Therapy Prior Inpatient Therapy: (unable to assess)  Prior Outpatient Therapy Prior Outpatient Therapy: Yes Prior Therapy Dates: (present) Prior Therapy Facilty/Provider(s): ("I can't remember my psychiatrist name") Reason for Treatment: (mental illness) Does patient have an ACCT team?: No Does patient have Intensive In-House  Services?  : No Does patient have [facility] services? : No Does patient have P4CC services?: No  ADL Screening (condition at time of admission) Patient's cognitive ability adequate to safely complete daily activities?: Yes Patient able to express need for assistance  with ADLs?: Yes Independently performs ADLs?: Yes (appropriate for developmental age)  Disposition:  Disposition Initial Assessment Completed for this Encounter: Yes  Anike Adaku, NP, recommends overnight observation for safety and stabilization with psych reassessment in the AM.  This service was provided via telemedicine using a 2-way, interactive audio and video technology.  Names of all persons participating in this telemedicine service and their role in this encounter. Name: Krystian Younglove Role: Patient  Name: Al Corpus Role: TTS Clinician  Name:  Role:   Name:  Role:     Burnetta Sabin 04/24/2020 8:30 PM

## 2020-04-24 NOTE — ED Provider Notes (Signed)
[facility] DEPT Provider Note   CSN: [id number] Arrival date & time: 04/24/20  1331     History Chief Complaint  Patient presents with  . Paranoid    Chad Bautista is a 44 y.o. male with past medical history of diabetes, hypertension, status post right total hip replacement on 04/02/2020, who presents today for evaluation of paranoia. He states his hip was fine after surgery until he had the sutures removed and since then he has had some swelling, some dark brown discharge from the wound and mild increased pain.  In addition of this he has redness of his right great toe with a ulcer.  He was seen yesterday by Dr. Sharol Given as a follow-up, started on doxycycline and topical antibiotics with recommendation to recheck in 1 week.  He reports that he did have a dose of doxycycline this morning, however last night he used meth.  He states he was intoxicated and attempted to inject meth in his left arm.  He states "I could not find a vein, I must of been dehydrated."  He denies actually injecting any meth.  He states that none this morning he did snort meth and he also admits to using cocaine about a week ago.  He states that he used to do these regularly however had been clean for 5 years.  He reports that he is on pain medicine for his hip.  He was previously in a homeless shelter and there are people would possibly take his pain medicine from him.  He states that [place] [employer] [place] the [place] of his medications and felt like he was being set up.  He thinks that the woman who he was with may have notified some "gang bangers" that he was going to have pain pills as when he went to get the pills he felt like there were people in red shirts who were talking about him and watching him.  He suspects that they were going to "jump" him and take his pills.    He states that currently the male companion took off with his meds.    He denies SI, HI  or AVH.     HPI       Past Medical History:  Diagnosis Date  . Diabetes mellitus without complication ([place])   . Head injury with loss of consciousness ([place])   . Hypertension     Patient Active Problem List   Diagnosis Date Noted  . 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 Cocaine use 04/24/2020  . Substance abuse ([place]) 04/24/2020  . Uncontrolled type 2 diabetes mellitus with hyperglycemia ([place]) 04/09/2020  . Neuropathy due to type 2 diabetes mellitus ([place]) 04/09/2020  . 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  . Vitamin D deficiency 01/19/2020  . Tobacco use disorder 01/18/2020  . Alcohol use 01/18/2020  . Essential hypertension 01/18/2020  . Moderate episode of recurrent major depressive disorder ([place]) 01/18/2020    Past Surgical History:  Procedure Laterality Date  . ANKLE FRACTURE SURGERY    . APPENDECTOMY    . NASAL SEPTUM SURGERY    . TONSILLECTOMY    . TOTAL HIP ARTHROPLASTY Right 04/02/2020   Procedure: RIGHT TOTAL HIP ARTHROPLASTY ANTERIOR APPROACH;  Surgeon: Leandrew Koyanagi, MD;  Location: [facility];  Service: Orthopedics;  Laterality: Right;       Family History  Problem Relation Age of Onset  . Heart disease Mother     Social History  Tobacco Use  . Smoking status: Current Every Day Smoker    Packs/day: 0.50    Years: 30.00    Pack years: 15.00  . Smokeless tobacco: Never Used  Substance Use Topics  . Alcohol use: Not Currently    Comment: rare- beer  . Drug use: Not Currently    Types: Cocaine    Comment: hx of cocaine use, states that he has been clean five years    Home Medications Prior to Admission medications   Medication Sig Start Date End Date Taking? Authorizing Provider  doxycycline (VIBRA-TABS) 100 MG tablet Take 1 tablet (100 mg total) by mouth 2 (two) times daily. 04/23/20  Yes Newt Minion, MD  gabapentin (NEURONTIN) 300 MG capsule Take 2 capsules (600 mg total) by mouth 3 (three) times daily. 04/09/20 05/09/20 Yes Elsie Stain, MD   metFORMIN (GLUCOPHAGE) 500 MG tablet Take 1 tablet (500 mg total) by mouth 2 (two) times daily with a meal. 04/09/20  Yes Elsie Stain, MD  mupirocin ointment (BACTROBAN) 2 % Apply 1 application topically 2 (two) times daily. Apply to the affected area 2 times a day 04/23/20  Yes Newt Minion, MD  oxyCODONE-acetaminophen (PERCOCET) 10-325 MG tablet Take 1 tablet by mouth every 8 (eight) hours as needed for up to 5 days for pain. 04/23/20 04/28/20 Yes Elsie Stain, MD  Vitamin D, Ergocalciferol, (DRISDOL) 1.25 MG (50000 UNIT) CAPS capsule Take 1 capsule (50,000 Units total) by mouth every 7 (seven) days. 04/09/20  Yes Elsie Stain, MD  aspirin 81 MG chewable tablet Chew 1 tablet (81 mg total) by mouth 2 (two) times daily. Patient not taking: Reported on 04/24/2020 04/03/20   Aundra Dubin, PA-C  Blood Glucose Monitoring Suppl (TRUE METRIX METER) w/Device KIT Use to measure blood sugar twice a day 04/09/20   Elsie Stain, MD  docusate sodium (COLACE) 100 MG capsule Take 1 capsule (100 mg total) by mouth 2 (two) times daily. Patient not taking: Reported on 04/24/2020 04/03/20   Aundra Dubin, PA-C  glucose blood (TRUE METRIX BLOOD GLUCOSE TEST) test strip Use as instructed 04/09/20   Elsie Stain, MD  losartan-hydrochlorothiazide [facility]) 100-25 MG tablet HOLD for NOW 04/09/20   Elsie Stain, MD  methocarbamol (ROBAXIN) 750 MG tablet Take 1 tablet (750 mg total) by mouth every 6 (six) hours as needed for muscle spasms. Patient not taking: Reported on 04/24/2020 04/03/20   Aundra Dubin, PA-C  ondansetron (ZOFRAN) 4 MG tablet Take 1 tablet (4 mg total) by mouth every 6 (six) hours as needed for nausea. Patient not taking: Reported on 04/24/2020 04/03/20   Aundra Dubin, PA-C  oxyCODONE (OXY IR/ROXICODONE) 5 MG immediate release tablet Take 1-2 tablets (5-10 mg total) by mouth every 6 (six) hours as needed for moderate pain (pain score 4-6). Patient not taking: Reported on  04/24/2020 04/03/20   Aundra Dubin, PA-C  TRUEplus Lancets 28G MISC Use to measure blood sugar twice a day 04/09/20   Elsie Stain, MD    Allergies    Tramadol  Review of Systems   Review of Systems  Constitutional: Negative for chills and fever.  Respiratory: Negative for chest tightness and shortness of breath.   Cardiovascular: Negative for chest pain.  Gastrointestinal: Negative for abdominal pain.  Genitourinary: Negative for dysuria.  Musculoskeletal: Negative for back pain.       Pain in right hip and right great toe.   Skin: Positive for  color change and wound.  Neurological: Negative for weakness and headaches.  Psychiatric/Behavioral: Positive for behavioral problems and sleep disturbance ("I haven't slept for a few days"). Negative for self-injury. The patient is nervous/anxious.   All other systems reviewed and are negative.   Physical Exam Updated Vital Signs BP (!) 138/92 (BP Location: Left Arm)   Pulse (!) 103   Temp 98.2 F (36.8 C) (Oral)   Resp 18   SpO2 98%   Physical Exam Vitals and nursing note reviewed. Exam conducted with a chaperone present.  Constitutional:      Appearance: He is well-developed.  HENT:     Head: Normocephalic and atraumatic.     Mouth/Throat:     Mouth: Mucous membranes are moist.  Eyes:     Conjunctiva/sclera: Conjunctivae normal.  Cardiovascular:     Rate and Rhythm: Regular rhythm. Tachycardia present.     Heart sounds: No murmur.  Pulmonary:     Effort: Pulmonary effort is normal. No respiratory distress.     Breath sounds: Normal breath sounds.  Abdominal:     Palpations: Abdomen is soft.     Tenderness: There is no abdominal tenderness.  Musculoskeletal:     Cervical back: Neck supple.     Right lower leg: No edema.     Left lower leg: No edema.  Skin:    General: Skin is warm and dry.     Comments: Please see clinical images.  There are 2 small areas of minimal dehiscence, around the wound on the right hip.   There is no significant drainage.  There is no abnormal fluctuance or significant induration.  The right great toe is generally erythematous with a debrided wound present on the inferior medial aspect without active drainage.  There is no proximal red streaking from either wound.  There are multiple areas of ecchymosis on the left upper arm where patient reports he had been looking for a vein to inject.  Neurological:     General: No focal deficit present.     Mental Status: He is alert.     Cranial Nerves: No cranial nerve deficit.     Motor: No weakness.  Psychiatric:        Attention and Perception: Attention and perception normal.        Mood and Affect: Affect is labile.        Speech: Speech is rapid and pressured.        Behavior: Behavior is cooperative.        Thought Content: Thought content is paranoid. Thought content is not delusional. Thought content does not include homicidal or suicidal ideation. Thought content does not include homicidal or suicidal plan.        Judgment: Judgment is impulsive.     Comments: He does not appear to be responding to internal stimuli.         ED Results / Procedures / Treatments   Labs (all labs ordered are listed, but only abnormal results are displayed) Labs Reviewed  COMPREHENSIVE METABOLIC PANEL - Abnormal; Notable for the following components:      Result Value   CO2 21 (*)    Glucose, Bld 107 (*)    Calcium 8.7 (*)    AST 76 (*)    ALT 47 (*)    All other components within normal limits  RAPID URINE DRUG SCREEN, HOSP PERFORMED - Abnormal; Notable for the following components:   Opiates POSITIVE (*)    Cocaine POSITIVE (*)  Amphetamines POSITIVE (*)    All other components within normal limits  CBC WITH DIFFERENTIAL/PLATELET - Abnormal; Notable for the following components:   RBC 3.34 (*)    Hemoglobin 9.9 (*)    HCT 30.3 (*)    All other components within normal limits  SALICYLATE LEVEL - Abnormal; Notable for the  following components:   Salicylate Lvl <6.5 (*)    All other components within normal limits  ACETAMINOPHEN LEVEL - Abnormal; Notable for the following components:   Acetaminophen (Tylenol), Serum <10 (*)    All other components within normal limits  SARS CORONAVIRUS 2 BY RT PCR (HOSPITAL ORDER, [facility])  ETHANOL    EKG None  Radiology No results found.  Procedures Procedures (including critical care time)  Medications Ordered in ED Medications  ibuprofen (ADVIL) tablet 600 mg (has no administration in time range)  doxycycline (VIBRA-TABS) tablet 100 mg (has no administration in time range)  gabapentin (NEURONTIN) capsule 600 mg (600 mg Oral Given 04/24/20 1811)  metFORMIN (GLUCOPHAGE) tablet 500 mg (500 mg Oral Given 04/24/20 1811)  mupirocin ointment (BACTROBAN) 2 % 1 application (has no administration in time range)  docusate sodium (COLACE) capsule 100 mg (has no administration in time range)  oxyCODONE-acetaminophen (PERCOCET/ROXICET) 5-325 MG per tablet 1 tablet (has no administration in time range)    And  oxyCODONE (Oxy IR/ROXICODONE) immediate release tablet 5 mg (has no administration in time range)    ED Course  I have reviewed the triage vital signs and the nursing notes.  Pertinent labs & imaging results that were available during my care of the patient were reviewed by me and considered in my medical decision making (see chart for details).    MDM Rules/Calculators/A&P                     Patient is a 44 year old man who presents today for evaluation of paranoia.  He reports that he recently used both cocaine and amphetamines.  He did attempt to inject however states he was unable to find the pain.  He expresses significant concerns when he went to get his medicines today that [place] [employer] and there were a lot of people with red shirts watching him, he is concerned that they were there to steal his pills and that he was set up by a  woman who he lives with.  He recently underwent hip replacement, he was seen yesterday by Dr. Sharol Given for concerned of wound infection and infection in the right great toe.  He was started on doxycycline.  He took his dose this morning.  His doxycycline, topical antibiotic, and pain medicine (both ibuprofen and Percocet) are reordered.  His home medications are reordered in addition.    Given that he was seen by orthopedics yesterday who recommended outpatient treatment with follow-up in approximately 1 week no indication for changing his treatment at this time.    He is slightly tachycardic, however that is most likely related to his amphetamine use.  He does not have a significant leukocytosis.  Covid test is negative.  Salicylate and acetaminophen levels are undetected.  UDS is positive for opioids(for which he has valid prescriptions), cocaine, and amphetamines.  Patient is medically clear for psychiatric evaluation and disposition.  Note: Portions of this report may have been transcribed using voice recognition software. Every effort was made to ensure accuracy; however, inadvertent computerized transcription errors may be present  Final Clinical Impression(s) /  ED Diagnoses Final diagnoses:  Paranoia (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

## 2020-04-24 NOTE — ED Notes (Signed)
Anike Adaku, NP, recommends overnight observation for safety and stabilization with psych reassessment in the AM.  

## 2020-04-24 NOTE — Telephone Encounter (Signed)
Per Dr. Lajoyce Corners. Rx (oxy) ready for pick up at the front desk. Autumn tried calling no answer. I called today no answer LMOM. Pick up Rx here in our office.

## 2020-04-24 NOTE — ED Notes (Signed)
Pt resting in bed. Pt making noise, talking to self.

## 2020-04-25 ENCOUNTER — Telehealth: Payer: Self-pay

## 2020-04-25 NOTE — ED Provider Notes (Signed)
Emergency Medicine Observation Re-evaluation Note  Chad Bautista is a 44 y.o. male, seen on rounds today.  Pt initially presented to the ED for complaints of Paranoid Currently, the patient is awaiting psychiatric reassessment.  Physical Exam  BP 133/82 (BP Location: Left Arm)   Pulse 85   Temp 98.2 F (36.8 C) (Oral)   Resp 18   SpO2 97%  Physical Exam  ED Course / MDM  EKG:    I have reviewed the labs performed to date as well as medications administered while in observation.  Recent changes in the last 24 hours include none. Plan  Current plan is for psych reassessment. Patient is not under full IVC at this time.   Lorre Nick, MD 04/25/20 [phone]

## 2020-04-25 NOTE — Telephone Encounter (Signed)
[place] APPROVED THRU 10/22/2020 FOR OXYCODONE/APAP

## 2020-04-25 NOTE — Consult Note (Signed)
[facility] Psych ED Discharge  04/25/2020 11:31 AM Chad Bautista  MRN:  [id number] Principal Problem: <principal problem not specified> Discharge Diagnoses: Active Problems:   Methamphetamine use (HCC)   Cocaine use   Substance abuse ([place])   Subjective: " I have not been sleeping before coming to the hospital"  Assessment:  Chad Bautista, 44 y.o., male patient seen via tele psych by this provider, Dr. Dwyane Dee and chart reviewed on 04/25/20.  On evaluation Chad Bautista reports he slept much better last night.  He reported that for few days before coming to the ER he was up many days.  He admitted to using MetHamphetamine before coming to the hospital.  He is supposed to be on his antibiotics and pain medications after Hip replacement which he did not bring to the hospital.  During evaluation Chad Bautista is alert/oriented x 4; calm/cooperative; and mood is congruent with affect.  He does not appear to be responding to internal/external stimuli or delusional thoughts.  Patient denies suicidal/self-harm/homicidal ideation, psychosis, and paranoia.  Patient answered question appropriately and willingly.     Total Time spent with patient: 30 minutes  Past Psychiatric History: Moderate Episode of Recurrent major Depressive disorder, Alcohol use disorder, Tobacco use disorder.  Past Medical History:  Past Medical History:  Diagnosis Date  . Diabetes mellitus without complication ([place])   . Head injury with loss of consciousness ([place])   . Hypertension     Past Surgical History:  Procedure Laterality Date  . ANKLE FRACTURE SURGERY    . APPENDECTOMY    . NASAL SEPTUM SURGERY    . TONSILLECTOMY    . TOTAL HIP ARTHROPLASTY Right 04/02/2020   Procedure: RIGHT TOTAL HIP ARTHROPLASTY ANTERIOR APPROACH;  Surgeon: Leandrew Koyanagi, MD;  Location: [facility];  Service: Orthopedics;  Laterality: Right;   Family History:  Family History  Problem Relation Age of Onset  . Heart disease Mother    Family  Psychiatric  History: uta Social History:  Social History   Substance and Sexual Activity  Alcohol Use Not Currently   Comment: rare- beer     Social History   Substance and Sexual Activity  Drug Use Not Currently  . Types: Cocaine   Comment: hx of cocaine use, states that he has been clean five years    Social History   Socioeconomic History  . Marital status: Single    Spouse name: Not on file  . Number of children: Not on file  . Years of education: Not on file  . Highest education level: Not on file  Occupational History  . Not on file  Tobacco Use  . Smoking status: Current Every Day Smoker    Packs/day: 0.50    Years: 30.00    Pack years: 15.00  . Smokeless tobacco: Never Used  Substance and Sexual Activity  . Alcohol use: Not Currently    Comment: rare- beer  . Drug use: Not Currently    Types: Cocaine    Comment: hx of cocaine use, states that he has been clean five years  . Sexual activity: Not Currently  Other Topics Concern  . Not on file  Social History Narrative  . Not on file   Social Determinants of Health   Financial Resource Strain:   . Difficulty of Paying Living Expenses:   Food Insecurity:   . Worried About Charity fundraiser in the Last Year:   . Arboriculturist in the Last Year:   Transportation Needs:   .  Lack of Transportation (Medical):   Marland Kitchen Lack of Transportation (Non-Medical):   Physical Activity:   . Days of Exercise per Week:   . Minutes of Exercise per Session:   Stress:   . Feeling of Stress :   Social Connections:   . Frequency of Communication with Friends and Family:   . Frequency of Social Gatherings with Friends and Family:   . Attends Religious Services:   . Active Member of Clubs or Organizations:   . Attends Archivist Meetings:   Marland Kitchen Marital Status:     Has this patient used any form of tobacco in the last 30 days? (Cigarettes, Smokeless Tobacco, Cigars, and/or Pipes) A prescription for an FDA-approved  tobacco cessation medication was offered at discharge and the patient refused  Current Medications: Current Facility-Administered Medications  Medication Dose Route Frequency Provider Last Rate Last Admin  . docusate sodium (COLACE) capsule 100 mg  100 mg Oral BID Lorin Glass, PA-C   100 mg at 04/25/20 9563  . doxycycline (VIBRA-TABS) tablet 100 mg  100 mg Oral BID Lorin Glass, PA-C   100 mg at 04/25/20 8756  . gabapentin (NEURONTIN) capsule 600 mg  600 mg Oral TID Lorin Glass, PA-C   600 mg at 04/25/20 0943  . ibuprofen (ADVIL) tablet 600 mg  600 mg Oral Q8H PRN Lorin Glass, PA-C      . metFORMIN (GLUCOPHAGE) tablet 500 mg  500 mg Oral BID WC Lorin Glass, PA-C   500 mg at 04/25/20 0943  . mupirocin ointment (BACTROBAN) 2 % 1 application  1 application Topical BID Lorin Glass, PA-C      . oxyCODONE-acetaminophen (PERCOCET/ROXICET) 5-325 MG per tablet 1 tablet  1 tablet Oral Q8H PRN Wofford, Cindie Laroche, RPH       And  . oxyCODONE (Oxy IR/ROXICODONE) immediate release tablet 5 mg  5 mg Oral Q8H PRN Polly Cobia, [facility]       Current Outpatient Medications  Medication Sig Dispense Refill  . doxycycline (VIBRA-TABS) 100 MG tablet Take 1 tablet (100 mg total) by mouth 2 (two) times daily. 20 tablet 0  . gabapentin (NEURONTIN) 300 MG capsule Take 2 capsules (600 mg total) by mouth 3 (three) times daily. 180 capsule 1  . metFORMIN (GLUCOPHAGE) 500 MG tablet Take 1 tablet (500 mg total) by mouth 2 (two) times daily with a meal. 180 tablet 3  . mupirocin ointment (BACTROBAN) 2 % Apply 1 application topically 2 (two) times daily. Apply to the affected area 2 times a day 22 g 3  . oxyCODONE-acetaminophen (PERCOCET) 10-325 MG tablet Take 1 tablet by mouth every 8 (eight) hours as needed for up to 5 days for pain. 20 tablet 0  . Vitamin D, Ergocalciferol, (DRISDOL) 1.25 MG (50000 UNIT) CAPS capsule Take 1 capsule (50,000 Units total) by mouth every 7 (seven)  days. 5 capsule 5  . aspirin 81 MG chewable tablet Chew 1 tablet (81 mg total) by mouth 2 (two) times daily. (Patient not taking: Reported on 04/24/2020) 84 tablet 0  . Blood Glucose Monitoring Suppl (TRUE METRIX METER) w/Device KIT Use to measure blood sugar twice a day 1 kit 0  . docusate sodium (COLACE) 100 MG capsule Take 1 capsule (100 mg total) by mouth 2 (two) times daily. (Patient not taking: Reported on 04/24/2020) 10 capsule 0  . glucose blood (TRUE METRIX BLOOD GLUCOSE TEST) test strip Use as instructed 100 each 12  . losartan-hydrochlorothiazide (HYZAAR)  100-25 MG tablet HOLD for NOW 30 tablet 1  . ondansetron (ZOFRAN) 4 MG tablet Take 1 tablet (4 mg total) by mouth every 6 (six) hours as needed for nausea. (Patient not taking: Reported on 04/24/2020) 20 tablet 0  . oxyCODONE (OXY IR/ROXICODONE) 5 MG immediate release tablet Take 1-2 tablets (5-10 mg total) by mouth every 6 (six) hours as needed for moderate pain (pain score 4-6). (Patient not taking: Reported on 04/24/2020) 30 tablet 0  . TRUEplus Lancets 28G MISC Use to measure blood sugar twice a day 100 each 1   PTA Medications: (Not in a hospital admission)   Musculoskeletal: Strength & Muscle Tone: within normal limits Gait & Station: normal Patient leans: N/A  Psychiatric Specialty Exam: Physical Exam Vitals and nursing note reviewed. Exam conducted with a chaperone present (sitter at bedside).  Constitutional:      Appearance: Normal appearance.  Pulmonary:     Effort: Pulmonary effort is normal.  Neurological:     Mental Status: He is alert.  Psychiatric:        Attention and Perception: Attention and perception normal.        Mood and Affect: Mood normal.        Speech: Speech normal.        Behavior: Behavior normal. Behavior is cooperative.        Thought Content: Thought content normal. Thought content is not paranoid or delusional. Thought content does not include homicidal or suicidal ideation.         Cognition and Memory: Cognition and memory normal.        Judgment: Judgment normal.     Review of Systems  Psychiatric/Behavioral: Negative for confusion, hallucinations, self-injury, sleep disturbance and suicidal ideas.       Patient reported he had been doing meth for a couple of days and had not slept; but is feeling better after sleeping  All other systems reviewed and are negative.   Blood pressure 133/82, pulse 85, temperature 98.2 F (36.8 C), temperature source Oral, resp. rate 18, SpO2 97 %.There is no height or weight on file to calculate BMI.  General Appearance: Fairly Groomed  Eye Contact:  Good  Speech:  Normal Rate and clear  Volume:  Normal  Mood:  NA  Affect:  Congruent  Thought Process:  Coherent  Orientation:  Full (Time, Place, and Person)  Thought Content:  NA  Suicidal Thoughts:  No  Homicidal Thoughts:  No  Memory:  intact  Judgement:  Good  Insight:  Good  Psychomotor Activity:  Normal  Concentration:  Concentration: Good  Recall:  Good  Fund of Knowledge:  Good  Language:  Good  Akathisia:  NA  Handed:  Right  AIMS (if indicated):     Assets:  Communication Skills Desire for Improvement  ADL's:  Intact  Cognition:  WNL  Sleep:        Demographic Factors:  Male  Loss Factors: NA  Historical Factors: Impulsivity  Risk Reduction Factors:   Positive social support  Continued Clinical Symptoms:  Alcohol/Substance Abuse/Dependencies  Cognitive Features That Contribute To Risk:  None    Suicide Risk:  Minimal: No identifiable suicidal ideation.  Patients presenting with no risk factors but with morbid ruminations; may be classified as minimal risk based on the severity of the depressive symptoms  Follow-up Information    [facility]. Schedule an appointment as soon as possible for a visit in 1 week(s).   Specialty: Behavioral Health Why:  for discharge follow up  Contact information: [address]  [place] [address] [phone]           Plan Of Care/Follow-up recommendations:  Activity:  as tolerated Diet:  regular Tests:  na Other:  na    Disposition:  Psychiatrically cleared No evidence of imminent risk to self or others at present.   Patient does not meet criteria for psychiatric inpatient admission. Supportive therapy provided about ongoing stressors. Discussed crisis plan, support from social network, calling 911, coming to the Emergency Department, and calling Suicide Hotline.  Shuvon Rankin, NP 04/25/2020, 11:31 AM

## 2020-04-25 NOTE — Progress Notes (Signed)
Received Chad Bautista last PM asleep in his room. He is restless at intervals and talking in his sleep. He was compliant with his medications. He continued to sleep throughout the night.

## 2020-04-25 NOTE — ED Notes (Signed)
Pt DC off unit to home per provider. Pt alert, cooperative, no s/s of distress. DC information and resources  given to pt and reviewed to pt, pt acknowledged understanding.  Pt ambulatory off unit, escorted by NT. Pt using bus, per pt.

## 2020-04-30 ENCOUNTER — Ambulatory Visit: Payer: Medicaid Other | Admitting: Orthopedic Surgery

## 2020-05-02 ENCOUNTER — Ambulatory Visit: Payer: Medicaid Other | Admitting: Critical Care Medicine

## 2020-05-02 NOTE — Progress Notes (Deleted)
Subjective:    Patient ID: Chad Bautista, male    DOB: 09/02/1976, 44 y.o.   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 The patient currently is homeless and just recently was released from the criminal justice system in prison.  He was in the justice system due to multiple DUIs and trying to allude a police chase while intoxicated.  Patient does have a lifelong history of stress and anxiety and grew up in a home in which his father left him in early age she does not communicate with the father.  The patient currently is at the [facility] homeless shelter but is working on a potential apartment within the next 30 days.  Patient does smoke a half a pack a day of cigarettes.  He states he drinks about occasional can of beer 2 or 3 days a week.  When I met the patient the homeless shelter we initiated losartan HCT for hypertension and did obtain for the patient orthopedic appointment.  He has the upcoming appointment on February 8 for orthopedics.  Pain has been quite severe and has been managing this with meloxicam daily and I did give him a very short course of opioids to use only as needed.  He has not been overusing these opioids and [employer] showed no other prescribers.  The patient has no other medical concerns at this visit.  He is here for additional blood work and to establish a primary care   04/09/2020 Since last OV has had R THR. Overall the patient's pain is markedly improved and is able to ambulate without any assistance.  Patient is now in an independent apartment and has furniture as well.  He only notes some swelling in the thigh near the incision site.  He no longer is drinking alcohol.  He is weaning himself off the oxycodone  He has an appointment in a week to have his  sutures removed The patient does have a history of diabetes and note had a hemoglobin A1c of 6.8 in the hospital with blood sugars in the 120-150 range.  The patient had lost significant weight and had been on insulin in the past  05/02/2020 Essential hypertension Hypertension with low blood pressure at this time and note in the hospital had elevated potassium level on April 21  Plan will be to hold losartan HCT for now and reevaluate in follow-up with a basic metabolic panel next visit  Moderate episode of recurrent major depressive disorder ([place]) Depression seems to be markedly improved at this time  Alcohol use Alcohol use currently is decreased  Status post total replacement of right hip Status post total hip replacement  Per orthopedics  Tobacco use disorder Patient counseled as to tobacco use  Vitamin D deficiency Patient's vitamin D prescription was refilled  Uncontrolled type 2 diabetes mellitus with hyperglycemia (HCC) Elevated hemoglobin A1c with elevated blood glucoses in the hospital  Diabetic diet was reviewed with the patient  Begin Metformin 500 mg twice daily and a glucometer was issued to the patient for checking blood sugars daily  Gabapentin was refilled for neuropathy in the feet  Neuropathy due to type 2 diabetes mellitus ([place]) Have issued a gabapentin prescription for neuropathy in the feet   Past Medical History:  Diagnosis Date  . Diabetes mellitus without  complication ([place])   . Head injury with loss of consciousness ([place])   . Hypertension      Family History  Problem Relation Age of Onset  . Heart disease Mother      Social History   Socioeconomic History  . Marital status: Single    Spouse name: Not on file  . Number of children: Not on file  . Years of education: Not on file  . Highest education level: Not on file  Occupational History  . Not on file  Tobacco Use  . Smoking status: Current Every Day Smoker     Packs/day: 0.50    Years: 30.00    Pack years: 15.00  . Smokeless tobacco: Never Used  Substance and Sexual Activity  . Alcohol use: Not Currently    Comment: rare- beer  . Drug use: Not Currently    Types: Cocaine    Comment: hx of cocaine use, states that he has been clean five years  . Sexual activity: Not Currently  Other Topics Concern  . Not on file  Social History Narrative  . Not on file   Social Determinants of Health   Financial Resource Strain:   . Difficulty of Paying Living Expenses:   Food Insecurity:   . Worried About Charity fundraiser in the Last Year:   . Arboriculturist in the Last Year:   Transportation Needs:   . Film/video editor (Medical):   Marland Kitchen Lack of Transportation (Non-Medical):   Physical Activity:   . Days of Exercise per Week:   . Minutes of Exercise per Session:   Stress:   . Feeling of Stress :   Social Connections:   . Frequency of Communication with Friends and Family:   . Frequency of Social Gatherings with Friends and Family:   . Attends Religious Services:   . Active Member of Clubs or Organizations:   . Attends Archivist Meetings:   Marland Kitchen Marital Status:   Intimate Partner Violence:   . Fear of Current or Ex-Partner:   . Emotionally Abused:   Marland Kitchen Physically Abused:   . Sexually Abused:      Allergies  Allergen Reactions  . Tramadol     Seizure     Outpatient Medications Prior to Visit  Medication Sig Dispense Refill  . aspirin 81 MG chewable tablet Chew 1 tablet (81 mg total) by mouth 2 (two) times daily. (Patient not taking: Reported on 04/24/2020) 84 tablet 0  . Blood Glucose Monitoring Suppl (TRUE METRIX METER) w/Device KIT Use to measure blood sugar twice a day 1 kit 0  . docusate sodium (COLACE) 100 MG capsule Take 1 capsule (100 mg total) by mouth 2 (two) times daily. (Patient not taking: Reported on 04/24/2020) 10 capsule 0  . doxycycline (VIBRA-TABS) 100 MG tablet Take 1 tablet (100 mg total) by mouth 2 (two)  times daily. 20 tablet 0  . gabapentin (NEURONTIN) 300 MG capsule Take 2 capsules (600 mg total) by mouth 3 (three) times daily. 180 capsule 1  . glucose blood (TRUE METRIX BLOOD GLUCOSE TEST) test strip Use as instructed 100 each 12  . losartan-hydrochlorothiazide (HYZAAR) 100-25 MG tablet HOLD for NOW 30 tablet 1  . metFORMIN (GLUCOPHAGE) 500 MG tablet Take 1 tablet (500 mg total) by mouth 2 (two) times daily with a meal. 180 tablet 3  . mupirocin ointment (BACTROBAN) 2 % Apply 1 application topically 2 (two) times daily. Apply to the affected area 2 times  a day 22 g 3  . ondansetron (ZOFRAN) 4 MG tablet Take 1 tablet (4 mg total) by mouth every 6 (six) hours as needed for nausea. (Patient not taking: Reported on 04/24/2020) 20 tablet 0  . oxyCODONE (OXY IR/ROXICODONE) 5 MG immediate release tablet Take 1-2 tablets (5-10 mg total) by mouth every 6 (six) hours as needed for moderate pain (pain score 4-6). (Patient not taking: Reported on 04/24/2020) 30 tablet 0  . TRUEplus Lancets 28G MISC Use to measure blood sugar twice a day 100 each 1  . Vitamin D, Ergocalciferol, (DRISDOL) 1.25 MG (50000 UNIT) CAPS capsule Take 1 capsule (50,000 Units total) by mouth every 7 (seven) days. 5 capsule 5   No facility-administered medications prior to visit.      Review of Systems  Constitutional: Positive for fatigue. Negative for fever.  HENT: Negative.   Respiratory: Negative for cough, shortness of breath and wheezing.   Cardiovascular: Positive for leg swelling. Negative for palpitations.  Gastrointestinal: Negative.   Genitourinary: Negative.   Musculoskeletal:       R hip pain and shoulder pain   Neurological: Negative for dizziness and weakness.  Psychiatric/Behavioral: Positive for dysphoric mood and sleep disturbance. Negative for behavioral problems, decreased concentration and self-injury. The patient is nervous/anxious.        Objective:   Physical Exam  There were no vitals filed for  this visit.  Gen: Pleasant, well-nourished, in no distress,  normal affect  ENT: No lesions,  mouth clear,  oropharynx clear, no postnasal drip  Neck: No JVD, no TMG, no carotid bruits  Lungs: No use of accessory muscles, no dullness to percussion, clear without rales or rhonchi  Cardiovascular: RRR, heart sounds normal, no murmur or gallops, no peripheral edema  Abdomen: soft and NT, no HSM,  BS normal  Musculoskeletal: No deformities, no cyanosis or  Incision site in the right anterior thigh area looks good and the wound is healing dressing was changed There is no evidence of infection at the site of the wound The patient's feet were examined there is some toenail fungus and the site of the wound in the left foot and the plantar aspect is healing  Neuro: alert, non focal  Skin: Warm, no lesions or rashes      Assessment & Plan:  I personally reviewed all images and lab data in the [facility] system as well as any outside material available during this office visit and agree with the  radiology impressions.   No problem-specific Assessment & Plan notes found for this encounter.   There are no diagnoses linked to this encounter.The patient was counseled to obtain a Covid vaccine will hold off on the tetanus vaccine until the Covid vaccine is obtained

## 2020-05-09 ENCOUNTER — Ambulatory Visit: Payer: Medicaid Other | Admitting: Critical Care Medicine

## 2020-05-11 MED FILL — METFORMIN HCL 500 MG TABS: 500 | 30 days supply | Qty: 60 | Fill #1

## 2020-05-11 MED FILL — ?ERGOCALCIFEROL 50000 UNITC: 1.25 MG | 35 days supply | Qty: 5 | Fill #1

## 2020-05-11 MED FILL — GABAPENTIN 300 MG CAPSULE: 300 | 30 days supply | Qty: 180 | Fill #1

## 2020-05-15 ENCOUNTER — Ambulatory Visit: Payer: Medicaid Other | Admitting: Orthopaedic Surgery

## 2020-05-24 ENCOUNTER — Encounter: Payer: Self-pay | Admitting: Critical Care Medicine

## 2020-05-24 ENCOUNTER — Ambulatory Visit: Payer: Medicaid Other | Attending: Critical Care Medicine | Admitting: Critical Care Medicine

## 2020-05-24 ENCOUNTER — Other Ambulatory Visit: Payer: Self-pay

## 2020-05-24 VITALS — BP 184/117 | HR 97 | Temp 96.6°F | Resp 16 | Wt 213.8 lb

## 2020-05-24 DIAGNOSIS — F1510 Other stimulant abuse, uncomplicated: Secondary | ICD-10-CM

## 2020-05-24 DIAGNOSIS — F17200 Nicotine dependence, unspecified, uncomplicated: Secondary | ICD-10-CM

## 2020-05-24 DIAGNOSIS — Z96641 Presence of right artificial hip joint: Secondary | ICD-10-CM

## 2020-05-24 DIAGNOSIS — E559 Vitamin D deficiency, unspecified: Secondary | ICD-10-CM

## 2020-05-24 DIAGNOSIS — E1165 Type 2 diabetes mellitus with hyperglycemia: Secondary | ICD-10-CM

## 2020-05-24 DIAGNOSIS — E1140 Type 2 diabetes mellitus with diabetic neuropathy, unspecified: Secondary | ICD-10-CM

## 2020-05-24 DIAGNOSIS — F331 Major depressive disorder, recurrent, moderate: Secondary | ICD-10-CM

## 2020-05-24 DIAGNOSIS — M25561 Pain in right knee: Secondary | ICD-10-CM

## 2020-05-24 DIAGNOSIS — F1910 Other psychoactive substance abuse, uncomplicated: Secondary | ICD-10-CM

## 2020-05-24 DIAGNOSIS — G8929 Other chronic pain: Secondary | ICD-10-CM

## 2020-05-24 DIAGNOSIS — I10 Essential (primary) hypertension: Secondary | ICD-10-CM

## 2020-05-24 LAB — GLUCOSE, POCT (MANUAL RESULT ENTRY): POC Glucose: 143 mg/dl — AB (ref 70–99)

## 2020-05-24 MED ORDER — PREDNISONE 10 MG PO TABS
ORAL_TABLET | ORAL | 0 refills | Status: DC
Start: 2020-05-24 — End: 2020-06-12

## 2020-05-24 MED ORDER — LOSARTAN POTASSIUM-HCTZ 100-25 MG PO TABS: ORAL_TABLET | ORAL | 1 refills | Status: DC

## 2020-05-24 MED ORDER — DICLOFENAC SODIUM 1 % EX GEL: 2.0000 g | Freq: Four times a day (QID) | CUTANEOUS | 1 refills | Status: DC

## 2020-05-24 MED ORDER — MELOXICAM 15 MG PO TABS
15.0000 mg | ORAL_TABLET | Freq: Every day | ORAL | 0 refills | Status: DC
Start: 2020-05-24 — End: 2020-07-16

## 2020-05-24 NOTE — Patient Instructions (Signed)
Start meloxicam one daily for knee pain Start voltaren gel 4 x daily topical to right knee Obtain xray of knee today No change in metformin Resume losartan HCT Return dr Delford Field 1 month  Do knee exercises below   Journal for Nurse Practitioners, 15(4), [phone]. Retrieved September 20, 2018 from http://clinicalkey.com/nursing">  Knee Exercises Ask your health care provider which exercises are safe for you. Do exercises exactly as told by your health care provider and adjust them as directed. It is normal to feel mild stretching, pulling, tightness, or discomfort as you do these exercises. Stop right away if you feel sudden pain or your pain gets worse. Do not begin these exercises until told by your health care provider. Stretching and range-of-motion exercises These exercises warm up your muscles and joints and improve the movement and flexibility of your knee. These exercises also help to relieve pain and swelling. Knee extension, prone 1. Lie on your abdomen (prone position) on a bed. 2. Place your left / right knee just beyond the edge of the surface so your knee is not on the bed. You can put a towel under your left / right thigh just above your kneecap for comfort. 3. Relax your leg muscles and allow gravity to straighten your knee (extension). You should feel a stretch behind your left / right knee. 4. Hold this position for __________ seconds. 5. Scoot up so your knee is supported between repetitions. Repeat __________ times. Complete this exercise __________ times a day. Knee flexion, active  1. Lie on your back with both legs straight. If this causes back discomfort, bend your left / right knee so your foot is flat on the floor. 2. Slowly slide your left / right heel back toward your buttocks. Stop when you feel a gentle stretch in the front of your knee or thigh (flexion). 3. Hold this position for __________ seconds. 4. Slowly slide your left / right heel back to the starting  position. Repeat __________ times. Complete this exercise __________ times a day. Quadriceps stretch, prone  1. Lie on your abdomen on a firm surface, such as a bed or padded floor. 2. Bend your left / right knee and hold your ankle. If you cannot reach your ankle or pant leg, loop a belt around your foot and grab the belt instead. 3. Gently pull your heel toward your buttocks. Your knee should not slide out to the side. You should feel a stretch in the front of your thigh and knee (quadriceps). 4. Hold this position for __________ seconds. Repeat __________ times. Complete this exercise __________ times a day. Hamstring, supine 1. Lie on your back (supine position). 2. Loop a belt or towel over the ball of your left / right foot. The ball of your foot is on the walking surface, right under your toes. 3. Straighten your left / right knee and slowly pull on the belt to raise your leg until you feel a gentle stretch behind your knee (hamstring). ? Do not let your knee bend while you do this. ? Keep your other leg flat on the floor. 4. Hold this position for __________ seconds. Repeat __________ times. Complete this exercise __________ times a day. Strengthening exercises These exercises build strength and endurance in your knee. Endurance is the ability to use your muscles for a long time, even after they get tired. Quadriceps, isometric This exercise stretches the muscles in front of your thigh (quadriceps) without moving your knee joint (isometric). 1. Lie on your back  with your left / right leg extended and your other knee bent. Put a rolled towel or small pillow under your knee if told by your health care provider. 2. Slowly tense the muscles in the front of your left / right thigh. You should see your kneecap slide up toward your hip or see increased dimpling just above the knee. This motion will push the back of the knee toward the floor. 3. For __________ seconds, hold the muscle as tight  as you can without increasing your pain. 4. Relax the muscles slowly and completely. Repeat __________ times. Complete this exercise __________ times a day. Straight leg raises This exercise stretches the muscles in front of your thigh (quadriceps) and the muscles that move your hips (hip flexors). 1. Lie on your back with your left / right leg extended and your other knee bent. 2. Tense the muscles in the front of your left / right thigh. You should see your kneecap slide up or see increased dimpling just above the knee. Your thigh may even shake a bit. 3. Keep these muscles tight as you raise your leg 4-6 inches (10-15 cm) off the floor. Do not let your knee bend. 4. Hold this position for __________ seconds. 5. Keep these muscles tense as you lower your leg. 6. Relax your muscles slowly and completely after each repetition. Repeat __________ times. Complete this exercise __________ times a day. Hamstring, isometric 1. Lie on your back on a firm surface. 2. Bend your left / right knee about __________ degrees. 3. Dig your left / right heel into the surface as if you are trying to pull it toward your buttocks. Tighten the muscles in the back of your thighs (hamstring) to "dig" as hard as you can without increasing any pain. 4. Hold this position for __________ seconds. 5. Release the tension gradually and allow your muscles to relax completely for __________ seconds after each repetition. Repeat __________ times. Complete this exercise __________ times a day. Hamstring curls If told by your health care provider, do this exercise while wearing ankle weights. Begin with __________ lb weights. Then increase the weight by 1 lb (0.5 kg) increments. Do not wear ankle weights that are more than __________ lb. 1. Lie on your abdomen with your legs straight. 2. Bend your left / right knee as far as you can without feeling pain. Keep your hips flat against the floor. 3. Hold this position for  __________ seconds. 4. Slowly lower your leg to the starting position. Repeat __________ times. Complete this exercise __________ times a day. Squats This exercise strengthens the muscles in front of your thigh and knee (quadriceps). 1. Stand in front of a table, with your feet and knees pointing straight ahead. You may rest your hands on the table for balance but not for support. 2. Slowly bend your knees and lower your hips like you are going to sit in a chair. ? Keep your weight over your heels, not over your toes. ? Keep your lower legs upright so they are parallel with the table legs. ? Do not let your hips go lower than your knees. ? Do not bend lower than told by your health care provider. ? If your knee pain increases, do not bend as low. 3. Hold the squat position for __________ seconds. 4. Slowly push with your legs to return to standing. Do not use your hands to pull yourself to standing. Repeat __________ times. Complete this exercise __________ times a day. Wall slides  This exercise strengthens the muscles in front of your thigh and knee (quadriceps). 1. Lean your back against a smooth wall or door, and walk your feet out 18-24 inches (46-61 cm) from it. 2. Place your feet hip-width apart. 3. Slowly slide down the wall or door until your knees bend __________ degrees. Keep your knees over your heels, not over your toes. Keep your knees in line with your hips. 4. Hold this position for __________ seconds. Repeat __________ times. Complete this exercise __________ times a day. Straight leg raises This exercise strengthens the muscles that rotate the leg at the hip and move it away from your body (hip abductors). 1. Lie on your side with your left / right leg in the top position. Lie so your head, shoulder, knee, and hip line up. You may bend your bottom knee to help you keep your balance. 2. Roll your hips slightly forward so your hips are stacked directly over each other and your  left / right knee is facing forward. 3. Leading with your heel, lift your top leg 4-6 inches (10-15 cm). You should feel the muscles in your outer hip lifting. ? Do not let your foot drift forward. ? Do not let your knee roll toward the ceiling. 4. Hold this position for __________ seconds. 5. Slowly return your leg to the starting position. 6. Let your muscles relax completely after each repetition. Repeat __________ times. Complete this exercise __________ times a day. Straight leg raises This exercise stretches the muscles that move your hips away from the front of the pelvis (hip extensors). 1. Lie on your abdomen on a firm surface. You can put a pillow under your hips if that is more comfortable. 2. Tense the muscles in your buttocks and lift your left / right leg about 4-6 inches (10-15 cm). Keep your knee straight as you lift your leg. 3. Hold this position for __________ seconds. 4. Slowly lower your leg to the starting position. 5. Let your leg relax completely after each repetition. Repeat __________ times. Complete this exercise __________ times a day. This information is not intended to replace advice given to you by your health care provider. Make sure you discuss any questions you have with your health care provider. Document Revised: 09/21/2018 Document Reviewed: 09/21/2018 Elsevier Patient Education  2020 [employer].

## 2020-05-24 NOTE — Assessment & Plan Note (Signed)
Continue gabapentin for neuropathy.  

## 2020-05-24 NOTE — Assessment & Plan Note (Signed)
Blood pressure is elevated will resume losartan HCT he has a supply of this at home

## 2020-05-24 NOTE — Progress Notes (Signed)
Subjective:    Patient ID: Chad Bautista, male    DOB: 01-29-1976, 44 y.o.   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 The patient currently is homeless and just recently was released from the criminal justice system in prison.  He was in the justice system due to multiple DUIs and trying to allude a police chase while intoxicated.  Patient does have a lifelong history of stress and anxiety and grew up in a home in which his father left him in early age she does not communicate with the father.  The patient currently is at the [facility] homeless shelter but is working on a potential apartment within the next 30 days.  Patient does smoke a half a pack a day of cigarettes.  He states he drinks about occasional can of beer 2 or 3 days a week.  When I met the patient the homeless shelter we initiated losartan HCT for hypertension and did obtain for the patient orthopedic appointment.  He has the upcoming appointment on February 8 for orthopedics.  Pain has been quite severe and has been managing this with meloxicam daily and I did give him a very short course of opioids to use only as needed.  He has not been overusing these opioids and [employer] showed no other prescribers.  The patient has no other medical concerns at this visit.  He is here for additional blood work and to establish a primary care   04/09/2020 Since last OV has had R THR. Overall the patient's pain is markedly improved and is able to ambulate without any assistance.  Patient is now in an independent apartment and has furniture as well.  He only notes some swelling in the thigh near the incision site.  He no longer is drinking alcohol.  He is weaning himself off the oxycodone  He has an appointment in a week to have his  sutures removed The patient does have a history of diabetes and note had a hemoglobin A1c of 6.8 in the hospital with blood sugars in the 120-150 range.  The patient had lost significant weight and had been on insulin in the past  05/24/2020 This patient is seen in return follow-up and had right total hip replacement anterior approach in April.  The patient's wound is now healed and his hip no longer is in pain however he has developed increased knee pain for the past several weeks.  The pain is in the medial aspect of the right knee.  He also has minimal pain in the left hip.  Note he had been using methamphetamine since early May due to depression.  He states he is no longer using this.  He was associating with the wrong people he says.  The wound of his hip replacement has healed.  He has been holding his hypertensive meds and his blood pressure on arrival was 184/117 The patient is applying for disability currently is not working but does have housing  Past Medical History:  Diagnosis Date  . Diabetes mellitus without complication ([place])   . Head injury with loss of consciousness ([place])   . Hypertension      Family History  Problem Relation Age of Onset  . Heart disease Mother      Social History   Socioeconomic  History  . Marital status: Single    Spouse name: Not on file  . Number of children: Not on file  . Years of education: Not on file  . Highest education level: Not on file  Occupational History  . Not on file  Tobacco Use  . Smoking status: Current Every Day Smoker    Packs/day: 0.50    Years: 30.00    Pack years: 15.00  . Smokeless tobacco: Never Used  Vaping Use  . Vaping Use: Never used  Substance and Sexual Activity  . Alcohol use: Not Currently    Comment: rare- beer  . Drug use: Not Currently    Types: Cocaine    Comment: hx of cocaine use, states that he has been clean five years  . Sexual activity: Not Currently  Other Topics Concern  . Not on file    Social History Narrative  . Not on file   Social Determinants of Health   Financial Resource Strain:   . Difficulty of Paying Living Expenses:   Food Insecurity:   . Worried About Charity fundraiser in the Last Year:   . Arboriculturist in the Last Year:   Transportation Needs:   . Film/video editor (Medical):   Marland Kitchen Lack of Transportation (Non-Medical):   Physical Activity:   . Days of Exercise per Week:   . Minutes of Exercise per Session:   Stress:   . Feeling of Stress :   Social Connections:   . Frequency of Communication with Friends and Family:   . Frequency of Social Gatherings with Friends and Family:   . Attends Religious Services:   . Active Member of Clubs or Organizations:   . Attends Archivist Meetings:   Marland Kitchen Marital Status:   Intimate Partner Violence:   . Fear of Current or Ex-Partner:   . Emotionally Abused:   Marland Kitchen Physically Abused:   . Sexually Abused:      Allergies  Allergen Reactions  . Tramadol     Seizure     Outpatient Medications Prior to Visit  Medication Sig Dispense Refill  . Blood Glucose Monitoring Suppl (TRUE METRIX METER) w/Device KIT Use to measure blood sugar twice a day 1 kit 0  . glucose blood (TRUE METRIX BLOOD GLUCOSE TEST) test strip Use as instructed 100 each 12  . metFORMIN (GLUCOPHAGE) 500 MG tablet Take 1 tablet (500 mg total) by mouth 2 (two) times daily with a meal. 180 tablet 3  . mupirocin ointment (BACTROBAN) 2 % Apply 1 application topically 2 (two) times daily. Apply to the affected area 2 times a day 22 g 3  . TRUEplus Lancets 28G MISC Use to measure blood sugar twice a day 100 each 1  . Vitamin D, Ergocalciferol, (DRISDOL) 1.25 MG (50000 UNIT) CAPS capsule Take 1 capsule (50,000 Units total) by mouth every 7 (seven) days. 5 capsule 5  . docusate sodium (COLACE) 100 MG capsule Take 1 capsule (100 mg total) by mouth 2 (two) times daily. 10 capsule 0  . gabapentin (NEURONTIN) 300 MG capsule Take 2 capsules  (600 mg total) by mouth 3 (three) times daily. 180 capsule 1  . aspirin 81 MG chewable tablet Chew 1 tablet (81 mg total) by mouth 2 (two) times daily. (Patient not taking: Reported on 05/24/2020) 84 tablet 0  . doxycycline (VIBRA-TABS) 100 MG tablet Take 1 tablet (100 mg total) by mouth 2 (two) times daily. (Patient not taking: Reported on 05/24/2020) 20  tablet 0  . losartan-hydrochlorothiazide (HYZAAR) 100-25 MG tablet HOLD for NOW (Patient not taking: Reported on 05/24/2020) 30 tablet 1  . ondansetron (ZOFRAN) 4 MG tablet Take 1 tablet (4 mg total) by mouth every 6 (six) hours as needed for nausea. (Patient not taking: Reported on 05/24/2020) 20 tablet 0  . oxyCODONE (OXY IR/ROXICODONE) 5 MG immediate release tablet Take 1-2 tablets (5-10 mg total) by mouth every 6 (six) hours as needed for moderate pain (pain score 4-6). (Patient not taking: Reported on 05/24/2020) 30 tablet 0   No facility-administered medications prior to visit.      Review of Systems  Constitutional: Negative for fatigue and fever.  HENT: Negative.   Respiratory: Negative for cough, shortness of breath and wheezing.   Cardiovascular: Negative for palpitations and leg swelling.  Gastrointestinal: Negative.   Genitourinary: Negative.   Musculoskeletal:       Right knee pain  Neurological: Negative for dizziness and weakness.  Psychiatric/Behavioral: Positive for dysphoric mood and sleep disturbance. Negative for behavioral problems, decreased concentration and self-injury. The patient is nervous/anxious.        Objective:   Physical Exam   Vitals:   05/24/20 1005  BP: (!) 184/117  Pulse: 97  Resp: 16  Temp: (!) 96.6 F (35.9 C)  SpO2: 97%  Weight: 213 lb 12.8 oz (97 kg)    Gen: Withdrawn, well-nourished, in no distress, depressed l affect  ENT: No lesions,  mouth clear,  oropharynx clear, no postnasal drip  Neck: No JVD, no TMG, no carotid bruits  Lungs: No use of accessory muscles, no dullness to  percussion, clear without rales or rhonchi  Cardiovascular: RRR, heart sounds normal, no murmur or gallops, no peripheral edema  Abdomen: soft and NT, no HSM,  BS normal  Musculoskeletal: No deformities, no cyanosis or  Incision site in the right anterior thigh area looks good and the wound has healed  Neuro: alert, non focal  Skin: Warm, no lesions or rashes      Assessment & Plan:  I personally reviewed all images and lab data in the [facility] system as well as any outside material available during this office visit and agree with the  radiology impressions.   Chronic pain of right knee Chronic right knee pain likely due to medial tibial and femoral osteoarthritis  We will obtain complete knee films of the right knee  Will begin pulse dose of prednisone for inflammation discontinue oxycodone begin meloxicam 15 mg daily and also obtain Voltaren gel topically  Essential hypertension Blood pressure is elevated will resume losartan HCT he has a supply of this at home  Methamphetamine use ([place]) History of polysubstance use disorder with recent use of methamphetamine he states he is no longer using  Vitamin D deficiency Vitamin D deficiency well continue supplementation  Neuropathy due to type 2 diabetes mellitus (HCC) Continue gabapentin for neuropathy  Uncontrolled type 2 diabetes mellitus with hyperglycemia ([place]) Diabetes type 2 with improved control will continue Metformin twice daily   Jaylend was seen today for diabetes.  Diagnoses and all orders for this visit:  Uncontrolled type 2 diabetes mellitus with hyperglycemia (HCC) -     POCT glucose (manual entry)  Chronic pain of right knee -     DG Knee Complete 4 Views Right; Future  Tobacco use disorder  Essential hypertension  Substance abuse (HCC)  Methamphetamine use (HCC)  Moderate episode of recurrent major depressive disorder ([place])  Status post total replacement of right hip  Vitamin D  deficiency  Neuropathy due to type 2 diabetes mellitus ([place])  Other orders -     losartan-hydrochlorothiazide (HYZAAR) 100-25 MG tablet; Take one daily -     diclofenac Sodium (VOLTAREN) 1 % GEL; Apply 2 g topically 4 (four) times daily. To right knee -     meloxicam (MOBIC) 15 MG tablet; Take 1 tablet (15 mg total) by mouth daily. -     predniSONE (DELTASONE) 10 MG tablet; Take 4 tablets daily for 5 days then stop

## 2020-05-24 NOTE — Assessment & Plan Note (Signed)
History of polysubstance use disorder with recent use of methamphetamine he states he is no longer using

## 2020-05-24 NOTE — Assessment & Plan Note (Signed)
Diabetes type 2 with improved control will continue Metformin twice daily

## 2020-05-24 NOTE — Assessment & Plan Note (Signed)
Chronic right knee pain likely due to medial tibial and femoral osteoarthritis  We will obtain complete knee films of the right knee  Will begin pulse dose of prednisone for inflammation discontinue oxycodone begin meloxicam 15 mg daily and also obtain Voltaren gel topically

## 2020-05-24 NOTE — Assessment & Plan Note (Signed)
Vitamin D deficiency well continue supplementation

## 2020-06-11 ENCOUNTER — Other Ambulatory Visit: Payer: Self-pay | Admitting: Critical Care Medicine

## 2020-06-11 MED FILL — LOSARTAN-HCTZ 100-25 MG TAB: 100-25 | 30 days supply | Qty: 30 | Fill #0

## 2020-06-11 MED FILL — ?ERGOCALCIFEROL 50000 UNITC: 1.25 MG | 35 days supply | Qty: 5 | Fill #2

## 2020-06-11 MED FILL — METFORMIN HCL 500 MG TABS: 500 | 30 days supply | Qty: 60 | Fill #2

## 2020-06-11 NOTE — Telephone Encounter (Signed)
Pt checking status of refill request gabapentin . Pt states out of meds & unable to wait for provider return to the office.

## 2020-06-12 ENCOUNTER — Other Ambulatory Visit: Payer: Self-pay | Admitting: Critical Care Medicine

## 2020-06-12 MED ORDER — GABAPENTIN 300 MG PO CAPS: 600.0000 mg | ORAL_CAPSULE | Freq: Three times a day (TID) | ORAL | 1 refills | Status: DC

## 2020-06-12 MED FILL — GABAPENTIN 300 MG CAPSULE: 300 | 30 days supply | Qty: 180 | Fill #0

## 2020-07-10 ENCOUNTER — Telehealth: Payer: Self-pay

## 2020-07-10 ENCOUNTER — Telehealth: Payer: Self-pay | Admitting: Critical Care Medicine

## 2020-07-10 MED ORDER — CEPHALEXIN 500 MG PO CAPS: 500.0000 mg | ORAL_CAPSULE | Freq: Three times a day (TID) | ORAL | 0 refills | Status: AC

## 2020-07-10 MED FILL — LOSARTAN-HCTZ 100-25 MG TAB: 100-25 | 30 days supply | Qty: 30 | Fill #0

## 2020-07-10 MED FILL — CEPHALEXIN 500 MG CAPSULE: 500 | 7 days supply | Qty: 21 | Fill #0

## 2020-07-10 MED FILL — GABAPENTIN 300 MG CAPSULE: 300 | 30 days supply | Qty: 180 | Fill #1

## 2020-07-10 NOTE — Telephone Encounter (Signed)
Patient has been added to the schedule for next weeks clinic.

## 2020-07-10 NOTE — Telephone Encounter (Signed)
Pt called with area of pain and redness on back on neck at site of a shaving , painful, no fever  Rx KEflex tid x 7days and f/u visit   Please schedule telemedicine visit with me next week, note will be on mobile unit, ok to put on that schedule

## 2020-07-10 NOTE — Telephone Encounter (Signed)
Thank you :)

## 2020-07-16 ENCOUNTER — Other Ambulatory Visit: Payer: Self-pay

## 2020-07-16 ENCOUNTER — Other Ambulatory Visit: Payer: Self-pay | Admitting: Critical Care Medicine

## 2020-07-16 ENCOUNTER — Telehealth: Payer: Self-pay | Admitting: Critical Care Medicine

## 2020-07-16 DIAGNOSIS — E1165 Type 2 diabetes mellitus with hyperglycemia: Secondary | ICD-10-CM

## 2020-07-16 DIAGNOSIS — L0211 Cutaneous abscess of neck: Secondary | ICD-10-CM

## 2020-07-16 DIAGNOSIS — F1510 Other stimulant abuse, uncomplicated: Secondary | ICD-10-CM

## 2020-07-16 DIAGNOSIS — L03221 Cellulitis of neck: Secondary | ICD-10-CM

## 2020-07-16 DIAGNOSIS — E559 Vitamin D deficiency, unspecified: Secondary | ICD-10-CM

## 2020-07-16 DIAGNOSIS — I10 Essential (primary) hypertension: Secondary | ICD-10-CM

## 2020-07-16 MED ORDER — LOSARTAN POTASSIUM-HCTZ 100-25 MG PO TABS: ORAL_TABLET | ORAL | 1 refills | Status: DC

## 2020-07-16 MED ORDER — MELOXICAM 15 MG PO TABS: 15.0000 mg | ORAL_TABLET | Freq: Every day | ORAL | 0 refills | Status: DC

## 2020-07-16 MED ORDER — METFORMIN HCL 500 MG PO TABS
1000.0000 mg | ORAL_TABLET | Freq: Two times a day (BID) | ORAL | 3 refills | Status: DC
Start: 2020-07-16 — End: 2020-11-27

## 2020-07-16 MED ORDER — VITAMIN D (ERGOCALCIFEROL) 1.25 MG (50000 UNIT) PO CAPS: 50000.0000 [IU] | ORAL_CAPSULE | ORAL | 5 refills | Status: DC

## 2020-07-16 MED FILL — METFORMIN HCL 500 MG TABS: 500 | 30 days supply | Qty: 120 | Fill #0

## 2020-07-16 MED FILL — ?ERGOCALCIFEROL 50000 UNITC: 1.25 MG | 28 days supply | Qty: 4 | Fill #0

## 2020-07-16 MED FILL — MELOXICAM 15 MG TABLET: 15 | 30 days supply | Qty: 30 | Fill #0

## 2020-07-16 NOTE — Progress Notes (Signed)
Subjective:    Patient ID: Chad Bautista, male    DOB: [birth year], 44 y.o.   MRN: [id number] Virtual Visit via Telephone Note  I connected with Chad Bautista on 07/16/20 at 11:20 AM EDT by telephone and verified that I am speaking with the correct person using two identifiers.   Consent:  I discussed the limitations, risks, security and privacy concerns of performing an evaluation and management service by telephone and the availability of in person appointments. I also discussed with the patient that there may be a patient responsible charge related to this service. The patient expressed understanding and agreed to proceed.  Location of patient: Patient was at home  Location of provider: I was in my office at the mobile clinic  Persons participating in the televisit with the patient.    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 The patient currently is homeless and just recently was released from the criminal justice system in prison.  He was in the justice system due to multiple DUIs and trying to allude a police chase while intoxicated.  Patient does have a lifelong history of stress and anxiety and grew up in a home in which his father left him in early age she does not communicate with the father.  The patient currently is at the [facility] homeless shelter but is working on a potential apartment within the next 30 days.  Patient does smoke a half a pack a day of cigarettes.  He states he drinks about occasional can of beer 2 or 3 days a week.  When I met the patient the homeless shelter we initiated losartan HCT for hypertension and did obtain for the patient orthopedic appointment.  He has the upcoming appointment on February 8 for  orthopedics.  Pain has been quite severe and has been managing this with meloxicam daily and I did give him a very short course of opioids to use only as needed.  He has not been overusing these opioids and [employer] showed no other prescribers.  The patient has no other medical concerns at this visit.  He is here for additional blood work and to establish a primary care   04/09/2020 Since last OV has had R THR. Overall the patient's pain is markedly improved and is able to ambulate without any assistance.  Patient is now in an independent apartment and has furniture as well.  He only notes some swelling in the thigh near the incision site.  He no longer is drinking alcohol.  He is weaning himself off the oxycodone  He has an appointment in a week to have his sutures removed The patient does have a history of diabetes and note had a hemoglobin A1c of 6.8 in the hospital with blood sugars in the 120-150 range.  The patient had lost significant weight and had been on insulin in the past  05/24/2020 This patient is seen in return follow-up and had right total hip replacement anterior approach in April.  The patient's wound is now healed and his hip no longer is in pain however he has developed increased knee pain for the past several weeks.  The pain is in the medial aspect of the right knee.  He also has minimal pain  in the left hip.  Note he had been using methamphetamine since early May due to depression.  He states he is no longer using this.  He was associating with the wrong people he says.  The wound of his hip replacement has healed.  He has been holding his hypertensive meds and his blood pressure on arrival was 184/117 The patient is applying for disability currently is not working but does have housing  07/16/2020 This is a follow-up telephone visit for this 44 year old male who has a history of methamphetamine use recent right hip replacement chronic pain syndrome.  Overall  the patient continues to use methamphetamine multiple times a week.  He states he uses this drug because it is an expensive.  Patient also recent developed cellulitis of the back of the neck was seen in the emergency room diagnosed with potential MRSA infection was placed on oral Bactrim which she is finishing at this time.  At that visit his blood sugar on arrival was 47 was brought down to 199 with insulin.  He is on Metformin 500 mg twice daily  Note he had taken prednisone he had leftover from previous inflammatory event for this recent neck issue.  Patient has no other complaints at this visit he does feel the area in the back of his neck is improving.  Past Medical History:  Diagnosis Date   Diabetes mellitus without complication ([place])    Head injury with loss of consciousness ([place])    Hypertension      Family History  Problem Relation Age of Onset   Heart disease Mother      Social History   Socioeconomic History   Marital status: Single    Spouse name: Not on file   Number of children: Not on file   Years of education: Not on file   Highest education level: Not on file  Occupational History   Not on file  Tobacco Use   Smoking status: Current Every Day Smoker    Packs/day: 0.50    Years: 30.00    Pack years: 15.00   Smokeless tobacco: Never Used  Scientific laboratory technician Use: Never used  Substance and Sexual Activity   Alcohol use: Not Currently    Comment: rare- beer   Drug use: Not Currently    Types: Cocaine    Comment: hx of cocaine use, states that he has been clean five years   Sexual activity: Not Currently  Other Topics Concern   Not on file  Social History Narrative   Not on file   Social Determinants of Health   Financial Resource Strain:    Difficulty of Paying Living Expenses:   Food Insecurity:    Worried About Charity fundraiser in the Last Year:    Arboriculturist in the Last Year:   Transportation Needs:    Lexicographer (Medical):    Lack of Transportation (Non-Medical):   Physical Activity:    Days of Exercise per Week:    Minutes of Exercise per Session:   Stress:    Feeling of Stress :   Social Connections:    Frequency of Communication with Friends and Family:    Frequency of Social Gatherings with Friends and Family:    Attends Religious Services:    Active Member of Clubs or Organizations:    Attends Archivist Meetings:    Marital Status:   Intimate Partner Violence:    Fear of Current  or Ex-Partner:    Emotionally Abused:    Physically Abused:    Sexually Abused:      Allergies  Allergen Reactions   Tramadol     Seizure     Outpatient Medications Prior to Visit  Medication Sig Dispense Refill   Blood Glucose Monitoring Suppl (TRUE METRIX METER) w/Device KIT Use to measure blood sugar twice a day 1 kit 0   cephALEXin (KEFLEX) 500 MG capsule Take 1 capsule (500 mg total) by mouth 3 (three) times daily for 7 days. 21 capsule 0   glucose blood (TRUE METRIX BLOOD GLUCOSE TEST) test strip Use as instructed 100 each 12   losartan-hydrochlorothiazide (HYZAAR) 100-25 MG tablet Take one daily 30 tablet 1   metFORMIN (GLUCOPHAGE) 500 MG tablet Take 1 tablet (500 mg total) by mouth 2 (two) times daily with a meal. 180 tablet 3   mupirocin ointment (BACTROBAN) 2 % Apply 1 application topically 2 (two) times daily. Apply to the affected area 2 times a day 22 g 3   sulfamethoxazole-trimethoprim (BACTRIM DS) 800-160 MG tablet Take by mouth.     TRUEplus Lancets 28G MISC Use to measure blood sugar twice a day 100 each 1   diclofenac Sodium (VOLTAREN) 1 % GEL Apply 2 g topically 4 (four) times daily. To right knee (Patient not taking: Reported on 07/16/2020) 50 g 1   gabapentin (NEURONTIN) 300 MG capsule Take 2 capsules (600 mg total) by mouth 3 (three) times daily. 180 capsule 1   meloxicam (MOBIC) 15 MG tablet Take 1 tablet (15 mg total) by mouth daily.  (Patient not taking: Reported on 07/16/2020) 30 tablet 0   Vitamin D, Ergocalciferol, (DRISDOL) 1.25 MG (50000 UNIT) CAPS capsule Take 1 capsule (50,000 Units total) by mouth every 7 (seven) days. (Patient not taking: Reported on 07/16/2020) 5 capsule 5   No facility-administered medications prior to visit.      Review of Systems  Constitutional: Negative for fatigue and fever.  HENT: Negative.   Respiratory: Negative for cough, shortness of breath and wheezing.   Cardiovascular: Negative for palpitations and leg swelling.  Gastrointestinal: Negative.   Genitourinary: Negative.   Musculoskeletal:       Right knee pain  Neurological: Negative for dizziness and weakness.  Psychiatric/Behavioral: Positive for dysphoric mood and sleep disturbance. Negative for behavioral problems, decreased concentration and self-injury. The patient is nervous/anxious.        Objective:   Physical Exam  There were no vitals filed for this visit. This is a telephone visit there is no exam    Assessment & Plan:  I personally reviewed all images and lab data in the [facility] system as well as any outside material available during this office visit and agree with the  radiology impressions.   Cellulitis and abscess of neck Posterior neck cellulitis with recent worsening of infection needing drainage and debridement by emergency room physician  Patient will see me this week for direct exam and will continue current Bactrim antibiotic till finished  Methamphetamine use ([place]) History of polysubstance use including now methamphetamine  We discussed the need to have this stopped he is asking for help we will connect him with services at his visit on Thursday  Vitamin D deficiency Patient currently has vitamin D deficiency and is taking weekly vitamin D he will need refills on his meds  Essential hypertension Hypertension that has been poorly controlled we'll follow this patient up in the office at the next  visit continue losartan  HCT for now  Uncontrolled type 2 diabetes mellitus with hyperglycemia ([place]) Type 2 diabetes poorly controlled recent ER visit with blood sugar over 400  This patient will likely need to begin Lantus at the next visit we will discuss this when he comes this week to the mobile clinic   Diagnoses and all orders for this visit:  Cellulitis and abscess of neck  Methamphetamine use ([place])  Vitamin D deficiency  Essential hypertension  Uncontrolled type 2 diabetes mellitus with hyperglycemia ([place])     Follow Up Instructions: The patient knows to come see me at the mobile health clinic this upcoming Thursday for direct exam  The patient will receive a Covid vaccine [place] when he comes to our mobile clinic this week he agrees to this   I discussed the assessment and treatment plan with the patient. The patient was provided an opportunity to ask questions and all were answered. The patient agreed with the plan and demonstrated an understanding of the instructions.   The patient was advised to call back or seek an in-person evaluation if the symptoms worsen or if the condition fails to improve as anticipated.  I provided 30 minutes of non-face-to-face time during this encounter  including  median intraservice time , review of notes, labs, imaging, medications  and explaining diagnosis and management to the patient .    Asencion Noble, MD

## 2020-07-16 NOTE — Assessment & Plan Note (Signed)
Patient currently has vitamin D deficiency and is taking weekly vitamin D he will need refills on his meds

## 2020-07-16 NOTE — Assessment & Plan Note (Signed)
Hypertension that has been poorly controlled we'll follow this patient up in the office at the next visit continue losartan HCT for now

## 2020-07-16 NOTE — Assessment & Plan Note (Addendum)
Type 2 diabetes poorly controlled recent ER visit with blood sugar over 400  In the interim we'll increase Metformin to 1000 mg twice daily  This patient will likely need to begin Lantus at the next visit we will discuss this when he comes this week to the mobile clinic

## 2020-07-16 NOTE — Assessment & Plan Note (Signed)
Posterior neck cellulitis with recent worsening of infection needing drainage and debridement by emergency room physician  Patient will see me this week for direct exam and will continue current Bactrim antibiotic till finished

## 2020-07-16 NOTE — Assessment & Plan Note (Signed)
History of polysubstance use including now methamphetamine  We discussed the need to have this stopped he is asking for help we will connect him with services at his visit on Thursday

## 2020-07-19 ENCOUNTER — Ambulatory Visit: Payer: Self-pay

## 2020-08-07 ENCOUNTER — Ambulatory Visit: Payer: Medicaid Other | Admitting: Orthopaedic Surgery

## 2020-09-28 ENCOUNTER — Other Ambulatory Visit: Payer: Self-pay | Admitting: Critical Care Medicine

## 2020-09-28 MED ORDER — LOSARTAN POTASSIUM-HCTZ 100-25 MG PO TABS: ORAL_TABLET | ORAL | 0 refills | Status: DC

## 2020-09-28 MED ORDER — GABAPENTIN 300 MG PO CAPS: 600.0000 mg | ORAL_CAPSULE | Freq: Three times a day (TID) | ORAL | 3 refills | Status: DC

## 2020-09-28 NOTE — Telephone Encounter (Signed)
Medication Refill - Medication: losartan-hydrochlorothiazide (HYZAAR) 100-25 MG tablet  Needs more testing supplies  gabapentin (NEURONTIN) 300 MG capsule metFORMIN (GLUCOPHAGE) 500 MG tablet [[id number]]  Has the patient contacted their pharmacy? Yes.   (Agent: If no, request that the patient contact the pharmacy for the refill.) (Agent: If yes, when and what did the pharmacy advise?)  Preferred Pharmacy (with phone number or street name):  [facility] [place], [place] - [place] [address]  [address] [place] [address]  Phone: [phone] Fax: [phone]     Agent: Please be advised that RX refills may take up to 3 business days. We ask that you follow-up with your pharmacy.

## 2020-10-01 MED FILL — GABAPENTIN 300 MG CAPSULE: 300 | 30 days supply | Qty: 180 | Fill #0

## 2020-10-01 MED FILL — ?LOSARTAN POTASSIUM-HCTZ 10: 100-25 | 30 days supply | Qty: 30 | Fill #0

## 2020-10-14 ENCOUNTER — Ambulatory Visit (HOSPITAL_COMMUNITY)
Admission: EM | Admit: 2020-10-14 | Discharge: 2020-10-14 | Disposition: A | Discharge status: Home / Self Care | Payer: Medicaid Other | Attending: Family Medicine | Admitting: Family Medicine

## 2020-10-14 ENCOUNTER — Other Ambulatory Visit: Payer: Self-pay

## 2020-10-14 ENCOUNTER — Encounter (HOSPITAL_COMMUNITY): Payer: Self-pay

## 2020-10-14 DIAGNOSIS — H7291 Unspecified perforation of tympanic membrane, right ear: Secondary | ICD-10-CM

## 2020-10-14 DIAGNOSIS — H9201 Otalgia, right ear: Secondary | ICD-10-CM

## 2020-10-14 DIAGNOSIS — H6591 Unspecified nonsuppurative otitis media, right ear: Secondary | ICD-10-CM

## 2020-10-14 MED ORDER — POLYMYXIN B-TRIMETHOPRIM 10000-0.1 UNIT/ML-% OP SOLN
2.0000 [drp] | OPHTHALMIC | 0 refills | Status: DC
Start: 2020-10-14 — End: 2020-11-27

## 2020-10-14 MED ORDER — PREDNISONE 10 MG (21) PO TBPK
ORAL_TABLET | Freq: Every day | ORAL | 0 refills | Status: AC
Start: 2020-10-14 — End: 2020-10-20

## 2020-10-14 NOTE — Discharge Instructions (Addendum)
Prescribed polytrim drops. Two drops to the right ear every 4 hours while awake for 7 days  I have sent in a prednisone taper for you to take for 6 days. 6 tablets on day one, 5 tablets on day two, 4 tablets on day three, 3 tablets on day four, 2 tablets on day five, and 1 tablet on day six.  Referral placed for ear, nose and throat  Follow up with this office or with primary care if symptoms are persisting.  Follow up in the ER for high fever, trouble swallowing, trouble breathing, other concerning symptoms.

## 2020-10-14 NOTE — ED Provider Notes (Addendum)
[facility]   [id number] 10/14/20 Arrival Time: 6948  CC: EAR PAIN  SUBJECTIVE: History from: patient.  Chad Bautista is a 44 y.o. male who presents with of right ear pain for the past 2 days. Reports that he was cleaning his ear with a Q tip and that he hit his hand and shoved the Q tip in too far. Reports pain and yellow drainage from the ear since then. Patient states the pain is constant and achy in character. Patient has not aken OTC medications for this. Denies fever, chills, fatigue, sinus pain, rhinorrhea, ear discharge, sore throat, SOB, wheezing, chest pain, nausea, changes in bowel or bladder habits.    ROS: As per HPI.  All other pertinent ROS negative.     Past Medical History:  Diagnosis Date  . Diabetes mellitus without complication ([place])   . Head injury with loss of consciousness ([place])   . Hypertension    Past Surgical History:  Procedure Laterality Date  . ANKLE FRACTURE SURGERY    . APPENDECTOMY    . NASAL SEPTUM SURGERY    . TONSILLECTOMY    . TOTAL HIP ARTHROPLASTY Right 04/02/2020   Procedure: RIGHT TOTAL HIP ARTHROPLASTY ANTERIOR APPROACH;  Surgeon: Leandrew Koyanagi, MD;  Location: [facility];  Service: Orthopedics;  Laterality: Right;   Allergies  Allergen Reactions  . Tramadol     Seizure   No current facility-administered medications on file prior to encounter.   Current Outpatient Medications on File Prior to Encounter  Medication Sig Dispense Refill  . Blood Glucose Monitoring Suppl (TRUE METRIX METER) w/Device KIT Use to measure blood sugar twice a day 1 kit 0  . diclofenac Sodium (VOLTAREN) 1 % GEL Apply 2 g topically 4 (four) times daily. To right knee (Patient not taking: Reported on 07/16/2020) 50 g 1  . gabapentin (NEURONTIN) 300 MG capsule Take 2 capsules (600 mg total) by mouth 3 (three) times daily. 180 capsule 3  . glucose blood (TRUE METRIX BLOOD GLUCOSE TEST) test strip Use as instructed 100 each 12  . losartan-hydrochlorothiazide  (HYZAAR) 100-25 MG tablet Take one daily 90 tablet 0  . meloxicam (MOBIC) 15 MG tablet Take 1 tablet (15 mg total) by mouth daily. 30 tablet 0  . metFORMIN (GLUCOPHAGE) 500 MG tablet Take 2 tablets (1,000 mg total) by mouth 2 (two) times daily with a meal. 180 tablet 3  . mupirocin ointment (BACTROBAN) 2 % Apply 1 application topically 2 (two) times daily. Apply to the affected area 2 times a day 22 g 3  . TRUEplus Lancets 28G MISC Use to measure blood sugar twice a day 100 each 1  . Vitamin D, Ergocalciferol, (DRISDOL) 1.25 MG (50000 UNIT) CAPS capsule Take 1 capsule (50,000 Units total) by mouth every 7 (seven) days. 12 capsule 5   Social History   Socioeconomic History  . Marital status: Single    Spouse name: Not on file  . Number of children: Not on file  . Years of education: Not on file  . Highest education level: Not on file  Occupational History  . Not on file  Tobacco Use  . Smoking status: Current Every Day Smoker    Packs/day: 0.50    Years: 30.00    Pack years: 15.00  . Smokeless tobacco: Never Used  Vaping Use  . Vaping Use: Never used  Substance and Sexual Activity  . Alcohol use: Not Currently    Comment: rare- beer  . Drug use: Not Currently  Types: Cocaine    Comment: hx of cocaine use, states that he has been clean five years  . Sexual activity: Not Currently  Other Topics Concern  . Not on file  Social History Narrative  . Not on file   Social Determinants of Health   Financial Resource Strain:   . Difficulty of Paying Living Expenses: Not on file  Food Insecurity:   . Worried About Charity fundraiser in the Last Year: Not on file  . Ran Out of Food in the Last Year: Not on file  Transportation Needs:   . Lack of Transportation (Medical): Not on file  . Lack of Transportation (Non-Medical): Not on file  Physical Activity:   . Days of Exercise per Week: Not on file  . Minutes of Exercise per Session: Not on file  Stress:   . Feeling of Stress :  Not on file  Social Connections:   . Frequency of Communication with Friends and Family: Not on file  . Frequency of Social Gatherings with Friends and Family: Not on file  . Attends Religious Services: Not on file  . Active Member of Clubs or Organizations: Not on file  . Attends Archivist Meetings: Not on file  . Marital Status: Not on file  Intimate Partner Violence:   . Fear of Current or Ex-Partner: Not on file  . Emotionally Abused: Not on file  . Physically Abused: Not on file  . Sexually Abused: Not on file   Family History  Problem Relation Age of Onset  . Heart disease Mother     OBJECTIVE:  Vitals:   10/14/20 1646  BP: (!) 156/83  Pulse: 87  Resp: 18  Temp: 98.9 F (37.2 C)  TempSrc: Oral  SpO2: 98%     General appearance: alert; appears fatigued HEENT: Ears: R EAC clear, L EAC with erythema, drainage, tenderness, R TM pearly gray with visible cone of light, without erythema, L TM perforated and erythematous, yellow fluid draining; Eyes: PERRL, EOMI grossly; Sinuses nontender to palpation; Nose: clear rhinorrhea; Throat: oropharynx mildly erythematous, tonsils 1+ without white tonsillar exudates, uvula midline Neck: supple without LAD Lungs: unlabored respirations, symmetrical air entry; cough: absent; no respiratory distress Heart: regular rate and rhythm.  Radial pulses 2+ symmetrical bilaterally Skin: warm and dry Psychological: alert and cooperative; normal mood and affect  Imaging: No results found.   ASSESSMENT & PLAN:  1. Acute otalgia, right   2. Perforation of right tympanic membrane   3. OME (otitis media with effusion), right     Meds ordered this encounter  Medications  . trimethoprim-polymyxin b (POLYTRIM) ophthalmic solution    Sig: Place 2 drops into the right ear every 4 (four) hours.    Dispense:  10 mL    Refill:  0    Order Specific Question:   Supervising Provider    Answer:   Chase Picket [id number]  .  predniSONE (STERAPRED UNI-PAK 21 TAB) 10 MG (21) TBPK tablet    Sig: Take by mouth daily for 6 days. Take 6 tablets on day 1, 5 tablets on day 2, 4 tablets on day 3, 3 tablets on day 4, 2 tablets on day 5, 1 tablet on day 6    Dispense:  21 tablet    Refill:  0    Order Specific Question:   Supervising Provider    Answer:   Chase Picket [id number]    Prescribed polytrim drops Prescribed steroid taper Take medications  as directed and to completion Placed referral to ENT if needed If symptoms and pain are persisting past course of medication, TM may need surgical repair Continue to use OTC ibuprofen and/ or tylenol as needed for pain control Follow up with PCP if symptoms persists Return here or go to the ER if you have any new or worsening symptoms   Reviewed expectations re: course of current medical issues. Questions answered. Outlined signs and symptoms indicating need for more acute intervention. Patient verbalized understanding. After Visit Summary given.         Faustino Congress, NP 10/16/20 [place]    Faustino Congress, NP 10/16/20 1645

## 2020-10-14 NOTE — ED Triage Notes (Signed)
Pt present right ear pain, pt states he was cleaning his ear with a qtip and made a mistake and hit his ear drum. Now his right is draining blood and he cannot ear out of it.

## 2020-10-15 ENCOUNTER — Ambulatory Visit: Payer: Self-pay | Attending: Family | Admitting: Family

## 2020-10-15 DIAGNOSIS — M25552 Pain in left hip: Secondary | ICD-10-CM

## 2020-10-15 DIAGNOSIS — G8929 Other chronic pain: Secondary | ICD-10-CM

## 2020-10-15 MED FILL — TRUEplus LANCETS 28G MISC: 50 days supply | Qty: 100 | Fill #1

## 2020-10-15 MED FILL — ?METFORMIN HCL 500MG TABL: 500 | 30 days supply | Qty: 120 | Fill #0

## 2020-10-15 MED FILL — GABAPENTIN 300 MG CAPSULE: 300 | 30 days supply | Qty: 180 | Fill #0

## 2020-10-15 MED FILL — ?LOSARTAN POTASSIUM-HCTZ 10: 100-25 | 30 days supply | Qty: 30 | Fill #0

## 2020-10-15 MED FILL — TRUE METRIX TEST STRIP: 25 days supply | Qty: 100 | Fill #1

## 2020-10-15 NOTE — Patient Instructions (Signed)
Hip Pain The hip is the joint between the upper legs and the lower pelvis. The bones, cartilage, tendons, and muscles of your hip joint support your body and allow you to move around. Hip pain can range from a minor ache to severe pain in one or both of your hips. The pain may be felt on the inside of the hip joint near the groin, or on the outside near the buttocks and upper thigh. You may also have swelling or stiffness in your hip area. Follow these instructions at home: Managing pain, stiffness, and swelling      If directed, put ice on the painful area. To do this: ? Put ice in a plastic bag. ? Place a towel between your skin and the bag. ? Leave the ice on for 20 minutes, 2-3 times a day.  If directed, apply heat to the affected area as often as told by your health care provider. Use the heat source that your health care provider recommends, such as a moist heat pack or a heating pad. ? Place a towel between your skin and the heat source. ? Leave the heat on for 20-30 minutes. ? Remove the heat if your skin turns bright red. This is especially important if you are unable to feel pain, heat, or cold. You may have a greater risk of getting burned. Activity  Do exercises as told by your health care provider.  Avoid activities that cause pain. General instructions   Take over-the-counter and prescription medicines only as told by your health care provider.  Keep a journal of your symptoms. Write down: ? How often you have hip pain. ? The location of your pain. ? What the pain feels like. ? What makes the pain worse.  Sleep with a pillow between your legs on your most comfortable side.  Keep all follow-up visits as told by your health care provider. This is important. Contact a health care provider if:  You cannot put weight on your leg.  Your pain or swelling continues or gets worse after one week.  It gets harder to walk.  You have a fever. Get help right away  if:  You fall.  You have a sudden increase in pain and swelling in your hip.  Your hip is red or swollen or very tender to touch. Summary  Hip pain can range from a minor ache to severe pain in one or both of your hips.  The pain may be felt on the inside of the hip joint near the groin, or on the outside near the buttocks and upper thigh.  Avoid activities that cause pain.  Write down how often you have hip pain, the location of the pain, what makes it worse, and what it feels like. This information is not intended to replace advice given to you by your health care provider. Make sure you discuss any questions you have with your health care provider. Document Revised: 04/18/2019 Document Reviewed: 04/18/2019 Elsevier Patient Education  2020 [employer] -- 

## 2020-10-15 NOTE — Progress Notes (Signed)
Virtual Visit via Telephone Note  I connected with Chad Bautista, on 10/15/2020 at 3:19 PM by telephone due to the COVID-19 pandemic and verified that I am speaking with the correct person using two identifiers.  Due to current restrictions/limitations of in-office visits due to the COVID-19 pandemic, this scheduled clinical appointment was converted to a telehealth visit.   Consent: I discussed the limitations, risks, security and privacy concerns of performing an evaluation and management service by telephone and the availability of in person appointments. I also discussed with the patient that there may be a patient responsible charge related to this service. The patient expressed understanding and agreed to proceed.  Location of Patient: Home  Location of Provider: Scientist, research (physical sciences) and [place]  Persons participating in Telemedicine visit: Josias Tomerlin Belinda Schlichting Aten, NP Elmon Else, CMA  History of Present Illness: Chad Bautista is a 44 year-old male who presents with history of essential hypertension, uncontrolled type 2 diabetes mellitus with hyperglycemia, neuropathy due to type 2 diabetes mellitus, tobacco use disorder, alcohol use, moderate episode of recurrent major depressive disorder, vitamin D deficiency, methamphetamine use, cocaine use, substance abuse, and chronic pain of right knee who presents today for hip pain.   1. HIP PAIN: Duration: months started shortly after April 2021 when he had right hip replaced Involved hip: left  Mechanism of injury: unknown Location: diffuse Onset: gradual  Severity: 7/10  Quality: sharp and aching Frequency: constant Radiation: yes, sometimes down the right leg Aggravating factors: walking   Alleviating factors: ice and NSAIDs  Status: worse Treatments attempted: rest, ice, ibuprofen and aleve   Relief with NSAIDs?: no Weakness with weight bearing: yes, has to lead with the good leg Weakness with walking:  yes Paresthesias / decreased sensation: sometimes  Swelling: sometimes  Redness:no  Hasn't seen Orthopedics recently because missed last appointment.  Past Medical History:  Diagnosis Date  . Diabetes mellitus without complication ([place])   . Head injury with loss of consciousness ([place])   . Hypertension    Allergies  Allergen Reactions  . Tramadol     Seizure    Current Outpatient Medications on File Prior to Visit  Medication Sig Dispense Refill  . Blood Glucose Monitoring Suppl (TRUE METRIX METER) w/Device KIT Use to measure blood sugar twice a day 1 kit 0  . diclofenac Sodium (VOLTAREN) 1 % GEL Apply 2 g topically 4 (four) times daily. To right knee (Patient not taking: Reported on 07/16/2020) 50 g 1  . gabapentin (NEURONTIN) 300 MG capsule Take 2 capsules (600 mg total) by mouth 3 (three) times daily. 180 capsule 3  . glucose blood (TRUE METRIX BLOOD GLUCOSE TEST) test strip Use as instructed 100 each 12  . losartan-hydrochlorothiazide (HYZAAR) 100-25 MG tablet Take one daily 90 tablet 0  . meloxicam (MOBIC) 15 MG tablet Take 1 tablet (15 mg total) by mouth daily. 30 tablet 0  . metFORMIN (GLUCOPHAGE) 500 MG tablet Take 2 tablets (1,000 mg total) by mouth 2 (two) times daily with a meal. 180 tablet 3  . mupirocin ointment (BACTROBAN) 2 % Apply 1 application topically 2 (two) times daily. Apply to the affected area 2 times a day 22 g 3  . predniSONE (STERAPRED UNI-PAK 21 TAB) 10 MG (21) TBPK tablet Take by mouth daily for 6 days. Take 6 tablets on day 1, 5 tablets on day 2, 4 tablets on day 3, 3 tablets on day 4, 2 tablets on day 5, 1 tablet on day 6 21 tablet  0  . trimethoprim-polymyxin b (POLYTRIM) ophthalmic solution Place 2 drops into the right ear every 4 (four) hours. 10 mL 0  . TRUEplus Lancets 28G MISC Use to measure blood sugar twice a day 100 each 1  . Vitamin D, Ergocalciferol, (DRISDOL) 1.25 MG (50000 UNIT) CAPS capsule Take 1 capsule (50,000 Units total) by mouth every 7  (seven) days. 12 capsule 5   No current facility-administered medications on file prior to visit.    Observations/Objective: Alert and oriented x 3. Not in acute distress. Physical examination not completed as this is a telemedicine visit.  Assessment and Plan: 1. Chronic left hip pain: - Diagnostic x-ray of left hip for chronic pain.  - Continue Ibuprofen over-the-counter, ice, and rest as needed.  - Says Meloxicam and Voltaren gel hasn't helped in the past. - Follow-up with primary physician as needed. - DG Hip Unilat W OR W/O Pelvis 2-3 Views Left; Future  Follow Up Instructions: Follow-up with primary physician as needed.   Patient was given clear instructions to go to Emergency Department or return to medical center if symptoms don't improve, worsen, or new problems develop.The patient verbalized understanding.  I discussed the assessment and treatment plan with the patient. The patient was provided an opportunity to ask questions and all were answered. The patient agreed with the plan and demonstrated an understanding of the instructions.   The patient was advised to call back or seek an in-person evaluation if the symptoms worsen or if the condition fails to improve as anticipated.   I provided 6 minutes total of non-face-to-face time during this encounter including median intraservice time, reviewing previous notes, labs, imaging, medications, management and patient verbalized understanding.    Camillia Herter, NP  [facility] [place], [place]   10/15/2020, 8:20 AM

## 2020-11-06 ENCOUNTER — Other Ambulatory Visit: Payer: Self-pay | Admitting: Critical Care Medicine

## 2020-11-06 NOTE — Telephone Encounter (Signed)
Medication Refill - Medication: Gabapentin   Has the patient contacted their pharmacy? Yes.   (Agent: If no, request that the patient contact the pharmacy for the refill.) (Agent: If yes, when and what did the pharmacy advise?)  Preferred Pharmacy (with phone number or street name):  [facility] - [place], [place] - [place] [address]  [address] [place] [address]  Phone: [phone] Fax: [phone]  Hours: Not open 24 hours     Agent: Please be advised that RX refills may take up to 3 business days. We ask that you follow-up with your pharmacy.

## 2020-11-06 NOTE — Telephone Encounter (Signed)
Requested medication (s) are due for refill today: expired medication  Requested medication (s) are on the active medication list: yes  Last refill:  start: 09/28/20 end: 10/28/20 #180 3 refills  Future visit scheduled: no  Notes to clinic:  expired medication. Do you want to renew Rx?     Requested Prescriptions  Pending Prescriptions Disp Refills   gabapentin (NEURONTIN) 300 MG capsule 180 capsule 3    Sig: Take 2 capsules (600 mg total) by mouth 3 (three) times daily.      Neurology: Anticonvulsants - gabapentin Passed - 11/06/2020  4:07 PM      Passed - Valid encounter within last 12 months    Recent Outpatient Visits           3 weeks ago Chronic left hip pain   [facility] [place], [place] J, NP   5 months ago Uncontrolled type 2 diabetes mellitus with hyperglycemia [facility])   [place] [facility] Storm Frisk, MD   7 months ago Uncontrolled type 2 diabetes mellitus with hyperglycemia [facility])   [facility] Storm Frisk, MD   9 months ago Essential hypertension   [facility] Storm Frisk, MD

## 2020-11-27 ENCOUNTER — Other Ambulatory Visit: Payer: Self-pay

## 2020-11-27 ENCOUNTER — Other Ambulatory Visit: Payer: Self-pay | Admitting: Critical Care Medicine

## 2020-11-27 ENCOUNTER — Ambulatory Visit (HOSPITAL_BASED_OUTPATIENT_CLINIC_OR_DEPARTMENT_OTHER): Payer: Medicaid Other | Admitting: Pharmacist

## 2020-11-27 ENCOUNTER — Ambulatory Visit: Payer: Self-pay | Attending: Critical Care Medicine | Admitting: Critical Care Medicine

## 2020-11-27 ENCOUNTER — Encounter: Payer: Self-pay | Admitting: Critical Care Medicine

## 2020-11-27 VITALS — BP 167/102 | HR 79 | Temp 98.4°F | Resp 16 | Wt 228.0 lb

## 2020-11-27 DIAGNOSIS — E1169 Type 2 diabetes mellitus with other specified complication: Secondary | ICD-10-CM | POA: Insufficient documentation

## 2020-11-27 DIAGNOSIS — F17200 Nicotine dependence, unspecified, uncomplicated: Secondary | ICD-10-CM

## 2020-11-27 DIAGNOSIS — I10 Essential (primary) hypertension: Secondary | ICD-10-CM | POA: Insufficient documentation

## 2020-11-27 DIAGNOSIS — Z23 Encounter for immunization: Secondary | ICD-10-CM

## 2020-11-27 DIAGNOSIS — H7291 Unspecified perforation of tympanic membrane, right ear: Secondary | ICD-10-CM | POA: Insufficient documentation

## 2020-11-27 DIAGNOSIS — F331 Major depressive disorder, recurrent, moderate: Secondary | ICD-10-CM | POA: Insufficient documentation

## 2020-11-27 DIAGNOSIS — E1165 Type 2 diabetes mellitus with hyperglycemia: Secondary | ICD-10-CM

## 2020-11-27 DIAGNOSIS — E1140 Type 2 diabetes mellitus with diabetic neuropathy, unspecified: Secondary | ICD-10-CM | POA: Insufficient documentation

## 2020-11-27 DIAGNOSIS — E11621 Type 2 diabetes mellitus with foot ulcer: Secondary | ICD-10-CM | POA: Insufficient documentation

## 2020-11-27 DIAGNOSIS — L089 Local infection of the skin and subcutaneous tissue, unspecified: Secondary | ICD-10-CM | POA: Insufficient documentation

## 2020-11-27 DIAGNOSIS — F439 Reaction to severe stress, unspecified: Secondary | ICD-10-CM | POA: Insufficient documentation

## 2020-11-27 DIAGNOSIS — G894 Chronic pain syndrome: Secondary | ICD-10-CM | POA: Insufficient documentation

## 2020-11-27 DIAGNOSIS — M25551 Pain in right hip: Secondary | ICD-10-CM | POA: Insufficient documentation

## 2020-11-27 DIAGNOSIS — Z1211 Encounter for screening for malignant neoplasm of colon: Secondary | ICD-10-CM | POA: Insufficient documentation

## 2020-11-27 DIAGNOSIS — Z1159 Encounter for screening for other viral diseases: Secondary | ICD-10-CM | POA: Insufficient documentation

## 2020-11-27 DIAGNOSIS — Z5901 Sheltered homelessness: Secondary | ICD-10-CM | POA: Insufficient documentation

## 2020-11-27 DIAGNOSIS — F1910 Other psychoactive substance abuse, uncomplicated: Secondary | ICD-10-CM | POA: Insufficient documentation

## 2020-11-27 DIAGNOSIS — Z794 Long term (current) use of insulin: Secondary | ICD-10-CM | POA: Insufficient documentation

## 2020-11-27 DIAGNOSIS — F17210 Nicotine dependence, cigarettes, uncomplicated: Secondary | ICD-10-CM | POA: Insufficient documentation

## 2020-11-27 DIAGNOSIS — Z791 Long term (current) use of non-steroidal anti-inflammatories (NSAID): Secondary | ICD-10-CM | POA: Insufficient documentation

## 2020-11-27 DIAGNOSIS — M25552 Pain in left hip: Secondary | ICD-10-CM | POA: Insufficient documentation

## 2020-11-27 DIAGNOSIS — H6691 Otitis media, unspecified, right ear: Secondary | ICD-10-CM | POA: Insufficient documentation

## 2020-11-27 DIAGNOSIS — E785 Hyperlipidemia, unspecified: Secondary | ICD-10-CM | POA: Insufficient documentation

## 2020-11-27 DIAGNOSIS — Z79899 Other long term (current) drug therapy: Secondary | ICD-10-CM | POA: Insufficient documentation

## 2020-11-27 DIAGNOSIS — E11628 Type 2 diabetes mellitus with other skin complications: Secondary | ICD-10-CM | POA: Insufficient documentation

## 2020-11-27 DIAGNOSIS — F419 Anxiety disorder, unspecified: Secondary | ICD-10-CM | POA: Insufficient documentation

## 2020-11-27 LAB — POCT GLYCOSYLATED HEMOGLOBIN (HGB A1C): HbA1c, POC (controlled diabetic range): 6.7 % (ref 0.0–7.0)

## 2020-11-27 LAB — GLUCOSE, POCT (MANUAL RESULT ENTRY): POC Glucose: 199 mg/dl — AB (ref 70–99)

## 2020-11-27 MED ORDER — ATORVASTATIN CALCIUM 20 MG PO TABS: 20.0000 mg | ORAL_TABLET | Freq: Every day | ORAL | 3 refills | Status: DC

## 2020-11-27 MED ORDER — VITAMIN D (ERGOCALCIFEROL) 1.25 MG (50000 UNIT) PO CAPS: 50000.0000 [IU] | ORAL_CAPSULE | ORAL | 5 refills | Status: DC

## 2020-11-27 MED ORDER — TRUEPLUS LANCETS 28G MISC: 1 refills | Status: DC

## 2020-11-27 MED ORDER — VALSARTAN-HYDROCHLOROTHIAZIDE 160-12.5 MG PO TABS: 1.0000 | ORAL_TABLET | Freq: Every day | ORAL | 2 refills | Status: DC

## 2020-11-27 MED ORDER — CEFTRIAXONE SODIUM 500 MG IJ SOLR
500.0000 mg | Freq: Once | INTRAMUSCULAR | Status: AC
  Administered 2020-11-27: 500 mg via INTRAMUSCULAR

## 2020-11-27 MED ORDER — MUPIROCIN 2 % EX OINT: 1.0000 "application " | TOPICAL_OINTMENT | Freq: Two times a day (BID) | CUTANEOUS | 3 refills | Status: DC

## 2020-11-27 MED ORDER — SULFAMETHOXAZOLE-TRIMETHOPRIM 800-160 MG PO TABS: 1.0000 | ORAL_TABLET | Freq: Two times a day (BID) | ORAL | 0 refills | Status: DC

## 2020-11-27 MED ORDER — GABAPENTIN 300 MG PO CAPS: 600.0000 mg | ORAL_CAPSULE | Freq: Three times a day (TID) | ORAL | 3 refills | Status: DC

## 2020-11-27 MED ORDER — METFORMIN HCL 500 MG PO TABS: 1000.0000 mg | ORAL_TABLET | Freq: Two times a day (BID) | ORAL | 3 refills | Status: DC

## 2020-11-27 MED ORDER — TRUE METRIX BLOOD GLUCOSE TEST VI STRP: ORAL_STRIP | 12 refills | Status: DC

## 2020-11-27 MED ORDER — TRULICITY 0.75 MG/0.5ML ~~LOC~~ SOAJ: 0.7500 mg | SUBCUTANEOUS | 4 refills | Status: DC

## 2020-11-27 MED FILL — ?BACTRIM DS 800-160 MG TABS: 7 days supply | Qty: 14 | Fill #0

## 2020-11-27 MED FILL — ?ATORVASTATIN 20 MG TABLET: 20 | 30 days supply | Qty: 30 | Fill #0

## 2020-11-27 MED FILL — GABAPENTIN 300 MG CAPSULE: 300 | 30 days supply | Qty: 180 | Fill #0

## 2020-11-27 MED FILL — ?TRULICITY 0.75 MG/0.5ML PE: 0.75 | 28 days supply | Qty: 2 | Fill #0

## 2020-11-27 MED FILL — METFORMIN HCL 500 MG TABS: 500 | 30 days supply | Qty: 120 | Fill #0

## 2020-11-27 MED FILL — TRUEplus LANCETS 28G MISC: 50 days supply | Qty: 100 | Fill #0

## 2020-11-27 MED FILL — VALSARTAN-HCTZ 160-12.5 MG: 160-12.5 | 30 days supply | Qty: 30 | Fill #0

## 2020-11-27 MED FILL — TRUE METRIX TEST STRIP: 25 days supply | Qty: 100 | Fill #0

## 2020-11-27 MED FILL — MUPIROCIN 2% OINTMENT: 2 | 10 days supply | Qty: 22 | Fill #0

## 2020-11-27 MED FILL — VIT D2 1.25 MG (50,000 UNIT: 1.25 MG | 84 days supply | Qty: 12 | Fill #0

## 2020-11-27 NOTE — Assessment & Plan Note (Signed)
Diabetic ulcer with diabetic foot infection secondary to type 2 diabetes and diabetic neuropathy  Referral to orthopedics Dr. Lajoyce Corners  Begin Bactrim 1 twice daily double strength for 7 days  Dressing applied with topical triple antibiotic ointment  Ceftriaxone 500 mg IM given this visit

## 2020-11-27 NOTE — Progress Notes (Signed)
Patient was educated on the use of the Trulicity device. Reviewed necessary supplies and operation of the pen. Also reviewed goal blood glucose levels. Patient was able to demonstrate use. All questions and concerns were addressed.  Butch Penny, PharmD, P[place]y [place], CPP Clinical Pharmacist [facility] [phone]

## 2020-11-27 NOTE — Assessment & Plan Note (Signed)
Obtain urine drug screen

## 2020-11-27 NOTE — Assessment & Plan Note (Signed)
Refill gabapentin 

## 2020-11-27 NOTE — Assessment & Plan Note (Signed)
Hyperlipidemia secondary to type 2 diabetes begin atorvastatin check liver function

## 2020-11-27 NOTE — Assessment & Plan Note (Signed)
Referral made to clinical social worker

## 2020-11-27 NOTE — Progress Notes (Signed)
Subjective:    Patient ID: Chad Bautista, male    DOB: [birth year], 44 y.o.   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 The patient currently is homeless and just recently was released from the criminal justice system in prison.  He was in the justice system due to multiple DUIs and trying to allude a police chase while intoxicated.  Patient does have a lifelong history of stress and anxiety and grew up in a home in which his father left him in early age she does not communicate with the father.  The patient currently is at the [place] homeless shelter but is working on a potential apartment within the next 30 days.  Patient does smoke a half a pack a day of cigarettes.  He states he drinks about occasional can of beer 2 or 3 days a week.  When I met the patient the homeless shelter we initiated losartan HCT for hypertension and did obtain for the patient orthopedic appointment.  He has the upcoming appointment on February 8 for orthopedics.  Pain has been quite severe and has been managing this with meloxicam daily and I did give him a very short course of opioids to use only as needed.  He has not been overusing these opioids and [employer] showed no other prescribers.  The patient has no other medical concerns at this visit.  He is here for additional blood work and to establish a primary care   04/09/2020 Since last OV has had R THR. Overall the patient's pain is markedly improved and is able to ambulate without any assistance.  Patient is now in an independent apartment and has furniture as well.  He only notes some swelling in the thigh near the incision site.  He no longer is drinking alcohol.  He is weaning himself off the oxycodone  He has an  appointment in a week to have his sutures removed The patient does have a history of diabetes and note had a hemoglobin A1c of 6.8 in the hospital with blood sugars in the 120-150 range.  The patient had lost significant weight and had been on insulin in the past  05/24/2020 This patient is seen in return follow-up and had right total hip replacement anterior approach in April.  The patient's wound is now healed and his hip no longer is in pain however he has developed increased knee pain for the past several weeks.  The pain is in the medial aspect of the right knee.  He also has minimal pain in the left hip.  Note he had been using methamphetamine since early May due to depression.  He states he is no longer using this.  He was associating with the wrong people he says.  The wound of his hip replacement has healed.  He has been holding his hypertensive meds and his blood pressure on arrival was 184/117 The patient is applying for disability currently is not working but does have housing  07/16/2020 This is a follow-up telephone visit for this 44 year old male who has a history of methamphetamine use recent right hip replacement chronic pain syndrome.  Overall the patient continues to use methamphetamine multiple times a week.  He states he uses this drug because it is an expensive.  Patient  also recent developed cellulitis of the back of the neck was seen in the emergency room diagnosed with potential MRSA infection was placed on oral Bactrim which she is finishing at this time.  At that visit his blood sugar on arrival was 47 was brought down to 199 with insulin.  He is on Metformin 500 mg twice daily  Note he had taken prednisone he had leftover from previous inflammatory event for this recent neck issue.  Patient has no other complaints at this visit he does feel the area in the back of his neck is improving.  11/27/2020 This is a 44 year old male I previously saw in the [place] clinic and  saw a few times here and home health and wellness but have not seen this patient since this past summer.  Patient does have type 2 diabetes poorly controlled history of met amphetamine use, hypertension, vitamin D deficiency and foot pain and numbness.  Also has had repeated infections in the on his back and neck.  Patient on arrival has a blood sugar of 199 and A1c actually at 6.7  Patient is noted increase in ulceration at the base of his right foot plantar aspect he had walked a long distance and how shoes after his car broke down and wore this all ulceration in the foot.  He noticed he has had this problem before in the left foot.  Also had a ruptured eardrum went to the emergency room for this still has drainage from that left ear and difficulty hearing and pain his left hip continues to be chronically painful he has had a right hip replacement and this resolved all the pain in the right hip  The patient is yet to receive a Covid vaccine   Past Medical History:  Diagnosis Date  . Cocaine use 04/24/2020  . Diabetes mellitus without complication ([place])   . Head injury with loss of consciousness ([place])   . Hypertension      Family History  Problem Relation Age of Onset  . Heart disease Mother      Social History   Socioeconomic History  . Marital status: Single    Spouse name: Not on file  . Number of children: Not on file  . Years of education: Not on file  . Highest education level: Not on file  Occupational History  . Not on file  Tobacco Use  . Smoking status: Current Every Day Smoker    Packs/day: 0.50    Years: 30.00    Pack years: 15.00  . Smokeless tobacco: Never Used  Vaping Use  . Vaping Use: Never used  Substance and Sexual Activity  . Alcohol use: Not Currently    Comment: rare- beer  . Drug use: Not Currently    Types: Cocaine    Comment: hx of cocaine use, states that he has been clean five years  . Sexual activity: Not Currently  Other Topics Concern  .  Not on file  Social History Narrative  . Not on file   Social Determinants of Health   Financial Resource Strain: Not on file  Food Insecurity: Not on file  Transportation Needs: Not on file  Physical Activity: Not on file  Stress: Not on file  Social Connections: Not on file  Intimate Partner Violence: Not on file     Allergies  Allergen Reactions  . Tramadol     Seizure     Outpatient Medications Prior to Visit  Medication Sig Dispense Refill  .  Blood Glucose Monitoring Suppl (TRUE METRIX METER) w/Device KIT Use to measure blood sugar twice a day 1 kit 0  . gabapentin (NEURONTIN) 300 MG capsule Take 2 capsules (600 mg total) by mouth 3 (three) times daily. 180 capsule 3  . glucose blood (TRUE METRIX BLOOD GLUCOSE TEST) test strip Use as instructed 100 each 12  . losartan-hydrochlorothiazide (HYZAAR) 100-25 MG tablet Take one daily 90 tablet 0  . metFORMIN (GLUCOPHAGE) 500 MG tablet Take 2 tablets (1,000 mg total) by mouth 2 (two) times daily with a meal. 180 tablet 3  . mupirocin ointment (BACTROBAN) 2 % Apply 1 application topically 2 (two) times daily. Apply to the affected area 2 times a day 22 g 3  . trimethoprim-polymyxin b (POLYTRIM) ophthalmic solution Place 2 drops into the right ear every 4 (four) hours. 10 mL 0  . TRUEplus Lancets 28G MISC Use to measure blood sugar twice a day 100 each 1  . diclofenac Sodium (VOLTAREN) 1 % GEL Apply 2 g topically 4 (four) times daily. To right knee (Patient not taking: No sig reported) 50 g 1  . meloxicam (MOBIC) 15 MG tablet Take 1 tablet (15 mg total) by mouth daily. (Patient not taking: Reported on 11/27/2020) 30 tablet 0  . Vitamin D, Ergocalciferol, (DRISDOL) 1.25 MG (50000 UNIT) CAPS capsule Take 1 capsule (50,000 Units total) by mouth every 7 (seven) days. (Patient not taking: Reported on 11/27/2020) 12 capsule 5   No facility-administered medications prior to visit.      Review of Systems  Constitutional: Negative for  fatigue and fever.  HENT: Positive for ear discharge, ear pain and hearing loss.        Perforated Left TM  Respiratory: Negative for cough, shortness of breath and wheezing.   Cardiovascular: Negative for palpitations and leg swelling.  Gastrointestinal: Negative.   Genitourinary: Negative.   Musculoskeletal:       Left hip pain  Skin: Positive for wound.       R foot planter aspect wound  Neurological: Negative for dizziness and weakness.  Psychiatric/Behavioral: Positive for dysphoric mood and sleep disturbance. Negative for behavioral problems, decreased concentration and self-injury. The patient is nervous/anxious.        Objective:   Physical Exam    Vitals:   11/27/20 0923  BP: (!) 167/102  Pulse: 79  Resp: 16  Temp: 98.4 F (36.9 C)  TempSrc: Oral  SpO2: 95%  Weight: 228 lb (103.4 kg)    Gen: Pleasant, well-nourished, in no distress,  Flat affect  ENT: perforated R TM with purulence  mouth clear,  oropharynx clear, no postnasal drip  Neck: No JVD, no TMG, no carotid bruits  Lungs: No use of accessory muscles, no dullness to percussion, clear without rales or rhonchi  Cardiovascular: RRR, heart sounds normal, no murmur or gallops, no peripheral edema  Abdomen: soft and NT, no HSM,  BS normal  Musculoskeletal: No deformities, no cyanosis or clubbing  Neuro: alert, non focal  Skin: at plantar aspect in the R forefoot a half dollar coin sized ulceration seen with early purulence.  Pulses intact.  R foot swollen and warm    Assessment & Plan:  I personally reviewed all images and lab data in the [facility] system as well as any outside material available during this office visit and agree with the  radiology impressions.   Substance abuse ([place]) Obtain urine drug screen  Moderate episode of recurrent major depressive disorder [facility]) Referral made to clinical social  worker  Uncontrolled type 2 diabetes mellitus with hyperglycemia ([place]) Uncontrolled diabetes due  to lack of medication adherence  Resume Metformin at 1000 mg twice daily  Begin Trulicity 75 mcg weekly  Follow diabetic diet  Follow-up with clinical pharmacy  Type 2 diabetes mellitus with diabetic foot infection ([place]) Diabetic ulcer with diabetic foot infection secondary to type 2 diabetes and diabetic neuropathy  Referral to orthopedics Dr. Sharol Given  Begin Bactrim 1 twice daily double strength for 7 days  Dressing applied with topical triple antibiotic ointment  Ceftriaxone 500 mg IM given this visit  Neuropathy due to type 2 diabetes mellitus ([place]) Refill gabapentin  Essential hypertension Begin valsartan HCT for hypertension as hypertension poorly controlled  Hyperlipidemia associated with type 2 diabetes mellitus ([place]) Hyperlipidemia secondary to type 2 diabetes begin atorvastatin check liver function  Tobacco use disorder    . Current smoking consumption amount: 1 pack a day  . Dicsussion on advise to quit smoking and smoking impacts: Impact on cardiovascular health  . Patient's willingness to quit: Willing to quit  . Methods to quit smoking discussed: Behavioral modification  . Medication management of smoking session drugs discussed: Nicotine replacement  . Resources provided:  AVS   . Setting quit date not determined  . Follow-up arranged 1 month   Time spent counseling the patient: 5 minutes     Valdemar was seen today for foot pain and hip pain.  Diagnoses and all orders for this visit:  Uncontrolled type 2 diabetes mellitus with hyperglycemia (HCC) -     Glucose (CBG) -     HgB A1c -     Comprehensive metabolic panel -     Lipid panel  Type 2 diabetes mellitus with diabetic foot infection (HCC) -     cefTRIAXone (ROCEPHIN) injection 500 mg -     Ambulatory referral to Orthopedic Surgery -     Comprehensive metabolic panel -     CBC with Differential/Platelet  Need for hepatitis C screening test -     HCV Ab w Reflex to Quant  PCR  Chronic otitis media with perforated tympanic membrane, right  Substance abuse (HCC)  Moderate episode of recurrent major depressive disorder (HCC)  Neuropathy due to type 2 diabetes mellitus ([place])  Essential hypertension  Hyperlipidemia associated with type 2 diabetes mellitus (HCC)  Tobacco use disorder  Other orders -     sulfamethoxazole-trimethoprim (BACTRIM DS) 800-160 MG tablet; Take 1 tablet by mouth 2 (two) times daily. -     Vitamin D, Ergocalciferol, (DRISDOL) 1.25 MG (50000 UNIT) CAPS capsule; Take 1 capsule (50,000 Units total) by mouth every 7 (seven) days. -     gabapentin (NEURONTIN) 300 MG capsule; Take 2 capsules (600 mg total) by mouth 3 (three) times daily. -     metFORMIN (GLUCOPHAGE) 500 MG tablet; Take 2 tablets (1,000 mg total) by mouth 2 (two) times daily with a meal. -     mupirocin ointment (BACTROBAN) 2 %; Apply 1 application topically 2 (two) times daily. Apply to the affected area 2 times a day -     glucose blood (TRUE METRIX BLOOD GLUCOSE TEST) test strip; Use as instructed -     valsartan-hydrochlorothiazide (DIOVAN HCT) 160-12.5 MG tablet; Take 1 tablet by mouth daily. -     TRUEplus Lancets 28G MISC; Use to measure blood sugar twice a day -     Dulaglutide (TRULICITY) 5.32 DJ/2.4QA SOPN; Inject 0.75 mg into the skin once a week. -  atorvastatin (LIPITOR) 20 MG tablet; Take 1 tablet (20 mg total) by mouth daily. -     Tdap vaccine greater than or equal to 7yo IM  Patient agrees to receive Covid vaccine patient given information as to where to follow-up on this  Fecal occult study given to screen for colon cancer  Administer tetanus vaccine this visit

## 2020-11-27 NOTE — Assessment & Plan Note (Signed)
  .   Current smoking consumption amount: 1 pack a day  . Dicsussion on advise to quit smoking and smoking impacts: Impact on cardiovascular health  . Patient's willingness to quit: Willing to quit  . Methods to quit smoking discussed: Behavioral modification  . Medication management of smoking session drugs discussed: Nicotine replacement  . Resources provided:  AVS   . Setting quit date not determined  . Follow-up arranged 1 month   Time spent counseling the patient: 5 minutes

## 2020-11-27 NOTE — Assessment & Plan Note (Signed)
Uncontrolled diabetes due to lack of medication adherence  Resume Metformin at 1000 mg twice daily  Begin Trulicity 75 mcg weekly  Follow diabetic diet  Follow-up with clinical pharmacy

## 2020-11-27 NOTE — Progress Notes (Signed)
Right foot and left hip pain  Right foot pain and open wound about 3 weeks.

## 2020-11-27 NOTE — Assessment & Plan Note (Signed)
Begin valsartan HCT for hypertension as hypertension poorly controlled

## 2020-11-27 NOTE — Patient Instructions (Addendum)
For your ruptured eardrum you will be seeing Dr. Lazarus Salines of ENT this week in our clinic  For your diabetic foot ulcer on been refer you back to Dr. Lajoyce Corners and we will address this wound you will also receive a tetanus vaccine and also you were given ceftriaxone 500 mg IM and you will be given a 7-day course of Bactrim to take twice a day for antibiotic  Refills on all your medicines were made  Your diabetes is not well controlled and to begin you on Trulicity 1 injection weekly and refill the Metformin to 1000 mg twice daily An appointment with Franky Macho our clinical pharmacist will be made  For your depression anxiety and that I have you get an appointment with Jenel Lucks our clinical social worker for counseling  Complete set of labs are being drawn today number to start you on atorvastatin 1 a day for cholesterol management  Your gabapentin was refilled  Begin valsartan HCT 1 daily for blood pressure this is your new blood pressure medicine  Please obtain a Covid vaccine you can call the number below or you can go to the mobile medicine unit at [facility] tomorrow between 8 AM and 5 PM for the first series of the vaccines they have the moderna vaccine COVID-19 Vaccine Information can be found at: [email or web address] For questions related to vaccine distribution or appointments, please email [email or web address] or call [phone].     Hepatitis C will be screened  You need a pneumonia vaccine [place] to hold off on that  Refill on the mupirocin ointment was given apply that to the wound with your dressing changes  Return to see Dr. Delford Field in 3 weeks  Urine drug screen will be obtained today  Focus on smoking cessation  We will give you a financial assistance packet to begin to fill out so he can obtain financial assistance    Tobacco Use Disorder Tobacco use disorder (TUD) occurs when a person craves, seeks,  and uses tobacco, regardless of the consequences. This disorder can cause problems with mental and physical health. It can affect your ability to have healthy relationships, and it can keep you from meeting your responsibilities at work, home, or school. Tobacco may be:  Smoked as a cigarette or cigar.  Inhaled using e-cigarettes.  Smoked in a pipe or hookah.  Chewed as smokeless tobacco.  Inhaled into the nostrils as snuff. Tobacco products contain a dangerous chemical called nicotine, which is very addictive. Nicotine triggers hormones that make the body feel stimulated and works on areas of the brain that make you feel good. These effects can make it hard for people to quit nicotine. Tobacco contains many other unsafe chemicals that can damage almost every organ in the body. Smoking tobacco also puts others in danger due to fire risk and possible health problems caused by breathing in secondhand smoke. What are the signs or symptoms? Symptoms of TUD may include:  Being unable to slow down or stop your tobacco use.  Spending an abnormal amount of time getting or using tobacco.  Craving tobacco. Cravings may last for up to 6 months after quitting.  Tobacco use that: ? Interferes with your work, school, or home life. ? Interferes with your personal and social relationships. ? Makes you give up activities that you once enjoyed or found important.  Using tobacco even though you know that it is: ? Dangerous or bad for your health or someone else's health. ? Causing problems in your life.  Needing more and more of the substance to get the same effect (developing tolerance).  Experiencing unpleasant symptoms if you do not use the substance (withdrawal). Withdrawal symptoms may include: ? Depressed, anxious, or irritable mood. ? Difficulty concentrating. ? Increased appetite. ? Restlessness or trouble sleeping.  Using the substance to avoid withdrawal. How is this diagnosed? This  condition may be diagnosed based on:  Your current and past tobacco use. Your health care provider may ask questions about how your tobacco use affects your life.  A physical exam. You may be diagnosed with TUD if you have at least two symptoms within a 73-month period. How is this treated? This condition is treated by stopping tobacco use. Many people are unable to quit on their own and need help. Treatment may include:  Nicotine replacement therapy (NRT). NRT provides nicotine without the other harmful chemicals in tobacco. NRT gradually lowers the dosage of nicotine in the body and reduces withdrawal symptoms. NRT is available as: ? Over-the-counter gums, lozenges, and skin patches. ? Prescription mouth inhalers and nasal sprays.  Medicine that acts on the brain to reduce cravings and withdrawal symptoms.  A type of talk therapy that examines your triggers for tobacco use, how to avoid them, and how to cope with cravings (behavioral therapy).  Hypnosis. This may help with withdrawal symptoms.  Joining a support group for others coping with TUD. The best treatment for TUD is usually a combination of medicine, talk therapy, and support groups. Recovery can be a long process. Many people start using tobacco again after stopping (relapse). If you relapse, it does not mean that treatment will not work. Follow these instructions at home:  Lifestyle  Do not use any products that contain nicotine or tobacco, such as cigarettes and e-cigarettes.  Avoid things that trigger tobacco use as much as you can. Triggers include people and situations that usually cause you to use tobacco.  Avoid drinks that contain caffeine, including coffee. These may worsen some withdrawal symptoms.  Find ways to manage stress. Wanting to smoke may cause stress, and stress can make you want to smoke. Relaxation techniques such as deep breathing, meditation, and yoga may help.  Attend support groups as needed.  These groups are an important part of long-term recovery for many people. General instructions  Take over-the-counter and prescription medicines only as told by your health care provider.  Check with your health care provider before taking any new prescription or over-the-counter medicines.  Decide on a friend, family member, or smoking quit-line (such as 1-800-QUIT-NOW in the [place] that you can call or text when you feel the urge to smoke or when you need help coping with cravings.  Keep all follow-up visits as told by your health care provider and therapist. This is important. Contact a health care provider if:  You are not able to take your medicines as prescribed.  Your symptoms get worse, even with treatment. Summary  Tobacco use disorder (TUD) occurs when a person craves, seeks, and uses tobacco regardless of the consequences.  This condition may be diagnosed based on your current and past tobacco use and a physical exam.  Many people are unable to quit on their own and need help. Recovery can be a long process.  The most effective treatment for TUD is usually a combination of medicine, talk therapy, and support groups. This information is not intended to replace advice given to you by your health care provider. Make sure you discuss any questions you  have with your health care provider. Document Revised: 11/18/2017 Document Reviewed: 11/18/2017 Elsevier Patient Education  2020 [employer]    Dulaglutide injection What is this medicine? DULAGLUTIDE (DOO la GLOO tide) is used to improve blood sugar control in adults with type 2 diabetes. This medicine may be used with other oral diabetes medicines. This drug may also reduce the risk of heart attack or stroke if you have type 2 diabetes and risk factors for heart disease. This medicine may be used for other purposes; ask your health care provider or pharmacist if you have questions. COMMON BRAND NAME(S): Trulicity What  should I tell my health care provider before I take this medicine? They need to know if you have any of these conditions:  endocrine tumors (MEN 2) or if someone in your family had these tumors  eye disease, vision problems  history of pancreatitis  kidney disease  liver disease  stomach problems  thyroid cancer or if someone in your family had thyroid cancer  an unusual or allergic reaction to dulaglutide, other medicines, foods, dyes, or preservatives  pregnant or trying to get pregnant  breast-feeding How should I use this medicine? This medicine is for injection under the skin of your upper leg (thigh), stomach area, or upper arm. It is usually given once every week (every 7 days). You will be taught how to prepare and give this medicine. Use exactly as directed. Take your medicine at regular intervals. Do not take it more often than directed. If you use this medicine with insulin, you should inject this medicine and the insulin separately. Do not mix them together. Do not give the injections right next to each other. Change (rotate) injection sites with each injection. It is important that you put your used needles and syringes in a special sharps container. Do not put them in a trash can. If you do not have a sharps container, call your pharmacist or healthcare provider to get one. A special MedGuide will be given to you by the pharmacist with each prescription and refill. Be sure to read this information carefully each time. This drug comes with INSTRUCTIONS FOR USE. Ask your pharmacist for directions on how to use this drug. Read the information carefully. Talk to your pharmacist or health care provider if you have questions. Talk to your pediatrician regarding the use of this medicine in children. Special care may be needed. Overdosage: If you think you have taken too much of this medicine contact a poison control center or emergency room at once. NOTE: This medicine is only  for you. Do not share this medicine with others. What if I miss a dose? If you miss a dose, take it as soon as you can within 3 days after the missed dose. Then take your next dose at your regular weekly time. If it has been longer than 3 days after the missed dose, do not take the missed dose. Take the next dose at your regular time. Do not take double or extra doses. If you have questions about a missed dose, contact your health care provider for advice. What may interact with this medicine?  other medicines for diabetes Many medications may cause changes in blood sugar, these include:  alcohol containing beverages  antiviral medicines for HIV or AIDS  aspirin and aspirin-like drugs  certain medicines for blood pressure, heart disease, irregular heart beat  chromium  diuretics  male hormones, such as estrogens or progestins, birth control pills  fenofibrate  gemfibrozil  isoniazid  lanreotide  male hormones or anabolic steroids  MAOIs like Carbex, Eldepryl, Marplan, Nardil, and Parnate  medicines for weight loss  medicines for allergies, asthma, cold, or cough  medicines for depression, anxiety, or psychotic disturbances  niacin  nicotine  NSAIDs, medicines for pain and inflammation, like ibuprofen or naproxen  octreotide  pasireotide  pentamidine  phenytoin  probenecid  quinolone antibiotics such as ciprofloxacin, levofloxacin, ofloxacin  some herbal dietary supplements  steroid medicines such as prednisone or cortisone  sulfamethoxazole; trimethoprim  thyroid hormones Some medications can hide the warning symptoms of low blood sugar (hypoglycemia). You may need to monitor your blood sugar more closely if you are taking one of these medications. These include:  beta-blockers, often used for high blood pressure or heart problems (examples include atenolol, metoprolol, propranolol)  clonidine  guanethidine  reserpine This list may not  describe all possible interactions. Give your health care provider a list of all the medicines, herbs, non-prescription drugs, or dietary supplements you use. Also tell them if you smoke, drink alcohol, or use illegal drugs. Some items may interact with your medicine. What should I watch for while using this medicine? Visit your doctor or health care professional for regular checks on your progress. Drink plenty of fluids while taking this medicine. Check with your doctor or health care professional if you get an attack of severe diarrhea, nausea, and vomiting. The loss of too much body fluid can make it dangerous for you to take this medicine. A test called the HbA1C (A1C) will be monitored. This is a simple blood test. It measures your blood sugar control over the last 2 to 3 months. You will receive this test every 3 to 6 months. Learn how to check your blood sugar. Learn the symptoms of low and high blood sugar and how to manage them. Always carry a quick-source of sugar with you in case you have symptoms of low blood sugar. Examples include hard sugar candy or glucose tablets. Make sure others know that you can choke if you eat or drink when you develop serious symptoms of low blood sugar, such as seizures or unconsciousness. They must get medical help at once. Tell your doctor or health care professional if you have high blood sugar. You might need to change the dose of your medicine. If you are sick or exercising more than usual, you might need to change the dose of your medicine. Do not skip meals. Ask your doctor or health care professional if you should avoid alcohol. Many nonprescription cough and cold products contain sugar or alcohol. These can affect blood sugar. Pens should never be shared. Even if the needle is changed, sharing may result in passing of viruses like hepatitis or HIV. Wear a medical ID bracelet or chain, and carry a card that describes your disease and details of your  medicine and dosage times. What side effects may I notice from receiving this medicine? Side effects that you should report to your doctor or health care professional as soon as possible:  allergic reactions like skin rash, itching or hives, swelling of the face, lips, or tongue  breathing problems  changes in vision  diarrhea that continues or is severe  lump or swelling on the neck  severe nausea  signs and symptoms of infection like fever or chills; cough; sore throat; pain or trouble passing urine  signs and symptoms of low blood sugar such as feeling anxious, confusion, dizziness, increased hunger, unusually weak or tired,  sweating, shakiness, cold, irritable, headache, blurred vision, fast heartbeat, loss of consciousness  signs and symptoms of kidney injury like trouble passing urine or change in the amount of urine  trouble swallowing  unusual stomach upset or pain  vomiting Side effects that usually do not require medical attention (report to your doctor or health care professional if they continue or are bothersome):  diarrhea  loss of appetite  nausea  pain, redness, or irritation at site where injected  stomach upset This list may not describe all possible side effects. Call your doctor for medical advice about side effects. You may report side effects to FDA at 1-800-FDA-1088. Where should I keep my medicine? Keep out of the reach of children. Store unopened pens in a refrigerator between 2 and 8 degrees C (36 and 46 degrees F). Do not freeze or use if the medicine has been frozen. Protect from light and excessive heat. Store in the carton until use. Each single-dose pen can be kept at room temperature, not to exceed 30 degrees C (86 degrees F) for a total of 14 days, if needed. Throw away any unused medicine after the expiration date on the label. NOTE: This sheet is a summary. It may not cover all possible information. If you have questions about this  medicine, talk to your doctor, pharmacist, or health care provider.  2020 Elsevier/Gold Standard (2019-08-16 09:34:53)

## 2020-11-28 ENCOUNTER — Telehealth: Payer: Self-pay | Admitting: Critical Care Medicine

## 2020-11-28 ENCOUNTER — Telehealth: Payer: Self-pay

## 2020-11-28 NOTE — Telephone Encounter (Signed)
Contacted patient by phone to advise on upcoming local locations for Covid vaccines. Pt acknowledged they would visit the unit in the upcoming week.

## 2020-11-28 NOTE — Telephone Encounter (Signed)
Pt will get his first covid vaccine on 12-03-2020 at [facility] in [place]. Pt does not know which vaccine he will be getting

## 2020-11-29 ENCOUNTER — Other Ambulatory Visit: Payer: Self-pay | Admitting: Critical Care Medicine

## 2020-11-29 ENCOUNTER — Ambulatory Visit: Payer: Medicaid Other | Admitting: Orthopedic Surgery

## 2020-11-29 DIAGNOSIS — B1920 Unspecified viral hepatitis C without hepatic coma: Secondary | ICD-10-CM | POA: Insufficient documentation

## 2020-11-29 DIAGNOSIS — B182 Chronic viral hepatitis C: Secondary | ICD-10-CM

## 2020-11-29 LAB — COMPREHENSIVE METABOLIC PANEL
ALT: 29 IU/L (ref 0–44)
AST: 30 IU/L (ref 0–40)
Albumin/Globulin Ratio: 1.4 (ref 1.2–2.2)
Albumin: 4.2 g/dL (ref 4.0–5.0)
Alkaline Phosphatase: 97 IU/L (ref 44–121)
BUN/Creatinine Ratio: 18 (ref 9–20)
BUN: 16 mg/dL (ref 6–24)
Bilirubin Total: 0.2 mg/dL (ref 0.0–1.2)
CO2: 26 mmol/L (ref 20–29)
Calcium: 9.3 mg/dL (ref 8.7–10.2)
Chloride: 99 mmol/L (ref 96–106)
Creatinine, Ser: 0.88 mg/dL (ref 0.76–1.27)
GFR calc Af Amer: 120 mL/min/{1.73_m2} (ref >59)
GFR calc non Af Amer: 104 mL/min/{1.73_m2} (ref >59)
Globulin, Total: 3.1 g/dL (ref 1.5–4.5)
Glucose: 108 mg/dL — ABNORMAL HIGH (ref 65–99)
Potassium: 4.4 mmol/L (ref 3.5–5.2)
Sodium: 139 mmol/L (ref 134–144)
Total Protein: 7.3 g/dL (ref 6.0–8.5)

## 2020-11-29 LAB — HCV AB W REFLEX TO QUANT PCR: HCV Ab: >11 s/co ratio — ABNORMAL HIGH (ref 0.0–0.9)

## 2020-11-29 LAB — CBC WITH DIFFERENTIAL/PLATELET
Basophils Absolute: 0.1 10*3/uL (ref 0.0–0.2)
Basos: 1 %
EOS (ABSOLUTE): 0.5 10*3/uL — ABNORMAL HIGH (ref 0.0–0.4)
Eos: 5 %
Hematocrit: 40.6 % (ref 37.5–51.0)
Hemoglobin: 13.7 g/dL (ref 13.0–17.7)
Immature Grans (Abs): 0.1 10*3/uL (ref 0.0–0.1)
Immature Granulocytes: 1 %
Lymphocytes Absolute: 2.4 10*3/uL (ref 0.7–3.1)
Lymphs: 25 %
MCH: 28.5 pg (ref 26.6–33.0)
MCHC: 33.7 g/dL (ref 31.5–35.7)
MCV: 85 fL (ref 79–97)
Monocytes Absolute: 0.8 10*3/uL (ref 0.1–0.9)
Monocytes: 8 %
Neutrophils Absolute: 5.7 10*3/uL (ref 1.4–7.0)
Neutrophils: 60 %
Platelets: 269 10*3/uL (ref 150–450)
RBC: 4.8 x10E6/uL (ref 4.14–5.80)
RDW: 13.4 % (ref 11.6–15.4)
WBC: 9.5 10*3/uL (ref 3.4–10.8)

## 2020-11-29 LAB — LIPID PANEL
Chol/HDL Ratio: 3.2 ratio (ref 0.0–5.0)
Cholesterol, Total: 138 mg/dL (ref 100–199)
HDL: 43 mg/dL (ref >39)
LDL Chol Calc (NIH): 65 mg/dL (ref 0–99)
Triglycerides: 176 mg/dL — ABNORMAL HIGH (ref 0–149)
VLDL Cholesterol Cal: 30 mg/dL (ref 5–40)

## 2020-11-29 LAB — HCV RT-PCR, QUANT (NON-GRAPH)
HCV log10: 6.788 log10 IU/mL
Hepatitis C Quantitation: 6140000 IU/mL

## 2020-11-30 ENCOUNTER — Ambulatory Visit: Payer: Medicaid Other | Admitting: Otolaryngology

## 2020-12-03 ENCOUNTER — Ambulatory Visit: Payer: Medicaid Other | Admitting: Orthopedic Surgery

## 2020-12-04 ENCOUNTER — Telehealth: Payer: Self-pay

## 2020-12-04 ENCOUNTER — Encounter: Payer: Medicaid Other | Admitting: Internal Medicine

## 2020-12-04 NOTE — Telephone Encounter (Signed)
[facility] Patient Product/process development scientist completed.    The patient is uninsured and will need patient assistance for medication.  Patient Family Planning Waiver   We can complete the application and will need to meet with the patient for signatures and income documentation.  Clearance Coots, CPhT Specialty Pharmacy Patient [facility] Phone: [phone] Fax:  [phone]

## 2020-12-16 NOTE — Progress Notes (Deleted)
Subjective:    Patient ID: Chad Bautista, male    DOB: September 29, 1976, 45 y.o.   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 The patient currently is homeless and just recently was released from the criminal justice system in prison.  He was in the justice system due to multiple DUIs and trying to allude a police chase while intoxicated.  Patient does have a lifelong history of stress and anxiety and grew up in a home in which his father left him in early age she does not communicate with the father.  The patient currently is at the [facility] homeless shelter but is working on a potential apartment within the next 30 days.  Patient does smoke a half a pack a day of cigarettes.  He states he drinks about occasional can of beer 2 or 3 days a week.  When I met the patient the homeless shelter we initiated losartan HCT for hypertension and did obtain for the patient orthopedic appointment.  He has the upcoming appointment on February 8 for orthopedics.  Pain has been quite severe and has been managing this with meloxicam daily and I did give him a very short course of opioids to use only as needed.  He has not been overusing these opioids and [employer] showed no other prescribers.  The patient has no other medical concerns at this visit.  He is here for additional blood work and to establish a primary care   04/09/2020 Since last OV has had R THR. Overall the patient's pain is markedly improved and is able to ambulate without any assistance.  Patient is now in an independent apartment and has furniture as well.  He only notes some swelling in the thigh near the incision site.  He no longer is drinking alcohol.  He is weaning himself off the oxycodone  He has an  appointment in a week to have his sutures removed The patient does have a history of diabetes and note had a hemoglobin A1c of 6.8 in the hospital with blood sugars in the 120-150 range.  The patient had lost significant weight and had been on insulin in the past  05/24/2020 This patient is seen in return follow-up and had right total hip replacement anterior approach in April.  The patient's wound is now healed and his hip no longer is in pain however he has developed increased knee pain for the past several weeks.  The pain is in the medial aspect of the right knee.  He also has minimal pain in the left hip.  Note he had been using methamphetamine since early May due to depression.  He states he is no longer using this.  He was associating with the wrong people he says.  The wound of his hip replacement has healed.  He has been holding his hypertensive meds and his blood pressure on arrival was 184/117 The patient is applying for disability currently is not working but does have housing  07/16/2020 This is a follow-up telephone visit for this 45 year old male who has a history of methamphetamine use recent right hip replacement chronic pain syndrome.  Overall the patient continues to use methamphetamine multiple times a week.  He states he uses this drug because it is an expensive.  Patient  also recent developed cellulitis of the back of the neck was seen in the emergency room diagnosed with potential MRSA infection was placed on oral Bactrim which she is finishing at this time.  At that visit his blood sugar on arrival was 47 was brought down to 199 with insulin.  He is on Metformin 500 mg twice daily  Note he had taken prednisone he had leftover from previous inflammatory event for this recent neck issue.  Patient has no other complaints at this visit he does feel the area in the back of his neck is improving.  11/27/2020 This is a 45 year old male I previously saw in the [facility] and  saw a few times here and home health and wellness but have not seen this patient since this past summer.  Patient does have type 2 diabetes poorly controlled history of met amphetamine use, hypertension, vitamin D deficiency and foot pain and numbness.  Also has had repeated infections in the on his back and neck.  Patient on arrival has a blood sugar of 199 and A1c actually at 6.7  Patient is noted increase in ulceration at the base of his right foot plantar aspect he had walked a long distance and how shoes after his car broke down and wore this all ulceration in the foot.  He noticed he has had this problem before in the left foot.  Also had a ruptured eardrum went to the emergency room for this still has drainage from that left ear and difficulty hearing and pain his left hip continues to be chronically painful he has had a right hip replacement and this resolved all the pain in the right hip  The patient is yet to receive a Covid vaccine  12/19/19  Substance abuse ([place]) Obtain urine drug screen  Moderate episode of recurrent major depressive disorder ([place]) Referral made to clinical social worker  Uncontrolled type 2 diabetes mellitus with hyperglycemia ([place]) Uncontrolled diabetes due to lack of medication adherence  Resume Metformin at 1000 mg twice daily  Begin Trulicity 75 mcg weekly  Follow diabetic diet  Follow-up with clinical pharmacy  Type 2 diabetes mellitus with diabetic foot infection ([place]) Diabetic ulcer with diabetic foot infection secondary to type 2 diabetes and diabetic neuropathy  Referral to orthopedics Dr. Sharol Given  Begin Bactrim 1 twice daily double strength for 7 days  Dressing applied with topical triple antibiotic ointment  Ceftriaxone 500 mg IM given this visit  Neuropathy due to type 2 diabetes mellitus ([place]) Refill gabapentin  Essential hypertension Begin valsartan HCT for hypertension as hypertension poorly controlled  Hyperlipidemia associated  with type 2 diabetes mellitus ([place]) Hyperlipidemia secondary to type 2 diabetes begin atorvastatin check liver function  Tobacco use disorder    Current smoking consumption amount: 1 pack a day  Dicsussion on advise to quit smoking and smoking impacts: Impact on cardiovascular health  Patient's willingness to quit: Willing to quit  Methods to quit smoking discussed: Behavioral modification  Medication management of smoking session drugs discussed: Nicotine replacement  Resources provided:  AVS   Setting quit date not determined  Follow-up arranged 1 month   Time spent counseling the patient: 5 minutes     Greycen was seen today for foot pain and hip pain.  Diagnoses and all orders for this visit:  Uncontrolled type 2 diabetes mellitus with hyperglycemia (HCC) -     Glucose (CBG) -     HgB A1c -     Comprehensive metabolic panel -  Lipid panel  Type 2 diabetes mellitus with diabetic foot infection (HCC) -     cefTRIAXone (ROCEPHIN) injection 500 mg -     Ambulatory referral to Orthopedic Surgery -     Comprehensive metabolic panel -     CBC with Differential/Platelet  Need for hepatitis C screening test -     HCV Ab w Reflex to Quant PCR  Chronic otitis media with perforated tympanic membrane, right  Substance abuse (HCC)  Moderate episode of recurrent major depressive disorder (HCC)  Neuropathy due to type 2 diabetes mellitus ([place])  Essential hypertension  Hyperlipidemia associated with type 2 diabetes mellitus (HCC)  Tobacco use disorder  Other orders -     sulfamethoxazole-trimethoprim (BACTRIM DS) 800-160 MG tablet; Take 1 tablet by mouth 2 (two) times daily. -     Vitamin D, Ergocalciferol, (DRISDOL) 1.25 MG (50000 UNIT) CAPS capsule; Take 1 capsule (50,000 Units total) by mouth every 7 (seven) days. -     gabapentin (NEURONTIN) 300 MG capsule; Take 2 capsules (600 mg total) by mouth 3 (three) times daily. -     metFORMIN (GLUCOPHAGE) 500 MG  tablet; Take 2 tablets (1,000 mg total) by mouth 2 (two) times daily with a meal. -     mupirocin ointment (BACTROBAN) 2 %; Apply 1 application topically 2 (two) times daily. Apply to the affected area 2 times a day -     glucose blood (TRUE METRIX BLOOD GLUCOSE TEST) test strip; Use as instructed -     valsartan-hydrochlorothiazide (DIOVAN HCT) 160-12.5 MG tablet; Take 1 tablet by mouth daily. -     TRUEplus Lancets 28G MISC; Use to measure blood sugar twice a day -     Dulaglutide (TRULICITY) 6.96 VE/9.3YB SOPN; Inject 0.75 mg into the skin once a week. -     atorvastatin (LIPITOR) 20 MG tablet; Take 1 tablet (20 mg total) by mouth daily. -     Tdap vaccine greater than or equal to 7yo IM  Patient agrees to receive Covid vaccine patient given information as to where to follow-up on this  Fecal occult study given to screen for colon cancer  Administer tetanus vaccine this visit  pt missed ortho appt  Past Medical History:  Diagnosis Date  . Cocaine use 04/24/2020  . Diabetes mellitus without complication ([place])   . Head injury with loss of consciousness ([place])   . Hypertension      Family History  Problem Relation Age of Onset  . Heart disease Mother      Social History   Socioeconomic History  . Marital status: Single    Spouse name: Not on file  . Number of children: Not on file  . Years of education: Not on file  . Highest education level: Not on file  Occupational History  . Not on file  Tobacco Use  . Smoking status: Current Every Day Smoker    Packs/day: 0.50    Years: 30.00    Pack years: 15.00  . Smokeless tobacco: Never Used  Vaping Use  . Vaping Use: Never used  Substance and Sexual Activity  . Alcohol use: Not Currently    Comment: rare- beer  . Drug use: Not Currently    Types: Cocaine    Comment: hx of cocaine use, states that he has been clean five years  . Sexual activity: Not Currently  Other Topics Concern  . Not on file  Social History Narrative   . Not on file  Social Determinants of Health   Financial Resource Strain: Not on file  Food Insecurity: Not on file  Transportation Needs: Not on file  Physical Activity: Not on file  Stress: Not on file  Social Connections: Not on file  Intimate Partner Violence: Not on file     Allergies  Allergen Reactions  . Tramadol     Seizure     Outpatient Medications Prior to Visit  Medication Sig Dispense Refill  . atorvastatin (LIPITOR) 20 MG tablet Take 1 tablet (20 mg total) by mouth daily. 90 tablet 3  . Blood Glucose Monitoring Suppl (TRUE METRIX METER) w/Device KIT Use to measure blood sugar twice a day 1 kit 0  . Dulaglutide (TRULICITY) 0.81 KG/8.1EH SOPN Inject 0.75 mg into the skin once a week. 2 mL 4  . gabapentin (NEURONTIN) 300 MG capsule Take 2 capsules (600 mg total) by mouth 3 (three) times daily. 180 capsule 3  . glucose blood (TRUE METRIX BLOOD GLUCOSE TEST) test strip Use as instructed 100 each 12  . metFORMIN (GLUCOPHAGE) 500 MG tablet Take 2 tablets (1,000 mg total) by mouth 2 (two) times daily with a meal. 180 tablet 3  . mupirocin ointment (BACTROBAN) 2 % Apply 1 application topically 2 (two) times daily. Apply to the affected area 2 times a day 22 g 3  . sulfamethoxazole-trimethoprim (BACTRIM DS) 800-160 MG tablet Take 1 tablet by mouth 2 (two) times daily. 14 tablet 0  . TRUEplus Lancets 28G MISC Use to measure blood sugar twice a day 100 each 1  . valsartan-hydrochlorothiazide (DIOVAN HCT) 160-12.5 MG tablet Take 1 tablet by mouth daily. 90 tablet 2  . Vitamin D, Ergocalciferol, (DRISDOL) 1.25 MG (50000 UNIT) CAPS capsule Take 1 capsule (50,000 Units total) by mouth every 7 (seven) days. 12 capsule 5   No facility-administered medications prior to visit.      Review of Systems  Constitutional: Negative for fatigue and fever.  HENT: Positive for ear discharge, ear pain and hearing loss.        Perforated Left TM  Respiratory: Negative for cough, shortness  of breath and wheezing.   Cardiovascular: Negative for palpitations and leg swelling.  Gastrointestinal: Negative.   Genitourinary: Negative.   Musculoskeletal:       Left hip pain  Skin: Positive for wound.       R foot planter aspect wound  Neurological: Negative for dizziness and weakness.  Psychiatric/Behavioral: Positive for dysphoric mood and sleep disturbance. Negative for behavioral problems, decreased concentration and self-injury. The patient is nervous/anxious.        Objective:   Physical Exam    There were no vitals filed for this visit.  Gen: Pleasant, well-nourished, in no distress,  Flat affect  ENT: perforated R TM with purulence  mouth clear,  oropharynx clear, no postnasal drip  Neck: No JVD, no TMG, no carotid bruits  Lungs: No use of accessory muscles, no dullness to percussion, clear without rales or rhonchi  Cardiovascular: RRR, heart sounds normal, no murmur or gallops, no peripheral edema  Abdomen: soft and NT, no HSM,  BS normal  Musculoskeletal: No deformities, no cyanosis or clubbing  Neuro: alert, non focal  Skin: at plantar aspect in the R forefoot a half dollar coin sized ulceration seen with early purulence.  Pulses intact.  R foot swollen and warm    Assessment & Plan:  I personally reviewed all images and lab data in the [facility] system as well as any outside material  available during this office visit and agree with the  radiology impressions.   No problem-specific Assessment & Plan notes found for this encounter.   There are no diagnoses linked to this encounter.Patient agrees to receive Covid vaccine patient given information as to where to follow-up on this  Fecal occult study given to screen for colon cancer  Administer tetanus vaccine this visit

## 2020-12-18 ENCOUNTER — Ambulatory Visit: Payer: Medicaid Other | Admitting: Critical Care Medicine

## 2020-12-28 ENCOUNTER — Ambulatory Visit: Payer: Medicaid Other | Admitting: Otolaryngology

## 2020-12-28 ENCOUNTER — Encounter: Payer: Medicaid Other | Admitting: Infectious Diseases

## 2021-01-09 ENCOUNTER — Telehealth: Payer: Self-pay | Admitting: Critical Care Medicine

## 2021-01-09 MED FILL — ?ATORVASTATIN 20 MG TABLET: 20 | 30 days supply | Qty: 30 | Fill #1

## 2021-01-09 MED FILL — VALSARTAN-HCTZ 160-12.5 MG: 160-12.5 | 30 days supply | Qty: 30 | Fill #1

## 2021-01-09 MED FILL — GABAPENTIN 300 MG CAPSULE: 300 | 30 days supply | Qty: 180 | Fill #1

## 2021-01-31 MED FILL — METFORMIN HCL 500 MG TABS: 500 | 30 days supply | Qty: 120 | Fill #1

## 2021-01-31 MED FILL — VALSARTAN-HCTZ 160-12.5 MG: 160-12.5 | 30 days supply | Qty: 30 | Fill #1

## 2021-02-04 MED FILL — ?ATORVASTATIN 20 MG TABLET: 20 | 30 days supply | Qty: 30 | Fill #2

## 2021-02-04 MED FILL — GABAPENTIN 300 MG CAPSULE: 300 | 30 days supply | Qty: 180 | Fill #2

## 2021-03-04 ENCOUNTER — Telehealth: Payer: Self-pay | Admitting: Critical Care Medicine

## 2021-03-04 ENCOUNTER — Other Ambulatory Visit: Payer: Self-pay | Admitting: Critical Care Medicine

## 2021-03-04 MED ORDER — SULFAMETHOXAZOLE-TRIMETHOPRIM 800-160 MG PO TABS: 1.0000 | ORAL_TABLET | Freq: Two times a day (BID) | ORAL | 0 refills | Status: DC

## 2021-03-04 MED ORDER — MUPIROCIN 2 % EX OINT: 1.0000 "application " | TOPICAL_OINTMENT | Freq: Two times a day (BID) | CUTANEOUS | 3 refills | Status: DC

## 2021-03-04 MED FILL — MUPIROCIN 2% OINTMENT: 2 | 7 days supply | Qty: 22 | Fill #0

## 2021-03-04 MED FILL — SULFAMETHOXAZOLE-TMP DS TAB: 800-160 | 7 days supply | Qty: 14 | Fill #0

## 2021-03-04 NOTE — Telephone Encounter (Signed)
Pt c.o of foot infection.  Drainage    Will send ABX and need to get him in for direct exam soon.

## 2021-03-05 ENCOUNTER — Telehealth: Payer: Self-pay | Admitting: Critical Care Medicine

## 2021-03-05 MED FILL — ?TRULICITY 0.75 MG/0.5ML PE: 0.75 | 28 days supply | Qty: 2 | Fill #1

## 2021-03-05 MED FILL — ?METFORMIN HCL 500MG TABLET: 500 | 30 days supply | Qty: 120 | Fill #2

## 2021-03-05 MED FILL — GABAPENTIN 300 MG CAPSULE: 300 | 30 days supply | Qty: 180 | Fill #3

## 2021-03-05 MED FILL — VALSARTAN-HCTZ 160-12.5 MG: 160-12.5 | 30 days supply | Qty: 30 | Fill #2

## 2021-03-05 MED FILL — ?ATORVASTATIN 20 MG TABLET: 20 | 30 days supply | Qty: 30 | Fill #3

## 2021-03-05 NOTE — Telephone Encounter (Signed)
You can refer the patient to the mobile unit or place on [facility] schedule.

## 2021-03-05 NOTE — Telephone Encounter (Signed)
Copied from CRM [id number]. Topic: General - Other >> Mar 04, 2021  8:58 AM Gaetana Michaelis A wrote: Reason for CRM: Patient shares that they have a sore on their right foot and believes it may be infected  Patient would like antibiotics prescribed to them to the [facility] pharmacy  Patient has an appt to see PCP on 04/23/21 at 11 AM  Please contact to further advise if needed   Can this patient be seen virtually or should I refer to [facility]?

## 2021-03-16 ENCOUNTER — Other Ambulatory Visit: Payer: Self-pay

## 2021-03-26 NOTE — Telephone Encounter (Signed)
Called patient, connected with him and offered the mobile medicine unit. Patient declined the address for mobile medicine because he uses public transportation and it would be too hard to get to the mobile unit. I looked for a sooner available appointment than scheduled. Offered patient 2/7 @ 4:10 with Dr. Alvis Lemmings. Patient declined and stated he would keep his appointment with Dr. Delford Field.

## 2021-04-10 ENCOUNTER — Other Ambulatory Visit: Payer: Self-pay | Admitting: Critical Care Medicine

## 2021-04-10 MED ORDER — GABAPENTIN 300 MG PO CAPS
ORAL_CAPSULE | Freq: Three times a day (TID) | ORAL | 0 refills | Status: DC
  Filled 2021-04-10: qty 180, 30d supply, fill #0

## 2021-04-10 MED FILL — Mupirocin Oint 2%: CUTANEOUS | 10 days supply | Qty: 22 | Fill #0 | Status: CN

## 2021-04-10 MED FILL — Atorvastatin Calcium Tab 20 MG (Base Equivalent): ORAL | 30 days supply | Qty: 30 | Fill #0 | Status: CN

## 2021-04-10 NOTE — Telephone Encounter (Signed)
Requested Prescriptions  Pending Prescriptions Disp Refills  . gabapentin (NEURONTIN) 300 MG capsule 180 capsule 0    Sig: TAKE 2 CAPSULES (600 MG TOTAL) BY MOUTH 3 (THREE) TIMES DAILY.     Neurology: Anticonvulsants - gabapentin Passed - 04/10/2021 10:13 PM      Passed - Valid encounter within last 12 months    Recent Outpatient Visits          4 months ago Uncontrolled type 2 diabetes mellitus with hyperglycemia [facility])   [place] [facility] [place], Jeannett Senior L, RPH-CPP   4 months ago Uncontrolled type 2 diabetes mellitus with hyperglycemia [facility])   [place] [facility] Storm Frisk, MD   5 months ago Chronic left hip pain   [place] [facility] [place], Virginia J, NP   10 months ago Uncontrolled type 2 diabetes mellitus with hyperglycemia [facility])   [place] [facility] Storm Frisk, MD   1 year ago Uncontrolled type 2 diabetes mellitus with hyperglycemia [facility])    [facility] Storm Frisk, MD      Future Appointments            In 1 week Storm Frisk, MD [facility]

## 2021-04-11 ENCOUNTER — Other Ambulatory Visit: Payer: Self-pay

## 2021-04-12 ENCOUNTER — Other Ambulatory Visit: Payer: Self-pay

## 2021-04-18 ENCOUNTER — Other Ambulatory Visit: Payer: Self-pay

## 2021-04-22 ENCOUNTER — Telehealth: Payer: Self-pay | Admitting: Critical Care Medicine

## 2021-04-22 NOTE — Telephone Encounter (Signed)
Called patient and LVM advising him that his visit for tomorrow has been switched to virtual due to the provider being out of the office and only seeing patients virtually. Advised patient that if he had any questions or concerns regarding this appointment to give [place] a call back at [phone].   If patient prefers and in-person visit patient can be rescheduled for another day or can go to mobile medicine unit. Mobile unit will be located at [address] from 08-1214 and 2-5:30 for walk-in visits.

## 2021-04-23 ENCOUNTER — Other Ambulatory Visit: Payer: Self-pay

## 2021-04-23 ENCOUNTER — Ambulatory Visit: Payer: Self-pay | Attending: Critical Care Medicine | Admitting: Critical Care Medicine

## 2021-04-23 ENCOUNTER — Encounter: Payer: Self-pay | Admitting: Critical Care Medicine

## 2021-04-23 DIAGNOSIS — E559 Vitamin D deficiency, unspecified: Secondary | ICD-10-CM

## 2021-04-23 DIAGNOSIS — E1169 Type 2 diabetes mellitus with other specified complication: Secondary | ICD-10-CM

## 2021-04-23 DIAGNOSIS — E785 Hyperlipidemia, unspecified: Secondary | ICD-10-CM

## 2021-04-23 DIAGNOSIS — M87052 Idiopathic aseptic necrosis of left femur: Secondary | ICD-10-CM | POA: Insufficient documentation

## 2021-04-23 DIAGNOSIS — E1140 Type 2 diabetes mellitus with diabetic neuropathy, unspecified: Secondary | ICD-10-CM

## 2021-04-23 DIAGNOSIS — F1510 Other stimulant abuse, uncomplicated: Secondary | ICD-10-CM

## 2021-04-23 DIAGNOSIS — F331 Major depressive disorder, recurrent, moderate: Secondary | ICD-10-CM

## 2021-04-23 DIAGNOSIS — F1910 Other psychoactive substance abuse, uncomplicated: Secondary | ICD-10-CM

## 2021-04-23 DIAGNOSIS — I10 Essential (primary) hypertension: Secondary | ICD-10-CM

## 2021-04-23 DIAGNOSIS — F1190 Opioid use, unspecified, uncomplicated: Secondary | ICD-10-CM

## 2021-04-23 DIAGNOSIS — E1165 Type 2 diabetes mellitus with hyperglycemia: Secondary | ICD-10-CM

## 2021-04-23 MED ORDER — METFORMIN HCL 500 MG PO TABS
ORAL_TABLET | Freq: Two times a day (BID) | ORAL | 3 refills | Status: DC
  Filled 2021-04-23 – 2021-06-25 (×2): qty 120, 30d supply, fill #0

## 2021-04-23 MED ORDER — VALSARTAN-HYDROCHLOROTHIAZIDE 160-12.5 MG PO TABS
1.0000 | ORAL_TABLET | Freq: Every day | ORAL | 2 refills | Status: DC
  Filled 2021-04-23 – 2021-06-25 (×2): qty 30, 30d supply, fill #0

## 2021-04-23 MED ORDER — ATORVASTATIN CALCIUM 20 MG PO TABS
ORAL_TABLET | Freq: Every day | ORAL | 3 refills | Status: DC
  Filled 2021-04-23: qty 30, 30d supply, fill #0

## 2021-04-23 MED ORDER — VITAMIN D (ERGOCALCIFEROL) 1.25 MG (50000 UNIT) PO CAPS
ORAL_CAPSULE | ORAL | 5 refills | Status: DC
  Filled 2021-04-23: qty 12, 28d supply, fill #0

## 2021-04-23 MED ORDER — DULAGLUTIDE 0.75 MG/0.5ML ~~LOC~~ SOAJ
0.7500 mg | SUBCUTANEOUS | 4 refills | Status: DC
  Filled 2021-04-23 (×2): qty 2, 28d supply, fill #0

## 2021-04-23 MED ORDER — GABAPENTIN 300 MG PO CAPS
ORAL_CAPSULE | Freq: Three times a day (TID) | ORAL | 0 refills | Status: DC
  Filled 2021-04-23: qty 180, fill #0
  Filled 2021-05-09: qty 180, 30d supply, fill #0

## 2021-04-23 MED ORDER — TRUEPLUS LANCETS 28G MISC
1 refills | Status: AC
  Filled 2021-04-23: qty 100, 50d supply, fill #0

## 2021-04-23 MED ORDER — TRUE METRIX BLOOD GLUCOSE TEST VI STRP
ORAL_STRIP | 12 refills | Status: AC
  Filled 2021-04-23: qty 100, 50d supply, fill #0

## 2021-04-23 NOTE — Assessment & Plan Note (Signed)
Refill gabapentin 

## 2021-04-23 NOTE — Assessment & Plan Note (Signed)
Now actively using heroin and methamphetamine will need significant behavioral health counseling and substance use therapy

## 2021-04-23 NOTE — Progress Notes (Signed)
Subjective:    Patient ID: Chad Bautista, male    DOB: 06-12-1976, 45 y.o.   MRN: [id number] Virtual Visit via Telephone Note  I connected with Chad Bautista on 04/23/21 at 11:00 AM EDT by telephone and verified that I am speaking with the correct person using two identifiers.   Consent:  I discussed the limitations, risks, security and privacy concerns of performing an evaluation and management service by telephone and the availability of in person appointments. I also discussed with the patient that there may be a patient responsible charge related to this service. The patient expressed understanding and agreed to proceed.  Location of patient: Patient is in his car driving back from [place]  Location of provider: I am in my office  Persons participating in the televisit with the patient.   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 The patient currently is homeless and just recently was released from the criminal justice system in prison.  He was in the justice system due to multiple DUIs and trying to allude a police chase while intoxicated.  Patient does have a lifelong history of stress and anxiety and grew up in a home in which his father left him in early age she does not communicate with the father.  The patient currently is at the [facility] homeless shelter but is working on a potential apartment within the next 30 days.  Patient does smoke a half a pack a day of cigarettes.  He states he drinks about occasional can of beer 2 or 3 days a week.  When I met the patient the homeless shelter we initiated losartan HCT for hypertension and did obtain for the patient orthopedic appointment.  He has the upcoming appointment on  February 8 for orthopedics.  Pain has been quite severe and has been managing this with meloxicam daily and I did give him a very short course of opioids to use only as needed.  He has not been overusing these opioids and [employer] showed no other prescribers.  The patient has no other medical concerns at this visit.  He is here for additional blood work and to establish a primary care   04/09/2020 Since last OV has had R THR. Overall the patient's pain is markedly improved and is able to ambulate without any assistance.  Patient is now in an independent apartment and has furniture as well.  He only notes some swelling in the thigh near the incision site.  He no longer is drinking alcohol.  He is weaning himself off the oxycodone  He has an appointment in a week to have his sutures removed The patient does have a history of diabetes and note had a hemoglobin A1c of 6.8 in the hospital with blood sugars in the 120-150 range.  The patient had lost significant weight and had been on insulin in the past  05/24/2020 This patient is seen in return follow-up and had right total hip replacement anterior approach in April.  The patient's wound is now healed and his hip no longer is in pain however he has developed increased knee pain for the past several weeks.  The pain is in the medial aspect of the right knee.  He also has  minimal pain in the left hip.  Note he had been using methamphetamine since early May due to depression.  He states he is no longer using this.  He was associating with the wrong people he says.  The wound of his hip replacement has healed.  He has been holding his hypertensive meds and his blood pressure on arrival was 184/117 The patient is applying for disability currently is not working but does have housing  07/16/2020 This is a follow-up telephone visit for this 45 year old male who has a history of methamphetamine use recent right hip replacement chronic pain  syndrome.  Overall the patient continues to use methamphetamine multiple times a week.  He states he uses this drug because it is an expensive.  Patient also recent developed cellulitis of the back of the neck was seen in the emergency room diagnosed with potential MRSA infection was placed on oral Bactrim which she is finishing at this time.  At that visit his blood sugar on arrival was 47 was brought down to 199 with insulin.  He is on Metformin 500 mg twice daily  Note he had taken prednisone he had leftover from previous inflammatory event for this recent neck issue.  Patient has no other complaints at this visit he does feel the area in the back of his neck is improving.  11/27/2020 This is a 45 year old male I previously saw in the [facility] and saw a few times here and home health and wellness but have not seen this patient since this past summer.  Patient does have type 2 diabetes poorly controlled history of met amphetamine use, hypertension, vitamin D deficiency and foot pain and numbness.  Also has had repeated infections in the on his back and neck.  Patient on arrival has a blood sugar of 199 and A1c actually at 6.7  Patient is noted increase in ulceration at the base of his right foot plantar aspect he had walked a long distance and how shoes after his car broke down and wore this all ulceration in the foot.  He noticed he has had this problem before in the left foot.  Also had a ruptured eardrum went to the emergency room for this still has drainage from that left ear and difficulty hearing and pain his left hip continues to be chronically painful he has had a right hip replacement and this resolved all the pain in the right hip  The patient is yet to receive a Covid vaccine  04/23/2021   Past Medical History:  Diagnosis Date  . Cocaine use 04/24/2020  . Diabetes mellitus without complication ([place])   . Head injury with loss of consciousness ([place])   . Hypertension       Family History  Problem Relation Age of Onset  . Heart disease Mother      Social History   Socioeconomic History  . Marital status: Single    Spouse name: Not on file  . Number of children: Not on file  . Years of education: Not on file  . Highest education level: Not on file  Occupational History  . Not on file  Tobacco Use  . Smoking status: Current Every Day Smoker    Packs/day: 0.50    Years: 30.00    Pack years: 15.00  . Smokeless tobacco: Never Used  Vaping Use  . Vaping Use: Never used  Substance and Sexual Activity  . Alcohol use: Not Currently    Comment: rare- beer  .  Drug use: Not Currently    Types: Cocaine    Comment: hx of cocaine use, states that he has been clean five years  . Sexual activity: Not Currently  Other Topics Concern  . Not on file  Social History Narrative  . Not on file   Social Determinants of Health   Financial Resource Strain: Not on file  Food Insecurity: Not on file  Transportation Needs: Not on file  Physical Activity: Not on file  Stress: Not on file  Social Connections: Not on file  Intimate Partner Violence: Not on file     Allergies  Allergen Reactions  . Tramadol     Seizure     Outpatient Medications Prior to Visit  Medication Sig Dispense Refill  . Blood Glucose Monitoring Suppl (TRUE METRIX METER) w/Device KIT Use to measure blood sugar twice a day 1 kit 0  . oxyCODONE-acetaminophen (PERCOCET) 10-325 MG tablet Take 1 tablet by mouth every 6 (six) hours as needed.    Marland Kitchen atorvastatin (LIPITOR) 20 MG tablet TAKE 1 TABLET (20 MG TOTAL) BY MOUTH DAILY. 90 tablet 3  . Dulaglutide 0.75 MG/0.5ML SOPN INJECT 0.75 MG INTO THE SKIN ONCE A WEEK. 2 mL 4  . gabapentin (NEURONTIN) 300 MG capsule TAKE 2 CAPSULES (600 MG TOTAL) BY MOUTH 3 (THREE) TIMES DAILY. 180 capsule 0  . glucose blood (TRUE METRIX BLOOD GLUCOSE TEST) test strip Use as instructed 100 each 12  . metFORMIN (GLUCOPHAGE) 500 MG tablet TAKE 2 TABLETS (1,000 MG  TOTAL) BY MOUTH 2 (TWO) TIMES DAILY WITH A MEAL. 180 tablet 3  . mupirocin ointment (BACTROBAN) 2 % APPLY TO THE AFFECTED AREA 2 TIMES A DAY ON FOOT 22 g 3  . TRUEplus Lancets 28G MISC USE TO MEASURE BLOOD SUGAR TWICE A DAY 100 each 1  . valsartan-hydrochlorothiazide (DIOVAN-HCT) 160-12.5 MG tablet TAKE 1 TABLET BY MOUTH DAILY. 90 tablet 2  . sulfamethoxazole-trimethoprim (BACTRIM DS) 800-160 MG tablet TAKE 1 TABLET BY MOUTH 2 (TWO) TIMES DAILY. (Patient not taking: Reported on 04/23/2021) 14 tablet 0  . Vitamin D, Ergocalciferol, (DRISDOL) 1.25 MG (50000 UNIT) CAPS capsule TAKE 1 CAPSULE (50,000 UNITS TOTAL) BY MOUTH EVERY 7 (SEVEN) DAYS. (Patient not taking: Reported on 04/23/2021) 12 capsule 5   No facility-administered medications prior to visit.      Review of Systems  Constitutional: Negative for fatigue and fever.  HENT: Negative for ear discharge, ear pain and hearing loss.   Respiratory: Negative for cough, shortness of breath and wheezing.   Cardiovascular: Negative for palpitations and leg swelling.  Gastrointestinal: Negative.   Genitourinary: Negative.   Musculoskeletal:       Left hip pain  Skin: Negative for wound.  Neurological: Negative for dizziness and weakness.  Psychiatric/Behavioral: Positive for dysphoric mood and sleep disturbance. Negative for behavioral problems, decreased concentration and self-injury. The patient is nervous/anxious.        Objective:   Physical Exam    No exam this is a phone visit   Assessment & Plan:  I personally reviewed all images and lab data in the [facility] system as well as any outside material available during this office visit and agree with the  radiology impressions.   Avascular necrosis of bone of left hip ([place]) Recent hospitalization in [place] with diagnosis of left hip avascular necrosis femoral head he will need a similar hip replacement as he has had already on the right hip  We need to avoid opiates as he is currently  using heroin on the streets  Referral to orthopedics made  Uncontrolled type 2 diabetes mellitus with hyperglycemia ([place]) We will refill Trulicity and metformin  Neuropathy due to type 2 diabetes mellitus ([place]) Refill gabapentin  Hyperlipidemia associated with type 2 diabetes mellitus ([place]) Continue atorvastatin  Vitamin D deficiency Refill vitamin D  Substance abuse ([place]) Now actively using heroin and methamphetamine will need significant behavioral health counseling and substance use therapy  Moderate episode of recurrent major depressive disorder ([place]) We will attempt to get this patient connected to behavioral health   Diagnoses and all orders for this visit:  Avascular necrosis of bone of left hip ([place]) -     Ambulatory referral to Orthopedic Surgery  Methamphetamine use ([place])  Substance abuse ([place])  Essential hypertension  Heroin use  Uncontrolled type 2 diabetes mellitus with hyperglycemia ([place])  Neuropathy due to type 2 diabetes mellitus ([place])  Hyperlipidemia associated with type 2 diabetes mellitus ([place])  Vitamin D deficiency  Moderate episode of recurrent major depressive disorder ([place])  Other orders -     atorvastatin (LIPITOR) 20 MG tablet; TAKE 1 TABLET (20 MG TOTAL) BY MOUTH DAILY. -     Dulaglutide 0.75 MG/0.5ML SOPN; INJECT 0.75 MG INTO THE SKIN ONCE A WEEK. -     gabapentin (NEURONTIN) 300 MG capsule; TAKE 2 CAPSULES (600 MG TOTAL) BY MOUTH 3 (THREE) TIMES DAILY. -     glucose blood (TRUE METRIX BLOOD GLUCOSE TEST) test strip; Use as instructed -     metFORMIN (GLUCOPHAGE) 500 MG tablet; TAKE 2 TABLETS (1,000 MG TOTAL) BY MOUTH 2 (TWO) TIMES DAILY WITH A MEAL. -     TRUEplus Lancets 28G MISC; USE TO MEASURE BLOOD SUGAR TWICE A DAY -     valsartan-hydrochlorothiazide (DIOVAN-HCT) 160-12.5 MG tablet; TAKE 1 TABLET BY MOUTH DAILY. -     Vitamin D, Ergocalciferol, (DRISDOL) 1.25 MG (50000 UNIT) CAPS capsule; TAKE 1 CAPSULE (50,000 UNITS TOTAL) BY  MOUTH EVERY 7 (SEVEN) DAYS.    Spent 26 minutes going over this patient's history formulating a plan communicating with him by phone reviewing his records Follow Up Instructions: Patient knows a direct exam will occur in the next few weeks he will need clearly opiate and substance use counseling as an outpatient or even potentially inpatient rehab  Patient will need a referral back to orthopedics   I discussed the assessment and treatment plan with the patient. The patient was provided an opportunity to ask questions and all were answered. The patient agreed with the plan and demonstrated an understanding of the instructions.   The patient was advised to call back or seek an in-person evaluation if the symptoms worsen or if the condition fails to improve as anticipated.  I provided 26 minutes of non-face-to-face time during this encounter  including  median intraservice time , review of notes, labs, imaging, medications  and explaining diagnosis and management to the patient .    Asencion Noble, MD

## 2021-04-23 NOTE — Assessment & Plan Note (Signed)
We will refill Trulicity and metformin

## 2021-04-23 NOTE — Assessment & Plan Note (Signed)
Recent hospitalization in [place] with diagnosis of left hip avascular necrosis femoral head he will need a similar hip replacement as he has had already on the right hip  We need to avoid opiates as he is currently using heroin on the streets  Referral to orthopedics made

## 2021-04-23 NOTE — Assessment & Plan Note (Signed)
Refill vitamin D 

## 2021-04-23 NOTE — Assessment & Plan Note (Signed)
Continue atorvastatin

## 2021-04-23 NOTE — Assessment & Plan Note (Signed)
We will attempt to get this patient connected to behavioral health

## 2021-04-24 ENCOUNTER — Emergency Department (HOSPITAL_COMMUNITY): Payer: Medicaid Other

## 2021-04-24 ENCOUNTER — Other Ambulatory Visit: Payer: Self-pay

## 2021-04-24 ENCOUNTER — Emergency Department (HOSPITAL_COMMUNITY)
Admission: EM | Admit: 2021-04-24 | Discharge: 2021-04-24 | Disposition: A | Discharge status: Home / Self Care | Payer: Medicaid Other | Attending: Emergency Medicine | Admitting: Emergency Medicine

## 2021-04-24 DIAGNOSIS — Z96641 Presence of right artificial hip joint: Secondary | ICD-10-CM | POA: Insufficient documentation

## 2021-04-24 DIAGNOSIS — F17200 Nicotine dependence, unspecified, uncomplicated: Secondary | ICD-10-CM | POA: Insufficient documentation

## 2021-04-24 DIAGNOSIS — E785 Hyperlipidemia, unspecified: Secondary | ICD-10-CM | POA: Insufficient documentation

## 2021-04-24 DIAGNOSIS — Z7984 Long term (current) use of oral hypoglycemic drugs: Secondary | ICD-10-CM | POA: Insufficient documentation

## 2021-04-24 DIAGNOSIS — I10 Essential (primary) hypertension: Secondary | ICD-10-CM | POA: Insufficient documentation

## 2021-04-24 DIAGNOSIS — E1140 Type 2 diabetes mellitus with diabetic neuropathy, unspecified: Secondary | ICD-10-CM | POA: Insufficient documentation

## 2021-04-24 DIAGNOSIS — E1169 Type 2 diabetes mellitus with other specified complication: Secondary | ICD-10-CM | POA: Insufficient documentation

## 2021-04-24 DIAGNOSIS — M8700 Idiopathic aseptic necrosis of unspecified bone: Secondary | ICD-10-CM

## 2021-04-24 DIAGNOSIS — Z79899 Other long term (current) drug therapy: Secondary | ICD-10-CM | POA: Insufficient documentation

## 2021-04-24 MED ORDER — HYDROMORPHONE HCL 1 MG/ML IJ SOLN
1.0000 mg | Freq: Once | INTRAMUSCULAR | Status: AC
Start: 2021-04-24 — End: 2021-04-24
  Administered 2021-04-24: 1 mg via INTRAMUSCULAR
  Filled 2021-04-24: qty 1

## 2021-04-24 MED ORDER — OXYCODONE HCL 5 MG PO CAPS: 5.0000 mg | ORAL_CAPSULE | Freq: Four times a day (QID) | ORAL | 0 refills | Status: DC | PRN

## 2021-04-24 NOTE — ED Provider Notes (Signed)
[facility] EMERGENCY DEPARTMENT Provider Note   CSN: [id number] Arrival date & time: 04/24/21  1511     History Chief Complaint  Patient presents with  . Hip Pain    left    Geno Sydnor is a 45 y.o. male.  HPI    45 year old male comes in with chief complaint of hip pain.  Patient has history of cocaine, meth, opiate abuse.  He also has known history of avascular necrosis on the left side, the right side was replaced last year.  Patient reports that his left hip has been extremely painful the last few days.  He wants it to be fixed today.  The pain is constant, he is unable to bear full weight.  He has a wheelchair, and he has been staying on the first floor of the motel.  He tried to reach Dr. Erlinda Hong in his clinic today, but was advised to come to the ER.  He spoke with his PCP earlier today.  Patient reports that he was taking oxycodone, but it was not helping.  Past Medical History:  Diagnosis Date  . Cocaine use 04/24/2020  . Diabetes mellitus without complication ([place])   . Head injury with loss of consciousness ([place])   . Hypertension     Patient Active Problem List   Diagnosis Date Noted  . Avascular necrosis of bone of left hip ([place]) 04/23/2021  . Heroin use 04/23/2021  . Hepatitis C infection 11/29/2020  . Type 2 diabetes mellitus with diabetic foot infection ([place]) 11/27/2020  . Hyperlipidemia associated with type 2 diabetes mellitus ([place]) 11/27/2020  . Chronic pain of right knee 05/24/2020  . Methamphetamine use ([place]) 04/24/2020  . Substance abuse ([place]) 04/24/2020  . Uncontrolled type 2 diabetes mellitus with hyperglycemia ([place]) 04/09/2020  . Neuropathy due to type 2 diabetes mellitus ([place]) 04/09/2020  . 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  . Vitamin D deficiency 01/19/2020  . Tobacco use disorder 01/18/2020  . Alcohol use 01/18/2020  . Essential hypertension 01/18/2020  . Moderate episode of recurrent major depressive  disorder ([place]) 01/18/2020    Past Surgical History:  Procedure Laterality Date  . ANKLE FRACTURE SURGERY    . APPENDECTOMY    . NASAL SEPTUM SURGERY    . TONSILLECTOMY    . TOTAL HIP ARTHROPLASTY Right 04/02/2020   Procedure: RIGHT TOTAL HIP ARTHROPLASTY ANTERIOR APPROACH;  Surgeon: Leandrew Koyanagi, MD;  Location: [facility];  Service: Orthopedics;  Laterality: Right;       Family History  Problem Relation Age of Onset  . Heart disease Mother     Social History   Tobacco Use  . Smoking status: Current Every Day Smoker    Packs/day: 0.50    Years: 30.00    Pack years: 15.00  . Smokeless tobacco: Never Used  Vaping Use  . Vaping Use: Never used  Substance Use Topics  . Alcohol use: Not Currently    Comment: rare- beer  . Drug use: Not Currently    Types: Cocaine    Comment: hx of cocaine use, states that he has been clean five years    Home Medications Prior to Admission medications   Medication Sig Start Date End Date Taking? Authorizing Provider  oxycodone (OXY-IR) 5 MG capsule Take 1 capsule (5 mg total) by mouth every 6 (six) hours as needed. 04/24/21  Yes Madolyn Ackroyd, MD  atorvastatin (LIPITOR) 20 MG tablet TAKE 1 TABLET (20 MG TOTAL) BY MOUTH DAILY. 04/23/21  04/23/22  Elsie Stain, MD  Blood Glucose Monitoring Suppl (TRUE METRIX METER) w/Device KIT Use to measure blood sugar twice a day 04/09/20   Elsie Stain, MD  Dulaglutide 0.75 MG/0.5ML SOPN INJECT 0.75 MG INTO THE SKIN ONCE A WEEK. 04/23/21 04/23/22  Elsie Stain, MD  gabapentin (NEURONTIN) 300 MG capsule TAKE 2 CAPSULES (600 MG TOTAL) BY MOUTH 3 (THREE) TIMES DAILY. 04/23/21 04/23/22  Elsie Stain, MD  glucose blood (TRUE METRIX BLOOD GLUCOSE TEST) test strip Use as instructed 04/23/21   Elsie Stain, MD  metFORMIN (GLUCOPHAGE) 500 MG tablet TAKE 2 TABLETS (1,000 MG TOTAL) BY MOUTH 2 (TWO) TIMES DAILY WITH A MEAL. 04/23/21 04/23/22  Elsie Stain, MD  oxyCODONE-acetaminophen (PERCOCET) 10-325 MG  tablet Take 1 tablet by mouth every 6 (six) hours as needed. 04/13/21   [provider]  TRUEplus Lancets 28G MISC USE TO MEASURE BLOOD SUGAR TWICE A DAY 04/23/21 04/23/22  Elsie Stain, MD  valsartan-hydrochlorothiazide (DIOVAN-HCT) 160-12.5 MG tablet TAKE 1 TABLET BY MOUTH DAILY. 04/23/21 04/23/22  Elsie Stain, MD  Vitamin D, Ergocalciferol, (DRISDOL) 1.25 MG (50000 UNIT) CAPS capsule TAKE 1 CAPSULE (50,000 UNITS TOTAL) BY MOUTH EVERY 7 (SEVEN) DAYS. 04/23/21 04/23/22  Elsie Stain, MD    Allergies    Tramadol  Review of Systems   Review of Systems  Constitutional: Positive for activity change.  Musculoskeletal: Positive for arthralgias.  Skin: Negative for wound.  Neurological: Negative for numbness.  All other systems reviewed and are negative.   Physical Exam Updated Vital Signs BP (!) 165/94   Pulse (!) 109   Temp 98.4 F (36.9 C) (Oral)   Resp 17   SpO2 100%   Physical Exam Vitals and nursing note reviewed.  Constitutional:      Appearance: He is well-developed.  HENT:     Head: Atraumatic.  Cardiovascular:     Rate and Rhythm: Normal rate.  Pulmonary:     Effort: Pulmonary effort is normal.  Musculoskeletal:     Cervical back: Neck supple.  Skin:    General: Skin is warm.  Neurological:     Mental Status: He is alert and oriented to person, place, and time.     ED Results / Procedures / Treatments   Labs (all labs ordered are listed, but only abnormal results are displayed) Labs Reviewed - No data to display  EKG None  Radiology DG Hip Unilat W or Wo Pelvis 2-3 Views Left  Result Date: 04/24/2021 CLINICAL DATA:  Chronic left hip pain. Bilateral avascular necrosis. EXAM: DG HIP (WITH OR WITHOUT PELVIS) 2-3V LEFT COMPARISON:  04/02/2020 and 02/22/2020. FINDINGS: AP view of the pelvis and AP/frog leg views of the left hip. Right hip arthroplasty, without acute complication. Again identified is left femoral head avascular necrosis.  Interval femoral head flattening/collapse. Mild joint space narrowing involving primarily the weight-bearing surface. IMPRESSION: Progressive left femoral head avascular necrosis with developing collapse and secondary osteoarthritis. Steinberg stage 5. Electronically Signed   By: Abigail Miyamoto M.D.   On: 04/24/2021 16:28    Procedures Procedures   Medications Ordered in ED Medications  HYDROmorphone (DILAUDID) injection 1 mg (1 mg Intramuscular Given 04/24/21 1620)    ED Course  I have reviewed the triage vital signs and the nursing notes.  Pertinent labs & imaging results that were available during my care of the patient were reviewed by me and considered in my medical decision making (see chart for details).  MDM Rules/Calculators/A&P                          Pt comes in with cc of hip pain.  He has known history of avascular necrosis on the left side.  He also has history of polysubstance abuse, appears that he was recently admitted to health system in [place].  We did an x-ray, there is some progression of disease.  I discussed the case with Dr. Sharol Given -who discussed the case with Dr. Erlinda Hong -and informed me that patient needs to call the office for an appointment next week, they will try to electively replace his hip as soon as possible.  Patient has a wheelchair, we will give him crutches.  I will give him some pain medications, I reminded him to make sure that he does not abuse the opioids or mix it with street drugs.  [place] controlled substance data reviewed, last pickup was from [place].  Final Clinical Impression(s) / ED Diagnoses Final diagnoses:  Avascular necrosis ([place])    Rx / DC Orders ED Discharge Orders         Ordered    oxycodone (OXY-IR) 5 MG capsule  Every 6 hours PRN        04/24/21 Deneise Lever, MD 04/24/21 1906

## 2021-04-24 NOTE — ED Notes (Signed)
Patient transported to X-ray 

## 2021-04-24 NOTE — ED Triage Notes (Signed)
BIB [facility] after pt called to report increase left hip pain r/t to fall yesterday. Per EMS, pt [place] yesterday [place] and endores + LOC, EMS was called but Pt refused to be transported. Pt states pain is worse today. HX BIL chronic hip pain, hip spurs, R hip replacement, substance abuse, DM, HTN

## 2021-04-24 NOTE — Progress Notes (Signed)
Orthopedic Tech Progress Note Patient Details:  Chad Bautista 12/07/1976 [id number]  Ortho Devices Type of Ortho Device: Crutches Ortho Device/Splint Interventions: Ordered,Adjustment   Post Interventions Patient Tolerated: Well,Refused intervention Instructions Provided: Care of device   [name] 04/24/2021, 4:01 PM

## 2021-04-24 NOTE — Discharge Instructions (Addendum)
Please call Dr. Warren Danes office for an appointment next week. Use the crutches to avoid extra strain on the hip. Ice also helps with the swelling.

## 2021-04-24 NOTE — ED Notes (Signed)
Pt wheeled out of he ED in a wheel chair without difficulty.

## 2021-04-25 ENCOUNTER — Other Ambulatory Visit: Payer: Self-pay

## 2021-04-26 ENCOUNTER — Other Ambulatory Visit (HOSPITAL_COMMUNITY): Payer: Self-pay

## 2021-04-26 ENCOUNTER — Other Ambulatory Visit: Payer: Self-pay | Admitting: Critical Care Medicine

## 2021-04-26 MED ORDER — NALOXONE HCL 4 MG/0.1ML NA LIQD
NASAL | 0 refills | Status: AC
  Filled 2021-04-26: qty 2, 1d supply, fill #0

## 2021-04-26 MED ORDER — OXYCODONE-ACETAMINOPHEN 10-325 MG PO TABS
1.0000 | ORAL_TABLET | Freq: Four times a day (QID) | ORAL | 0 refills | Status: DC | PRN
  Filled 2021-04-26: qty 30, 8d supply, fill #0

## 2021-04-26 NOTE — Progress Notes (Signed)
Pt with L hip pain un resolved.  Pt never picked up the  oxy IRs from the  ED visit5/11  Pt asking for pain medication.  Super high risk given meth and heroin abuse in past  Has gabapentin   Has appt with me Monday  Will need new pain contract  Need drug screen

## 2021-04-28 NOTE — Progress Notes (Signed)
Subjective:    Patient ID: Chad Bautista, male    DOB: [birth year], 45 y.o.   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 The patient currently is homeless and just recently was released from the criminal justice system in prison.  He was in the justice system due to multiple DUIs and trying to allude a police chase while intoxicated.  Patient does have a lifelong history of stress and anxiety and grew up in a home in which his father left him in early age she does not communicate with the father.  The patient currently is at the [facility] homeless shelter but is working on a potential apartment within the next 30 days.  Patient does smoke a half a pack a day of cigarettes.  He states he drinks about occasional can of beer 2 or 3 days a week.  When I met the patient the homeless shelter we initiated losartan HCT for hypertension and did obtain for the patient orthopedic appointment.  He has the upcoming appointment on February 8 for orthopedics.  Pain has been quite severe and has been managing this with meloxicam daily and I did give him a very short course of opioids to use only as needed.  He has not been overusing these opioids and [employer] showed no other prescribers.  The patient has no other medical concerns at this visit.  He is here for additional blood work and to establish a primary care   04/09/2020 Since last OV has had R THR. Overall the patient's pain is markedly improved and is able to ambulate without any assistance.  Patient is now in an independent apartment and has furniture as well.  He only notes some swelling in the thigh near the incision site.  He no longer is drinking alcohol.  He is weaning himself off the oxycodone  He has an  appointment in a week to have his sutures removed The patient does have a history of diabetes and note had a hemoglobin A1c of 6.8 in the hospital with blood sugars in the 120-150 range.  The patient had lost significant weight and had been on insulin in the past  05/24/2020 This patient is seen in return follow-up and had right total hip replacement anterior approach in April.  The patient's wound is now healed and his hip no longer is in pain however he has developed increased knee pain for the past several weeks.  The pain is in the medial aspect of the right knee.  He also has minimal pain in the left hip.  Note he had been using methamphetamine since early May due to depression.  He states he is no longer using this.  He was associating with the wrong people he says.  The wound of his hip replacement has healed.  He has been holding his hypertensive meds and his blood pressure on arrival was 184/117 The patient is applying for disability currently is not working but does have housing  07/16/2020 This is a follow-up telephone visit for this 45 year old male who has a history of methamphetamine use recent right hip replacement chronic pain syndrome.  Overall the patient continues to use methamphetamine multiple times a week.  He states he uses this drug because it is an expensive.  Patient  also recent developed cellulitis of the back of the neck was seen in the emergency room diagnosed with potential MRSA infection was placed on oral Bactrim which she is finishing at this time.  At that visit his blood sugar on arrival was 47 was brought down to 199 with insulin.  He is on Metformin 500 mg twice daily  Note he had taken prednisone he had leftover from previous inflammatory event for this recent neck issue.  Patient has no other complaints at this visit he does feel the area in the back of his neck is improving.  11/27/2020 This is a 45 year old male I previously saw in the [facility] and  saw a few times here and home health and wellness but have not seen this patient since this past summer.  Patient does have type 2 diabetes poorly controlled history of met amphetamine use, hypertension, vitamin D deficiency and foot pain and numbness.  Also has had repeated infections in the on his back and neck.  Patient on arrival has a blood sugar of 199 and A1c actually at 6.7  Patient is noted increase in ulceration at the base of his right foot plantar aspect he had walked a long distance and how shoes after his car broke down and wore this all ulceration in the foot.  He noticed he has had this problem before in the left foot.  Also had a ruptured eardrum went to the emergency room for this still has drainage from that left ear and difficulty hearing and pain his left hip continues to be chronically painful he has had a right hip replacement and this resolved all the pain in the right hip  The patient is yet to receive a Covid vaccine  04/29/21 Patient comes in today last seen in December 2021.  Patient has history of diabetes hypertension chronic hepatitis C infection heroin use methamphetamine use  History of chronic anxiety and depression now with progression of left hip arthritis with avascular necrosis left hip  Patient's been out of the state and into several emergency room's with this condition  Patient now presents back in [place] wishing further care of the left hip  We did agree to give the patient a short course of opiates and is signing a pain contract this visit and obtaining urine drug screen  Patient knows if methamphetamine and heroin use continues we will not be able to care for him further and agree to this as he comes into the office today with his partner  Patient only has crutches at this time needs something more than this walker is provided    Past Medical History:  Diagnosis Date  . Cocaine use 04/24/2020  . Diabetes mellitus without complication ([place])    . Head injury with loss of consciousness ([place])   . Hypertension      Family History  Problem Relation Age of Onset  . Heart disease Mother      Social History   Socioeconomic History  . Marital status: Single    Spouse name: Not on file  . Number of children: Not on file  . Years of education: Not on file  . Highest education level: Not on file  Occupational History  . Not on file  Tobacco Use  . Smoking status: Current Every Day Smoker    Packs/day: 0.50    Years: 30.00    Pack years: 15.00  . Smokeless tobacco: Never Used  Vaping Use  . Vaping Use: Never used  Substance and Sexual Activity  . Alcohol use: Not Currently    Comment: rare- beer  . Drug use: Not Currently    Types: Cocaine    Comment: hx of cocaine use, states that he has been clean five years  . Sexual activity: Not Currently  Other Topics Concern  . Not on file  Social History Narrative  . Not on file   Social Determinants of Health   Financial Resource Strain: Not on file  Food Insecurity: Not on file  Transportation Needs: Not on file  Physical Activity: Not on file  Stress: Not on file  Social Connections: Not on file  Intimate Partner Violence: Not on file     Allergies  Allergen Reactions  . Tramadol     Seizure     Outpatient Medications Prior to Visit  Medication Sig Dispense Refill  . atorvastatin (LIPITOR) 20 MG tablet TAKE 1 TABLET (20 MG TOTAL) BY MOUTH DAILY. 90 tablet 3  . Dulaglutide 0.75 MG/0.5ML SOPN INJECT 0.75 MG INTO THE SKIN ONCE A WEEK. 2 mL 4  . gabapentin (NEURONTIN) 300 MG capsule TAKE 2 CAPSULES (600 MG TOTAL) BY MOUTH 3 (THREE) TIMES DAILY. 180 capsule 0  . metFORMIN (GLUCOPHAGE) 500 MG tablet TAKE 2 TABLETS (1,000 MG TOTAL) BY MOUTH 2 (TWO) TIMES DAILY WITH A MEAL. 180 tablet 3  . oxyCODONE-acetaminophen (PERCOCET) 10-325 MG tablet Take 1 tablet by mouth every 6 (six) hours as needed for up to 8 days. 30 tablet 0  . valsartan-hydrochlorothiazide (DIOVAN-HCT)  160-12.5 MG tablet TAKE 1 TABLET BY MOUTH DAILY. 90 tablet 2  . Blood Glucose Monitoring Suppl (TRUE METRIX METER) w/Device KIT Use to measure blood sugar twice a day 1 kit 0  . glucose blood (TRUE METRIX BLOOD GLUCOSE TEST) test strip Use as instructed 100 each 12  . naloxone (NARCAN) nasal spray 4 mg/0.1 mL Partner to Use as directed for signs of opioid overdose 2 each 0  . TRUEplus Lancets 28G MISC USE TO MEASURE BLOOD SUGAR TWICE A DAY 100 each 1  . Vitamin D, Ergocalciferol, (DRISDOL) 1.25 MG (50000 UNIT) CAPS capsule TAKE 1 CAPSULE (50,000 UNITS TOTAL) BY MOUTH EVERY 7 (SEVEN) DAYS. 12 capsule 5   No facility-administered medications prior to visit.      Review of Systems  Constitutional: Negative for fatigue and fever.  HENT: Negative for ear discharge, ear pain and hearing loss.   Respiratory: Negative for cough, shortness of breath and wheezing.   Cardiovascular: Negative for palpitations and leg swelling.  Gastrointestinal: Negative.   Genitourinary: Negative.   Musculoskeletal: Positive for gait problem and joint swelling.       Left hip pain  Skin: Negative for wound.  Neurological: Negative for dizziness and weakness.  Psychiatric/Behavioral: Positive for dysphoric mood and sleep disturbance. Negative for behavioral problems, decreased concentration and self-injury. The patient is nervous/anxious.        Objective:   Physical Exam    Vitals:   04/29/21 1453  BP: (!) 150/99  Pulse: (!) 106  Resp: 15  Temp: 97.8 F (36.6 C)  SpO2: 98%  Weight: 202 lb 12.8 oz (92 kg)    Gen: Pleasant, well-nourished, in no distress,  normal affect  ENT: No lesions,  mouth clear,  oropharynx clear, no postnasal drip  Neck: No JVD, no TMG, no carotid bruits  Lungs: No use of accessory muscles, no dullness to percussion, clear without rales or rhonchi  Cardiovascular: RRR, heart sounds normal, no murmur or gallops,  no peripheral edema  Abdomen: soft and NT, no HSM,  BS  normal  Musculoskeletal: No deformities, no cyanosis or clubbing, significant decreased range of motion left hip  Neuro: alert, non focal  Skin: Warm, no lesions or rashes     Assessment & Plan:  I personally reviewed all images and lab data in the [facility] system as well as any outside material available during this office visit and agree with the  radiology impressions.   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  Diagnoses and all orders for this visit:  Avascular necrosis of bone of left hip ([place]) -     Ambulatory referral to Orthopedic Surgery  Chronic pain syndrome -     [id number]+Oxyco+Alc+Crt-Bund  Uncontrolled type 2 diabetes mellitus with hyperglycemia (HCC) -     Comprehensive metabolic panel -     CBC with Differential/Platelet -     Hemoglobin A1c -     Lipid panel  Moderate episode of recurrent major depressive disorder (HCC)  Essential hypertension  Substance abuse ([place])  Other orders -     methocarbamol (ROBAXIN) 500 MG tablet; Take 1 tablet (500 mg total) by mouth every 6 (six) hours as needed for muscle spasms. -     melatonin 5 MG TABS; Take 2 tablets (10 mg total) by mouth at bedtime.

## 2021-04-29 ENCOUNTER — Ambulatory Visit: Payer: Self-pay | Attending: Critical Care Medicine | Admitting: Critical Care Medicine

## 2021-04-29 ENCOUNTER — Other Ambulatory Visit: Payer: Self-pay

## 2021-04-29 ENCOUNTER — Encounter: Payer: Self-pay | Admitting: Critical Care Medicine

## 2021-04-29 VITALS — BP 150/99 | HR 106 | Temp 97.8°F | Resp 15 | Wt 202.8 lb

## 2021-04-29 DIAGNOSIS — I10 Essential (primary) hypertension: Secondary | ICD-10-CM

## 2021-04-29 DIAGNOSIS — M87052 Idiopathic aseptic necrosis of left femur: Secondary | ICD-10-CM

## 2021-04-29 DIAGNOSIS — F331 Major depressive disorder, recurrent, moderate: Secondary | ICD-10-CM

## 2021-04-29 DIAGNOSIS — G894 Chronic pain syndrome: Secondary | ICD-10-CM

## 2021-04-29 DIAGNOSIS — F1910 Other psychoactive substance abuse, uncomplicated: Secondary | ICD-10-CM

## 2021-04-29 DIAGNOSIS — E1165 Type 2 diabetes mellitus with hyperglycemia: Secondary | ICD-10-CM

## 2021-04-29 MED ORDER — METHOCARBAMOL 500 MG PO TABS
500.0000 mg | ORAL_TABLET | Freq: Four times a day (QID) | ORAL | 1 refills | Status: DC | PRN
  Filled 2021-04-29: qty 90, 23d supply, fill #0

## 2021-04-29 MED ORDER — MELATONIN 5 MG PO TABS
10.0000 mg | ORAL_TABLET | Freq: Every day | ORAL | 0 refills | Status: DC
  Filled 2021-04-29: qty 60, 30d supply, fill #0

## 2021-04-29 NOTE — Assessment & Plan Note (Signed)
Ongoing substance abuse with heroin and methamphetamine  Patient signed pain contract urine drug screen obtained  Patient knows if continued methamphetamine and heroin use occurs will not be able to provide further care for him

## 2021-04-29 NOTE — Assessment & Plan Note (Signed)
Continue metformin and Trulicity follow-up A1c

## 2021-04-29 NOTE — Progress Notes (Signed)
Follow up.

## 2021-04-29 NOTE — Assessment & Plan Note (Signed)
Refilled valsartan HCT

## 2021-04-29 NOTE — Patient Instructions (Addendum)
Referral back to orthopedics will be made  Methocarbamol Robaxin is given 4 times daily as needed for muscle spasm  Use the walker to help ambulate  Stop by the lab for complete set of blood draws for diabetes screening and assessments  Return to see Dr. Delford Field 6 weeks

## 2021-04-29 NOTE — Assessment & Plan Note (Signed)
Avascular necrosis left hip referral back to orthopedics made

## 2021-04-30 ENCOUNTER — Other Ambulatory Visit: Payer: Self-pay

## 2021-05-01 ENCOUNTER — Other Ambulatory Visit (HOSPITAL_COMMUNITY): Payer: Self-pay

## 2021-05-02 ENCOUNTER — Other Ambulatory Visit (HOSPITAL_COMMUNITY): Payer: Self-pay

## 2021-05-02 ENCOUNTER — Other Ambulatory Visit: Payer: Self-pay | Admitting: Critical Care Medicine

## 2021-05-02 MED ORDER — OXYCODONE-ACETAMINOPHEN 10-325 MG PO TABS
1.0000 | ORAL_TABLET | Freq: Four times a day (QID) | ORAL | 0 refills | Status: DC | PRN
  Filled 2021-05-02: qty 40, 5d supply, fill #0

## 2021-05-02 NOTE — Progress Notes (Signed)
Dose increase due to severe pain

## 2021-05-08 ENCOUNTER — Other Ambulatory Visit: Payer: Self-pay

## 2021-05-08 ENCOUNTER — Encounter: Payer: Self-pay | Admitting: Orthopaedic Surgery

## 2021-05-08 ENCOUNTER — Ambulatory Visit (INDEPENDENT_AMBULATORY_CARE_PROVIDER_SITE_OTHER): Payer: Self-pay

## 2021-05-08 ENCOUNTER — Ambulatory Visit (INDEPENDENT_AMBULATORY_CARE_PROVIDER_SITE_OTHER): Payer: Self-pay | Admitting: Orthopaedic Surgery

## 2021-05-08 VITALS — Ht 75.0 in | Wt 202.0 lb

## 2021-05-08 DIAGNOSIS — Z96641 Presence of right artificial hip joint: Secondary | ICD-10-CM

## 2021-05-08 DIAGNOSIS — M87052 Idiopathic aseptic necrosis of left femur: Secondary | ICD-10-CM

## 2021-05-08 MED ORDER — PREDNISONE 10 MG PO TABS
ORAL_TABLET | ORAL | 0 refills | Status: DC
Start: 2021-05-08 — End: 2021-05-14
  Filled 2021-05-08: qty 21, 6d supply, fill #0

## 2021-05-08 MED ORDER — COLCHICINE 0.6 MG PO TABS
0.6000 mg | ORAL_TABLET | Freq: Two times a day (BID) | ORAL | 0 refills | Status: DC | PRN
  Filled 2021-05-08: qty 20, 10d supply, fill #0

## 2021-05-08 NOTE — Progress Notes (Signed)
Office Visit Note   Patient: Chad Bautista           Date of Birth: Mar 26, 1976           MRN: [id number] Visit Date: 05/08/2021              Requested by: Storm Frisk, MD [address] [place],  [place] [address] PCP: Storm Frisk, MD   Assessment & Plan: Visit Diagnoses:  1. Avascular necrosis of bone of left hip (HCC)   2. Status post total replacement of right hip     Plan: In terms of the right hip replacement him has done well and he has no complaints.  For the left hip his avascular necrosis is certainly progressed with femoral head collapse which would understandably cause severe pain.  He is medically and socially challenging in terms of surgical risk.  I have sent in prednisone and colchicine to treat his acute gout attack.  We will draw A1c and prealbumin levels today.  He is well aware that we will obtain a urine drug screen the day of surgery and he understands the criteria under which we would cancel his surgery.  Does sound like he has stable housing now which will certainly help with recuperation.  Questions encouraged and answered.  We will obtain the necessary clearances from his primary care doctor prior to scheduling surgery.  Total face to face encounter time was greater than 25 minutes and over half of this time was spent in counseling and/or coordination of care.  Follow-Up Instructions: Return for Postop.   Orders:  Orders Placed This Encounter  Procedures  . XR Pelvis 1-2 Views  . HgB A1c  . Prealbumin  . Uric acid   Meds ordered this encounter  Medications  . predniSONE (DELTASONE) 10 MG tablet    Sig: take 6 tabs by mouth for 1 day and then 5 tabs for 1 day and then 4 tabs for 1 day and then 3 tabs for 1 day and then 2 tabs for 1 day and then 1 tab for 1 day    Dispense:  21 tablet    Refill:  0  . colchicine 0.6 MG tablet    Sig: Take 1 tablet (0.6 mg total) by mouth 2 (two) times daily as needed.    Dispense:  20 tablet    Refill:   0      Procedures: No procedures performed   Clinical Data: No additional findings.   Subjective: Chief Complaint  Patient presents with  . Left Hip - Follow-up    AVN    Chad Bautista is a 45 year old gentleman who comes in for severe left hip pain since the end of April when it acutely got worse.  He has been ambulating in wheelchair for long distances.  We did do his right hip replacement a little over a year ago for avascular necrosis.  He had an uneventful postoperative course.  He was lost to follow-up.  Briefly Chad Bautista suffers from substance and psychiatric issues that poses a challenge in terms of perioperative surgical risk as well as postoperative rehabilitation.  He continues to use heroin and methamphetamines and alcohol.Marland Kitchen  He presents today with his girlfriend.  He does have a history of gout sounds like he may be having a gout attack in his left foot today.   Review of Systems  Constitutional: Negative.   All other systems reviewed and are negative.    Objective: Vital Signs: Ht 6'  3" (1.905 m)   Wt 202 lb (91.6 kg)   BMI 25.25 kg/m   Physical Exam Vitals and nursing note reviewed.  Constitutional:      Appearance: He is well-developed.  Pulmonary:     Effort: Pulmonary effort is normal.  Abdominal:     Palpations: Abdomen is soft.  Skin:    General: Skin is warm.  Neurological:     Mental Status: He is alert and oriented to person, place, and time.  Psychiatric:        Behavior: Behavior normal.        Thought Content: Thought content normal.        Judgment: Judgment normal.     Ortho Exam Right hip shows a fully healed surgical scar.  Good range of motion without pain. Left hip shows a severe pain with any attempted range of motion.  Slight leg length discrepancy.  Decreased strength secondary to pain and guarding. Specialty Comments:  No specialty comments available.  Imaging: XR Pelvis 1-2 Views  Result Date: 05/08/2021 Stable right total hip  replacement without any complications.  Collapse of left femoral head with sclerotic lesions consistent with avascular necrosis.    PMFS History: Patient Active Problem List   Diagnosis Date Noted  . Chronic pain syndrome 04/29/2021  . Avascular necrosis of bone of left hip (HCC) 04/23/2021  . Heroin use 04/23/2021  . Hepatitis C infection 11/29/2020  . Hyperlipidemia associated with type 2 diabetes mellitus (HCC) 11/27/2020  . Chronic pain of right knee 05/24/2020  . Methamphetamine use (HCC) 04/24/2020  . Substance abuse (HCC) 04/24/2020  . Uncontrolled type 2 diabetes mellitus with hyperglycemia (HCC) 04/09/2020  . Neuropathy due to type 2 diabetes mellitus (HCC) 04/09/2020  . 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  . Vitamin D deficiency 01/19/2020  . Tobacco use disorder 01/18/2020  . Alcohol use 01/18/2020  . Essential hypertension 01/18/2020  . Moderate episode of recurrent major depressive disorder (HCC) 01/18/2020   Past Medical History:  Diagnosis Date  . Cocaine use 04/24/2020  . Diabetes mellitus without complication (HCC)   . Head injury with loss of consciousness (HCC)   . Hypertension     Family History  Problem Relation Age of Onset  . Heart disease Mother     Past Surgical History:  Procedure Laterality Date  . ANKLE FRACTURE SURGERY    . APPENDECTOMY    . NASAL SEPTUM SURGERY    . TONSILLECTOMY    . TOTAL HIP ARTHROPLASTY Right 04/02/2020   Procedure: RIGHT TOTAL HIP ARTHROPLASTY ANTERIOR APPROACH;  Surgeon: Tarry Kos, MD;  Location: [facility] OR;  Service: Orthopedics;  Laterality: Right;   Social History   Occupational History  . Not on file  Tobacco Use  . Smoking status: Current Every Day Smoker    Packs/day: 0.50    Years: 30.00    Pack years: 15.00  . Smokeless tobacco: Never Used  Vaping Use  . Vaping Use: Never used  Substance and Sexual Activity  . Alcohol use: Not Currently    Comment: rare- beer  . Drug use: Not  Currently    Types: Cocaine    Comment: hx of cocaine use, states that he has been clean five years  . Sexual activity: Not Currently

## 2021-05-09 ENCOUNTER — Other Ambulatory Visit: Payer: Self-pay

## 2021-05-09 ENCOUNTER — Other Ambulatory Visit (HOSPITAL_COMMUNITY): Payer: Self-pay

## 2021-05-09 ENCOUNTER — Telehealth: Payer: Self-pay | Admitting: Critical Care Medicine

## 2021-05-09 MED ORDER — OXYCODONE-ACETAMINOPHEN 10-325 MG PO TABS
1.0000 | ORAL_TABLET | Freq: Four times a day (QID) | ORAL | 0 refills | Status: DC | PRN
  Filled 2021-05-09 (×2): qty 40, 5d supply, fill #0

## 2021-05-09 NOTE — Telephone Encounter (Signed)
Asking for pain med refill  PDMP checked  No other prescribers. Not early ,  No evidence diversion

## 2021-05-14 ENCOUNTER — Encounter: Payer: Self-pay | Admitting: Critical Care Medicine

## 2021-05-14 ENCOUNTER — Telehealth: Payer: Self-pay | Admitting: Critical Care Medicine

## 2021-05-14 ENCOUNTER — Other Ambulatory Visit (HOSPITAL_COMMUNITY): Payer: Self-pay

## 2021-05-14 LAB — OPIATES CONFIRMATION, URINE
Codeine: NEGATIVE
Hydrocodone: NEGATIVE
Hydromorphone: NEGATIVE
Morphine Confirm: 2993 ng/mL
Morphine: POSITIVE — AB
Opiates: POSITIVE ng/mL — AB

## 2021-05-14 LAB — DRUG SCREEN 764883 11+OXYCO+ALC+CRT-BUND
BENZODIAZ UR QL: NEGATIVE ng/mL
Barbiturate: NEGATIVE ng/mL
Cannabinoid Quant, Ur: NEGATIVE ng/mL
Cocaine (Metabolite): NEGATIVE ng/mL
Creatinine: 239.7 mg/dL (ref 20.0–300.0)
Ethanol: NEGATIVE %
Meperidine: NEGATIVE ng/mL
Methadone Screen, Urine: NEGATIVE ng/mL
Oxycodone/Oxymorphone, Urine: NEGATIVE ng/mL
Phencyclidine: NEGATIVE ng/mL
Propoxyphene: NEGATIVE ng/mL
Tramadol: NEGATIVE ng/mL
pH, Urine: 5.5 (ref 4.5–8.9)

## 2021-05-14 LAB — DRUG PROFILE 799016
Amphetamine GC/MS Conf: 1434 ng/mL
Amphetamine: POSITIVE — AB
Amphetamines: POSITIVE — AB
Methamphetamine: POSITIVE — AB

## 2021-05-14 LAB — D/L METHAMPHETAMINE
D Methamphetamine: 96 %
L Methamphetamine: 4 %
Methamphetamine Quant, Ur: >3000 ng/mL

## 2021-05-14 MED ORDER — OXYCODONE-ACETAMINOPHEN 10-325 MG PO TABS
1.0000 | ORAL_TABLET | Freq: Four times a day (QID) | ORAL | 0 refills | Status: DC | PRN
  Filled 2021-05-14: qty 40, 10d supply, fill #0

## 2021-05-14 MED ORDER — PREDNISONE 10 MG (21) PO TBPK
ORAL_TABLET | ORAL | 0 refills | Status: DC
  Filled 2021-05-14: qty 21, 6d supply, fill #0

## 2021-05-14 MED ORDER — COLCHICINE 0.6 MG PO TABS
0.6000 mg | ORAL_TABLET | Freq: Two times a day (BID) | ORAL | 0 refills | Status: AC | PRN
  Filled 2021-05-14: qty 20, 10d supply, fill #0

## 2021-05-14 NOTE — Telephone Encounter (Signed)
Needs coverage for gout medications

## 2021-05-15 ENCOUNTER — Other Ambulatory Visit: Payer: Self-pay

## 2021-05-20 ENCOUNTER — Other Ambulatory Visit: Payer: Self-pay

## 2021-05-20 ENCOUNTER — Other Ambulatory Visit: Payer: Self-pay | Admitting: Critical Care Medicine

## 2021-05-20 ENCOUNTER — Other Ambulatory Visit (HOSPITAL_COMMUNITY): Payer: Self-pay

## 2021-05-20 MED ORDER — OXYCODONE-ACETAMINOPHEN 10-325 MG PO TABS
1.0000 | ORAL_TABLET | Freq: Three times a day (TID) | ORAL | 0 refills | Status: DC | PRN
  Filled 2021-05-20: qty 40, 14d supply, fill #0
  Filled 2021-05-20: qty 40, 13d supply, fill #0

## 2021-05-20 MED ORDER — OXYCODONE-ACETAMINOPHEN 10-325 MG PO TABS
1.0000 | ORAL_TABLET | Freq: Four times a day (QID) | ORAL | 0 refills | Status: DC | PRN
  Filled 2021-05-20: qty 40, 10d supply, fill #0

## 2021-05-21 ENCOUNTER — Other Ambulatory Visit (HOSPITAL_COMMUNITY): Payer: Self-pay

## 2021-05-29 ENCOUNTER — Telehealth: Payer: Self-pay | Admitting: Critical Care Medicine

## 2021-05-29 NOTE — Telephone Encounter (Signed)
I return Pt call, unable to contact him

## 2021-05-29 NOTE — Telephone Encounter (Signed)
Copied from CRM [id number]. Topic: General - Other >> May 23, 2021  2:35 PM Pawlus, Maxine Glenn A wrote: Reason for CRM: Pt had some questions regarding the [facility] card and requested a call back from [place].

## 2021-05-30 ENCOUNTER — Other Ambulatory Visit: Payer: Self-pay | Admitting: Critical Care Medicine

## 2021-05-30 ENCOUNTER — Other Ambulatory Visit (HOSPITAL_COMMUNITY): Payer: Self-pay

## 2021-05-30 MED ORDER — OXYCODONE-ACETAMINOPHEN 10-325 MG PO TABS
1.0000 | ORAL_TABLET | Freq: Three times a day (TID) | ORAL | 0 refills | Status: DC | PRN
  Filled 2021-05-30: qty 40, 13d supply, fill #0

## 2021-05-30 NOTE — Telephone Encounter (Signed)
Requested medication (s) are due for refill today:  yes   Requested medication (s) are on the active medication list: yes   Last refill:  05/21/2021  Future visit scheduled: yes   Notes to clinic: this refill cannot be delegated    Requested Prescriptions  Pending Prescriptions Disp Refills   oxyCODONE-acetaminophen (PERCOCET) 10-325 MG tablet 40 tablet 0    Sig: Take 1 tablet by mouth every 8 (eight) hours as needed for up to 13 days for pain.      Not Delegated - Analgesics:  Opioid Agonist Combinations Failed - 05/30/2021  8:54 AM      Failed - This refill cannot be delegated      Passed - Urine Drug Screen completed in last 360 days      Passed - Valid encounter within last 6 months    Recent Outpatient Visits           1 month ago Avascular necrosis of bone of left hip [facility])   [facility] Storm Frisk, MD   1 month ago Avascular necrosis of bone of left hip [facility])   [facility] Storm Frisk, MD   6 months ago Uncontrolled type 2 diabetes mellitus with hyperglycemia [facility])   [facility] [place], Cornelius Moras, RPH-CPP   6 months ago Uncontrolled type 2 diabetes mellitus with hyperglycemia [facility])   [place] [facility] Storm Frisk, MD   7 months ago Chronic left hip pain   [facility] [place], [place], NP       Future Appointments             In 2 weeks Delford Field Charlcie Cradle, MD [facility]

## 2021-05-31 ENCOUNTER — Other Ambulatory Visit (HOSPITAL_COMMUNITY): Payer: Self-pay

## 2021-06-07 ENCOUNTER — Other Ambulatory Visit (HOSPITAL_COMMUNITY): Payer: Self-pay

## 2021-06-07 ENCOUNTER — Other Ambulatory Visit: Payer: Self-pay | Admitting: Critical Care Medicine

## 2021-06-07 MED ORDER — OXYCODONE-ACETAMINOPHEN 10-325 MG PO TABS: 1.0000 | ORAL_TABLET | Freq: Three times a day (TID) | ORAL | 0 refills | Status: DC | PRN

## 2021-06-07 NOTE — Telephone Encounter (Signed)
Requested medication (s) are due for refill today:  no  Requested medication (s) are on the active medication list:yes   Last refill: 05/31/2021  Future visit scheduled: yes   Notes to clinic: this refill cannot be delegated    Requested Prescriptions  Pending Prescriptions Disp Refills   oxyCODONE-acetaminophen (PERCOCET) 10-325 MG tablet 40 tablet 0    Sig: Take 1 tablet by mouth every 8 (eight) hours as needed for up to 13 days for pain.      Not Delegated - Analgesics:  Opioid Agonist Combinations Failed - 06/07/2021 12:04 PM      Failed - This refill cannot be delegated      Passed - Urine Drug Screen completed in last 360 days      Passed - Valid encounter within last 6 months    Recent Outpatient Visits           1 month ago Avascular necrosis of bone of left hip [facility])   [facility] Storm Frisk, MD   1 month ago Avascular necrosis of bone of left hip [facility])   [facility] Storm Frisk, MD   6 months ago Uncontrolled type 2 diabetes mellitus with hyperglycemia [facility])   [facility] [place], Cornelius Moras, RPH-CPP   6 months ago Uncontrolled type 2 diabetes mellitus with hyperglycemia [facility])   [facility] Storm Frisk, MD   7 months ago Chronic left hip pain   [facility] Rema Fendt, NP       Future Appointments             In 6 days Storm Frisk, MD [facility]

## 2021-06-13 ENCOUNTER — Ambulatory Visit: Payer: Medicaid Other | Admitting: Critical Care Medicine

## 2021-06-13 NOTE — Progress Notes (Deleted)
Subjective:    Patient ID: Chad Bautista, male    DOB: 01/06/1976, 45 y.o.   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 The patient currently is homeless and just recently was released from the criminal justice system in prison.  He was in the justice system due to multiple DUIs and trying to allude a police chase while intoxicated.  Patient does have a lifelong history of stress and anxiety and grew up in a home in which his father left him in early age she does not communicate with the father.  The patient currently is at the [place] homeless shelter but is working on a potential apartment within the next 30 days.  Patient does smoke a half a pack a day of cigarettes.  He states he drinks about occasional can of beer 2 or 3 days a week.  When I met the patient the homeless shelter we initiated losartan HCT for hypertension and did obtain for the patient orthopedic appointment.  He has the upcoming appointment on February 8 for orthopedics.  Pain has been quite severe and has been managing this with meloxicam daily and I did give him a very short course of opioids to use only as needed.  He has not been overusing these opioids and [employer] showed no other prescribers.  The patient has no other medical concerns at this visit.  He is here for additional blood work and to establish a primary care   04/09/2020 Since last OV has had R THR. Overall the patient's pain is markedly improved and is able to ambulate without any assistance.  Patient is now in an independent apartment and has furniture as well.  He only notes some swelling in the thigh near the incision site.  He no longer is drinking alcohol.  He is weaning himself off the oxycodone  He has an  appointment in a week to have his sutures removed The patient does have a history of diabetes and note had a hemoglobin A1c of 6.8 in the hospital with blood sugars in the 120-150 range.  The patient had lost significant weight and had been on insulin in the past  05/24/2020 This patient is seen in return follow-up and had right total hip replacement anterior approach in April.  The patient's wound is now healed and his hip no longer is in pain however he has developed increased knee pain for the past several weeks.  The pain is in the medial aspect of the right knee.  He also has minimal pain in the left hip.  Note he had been using methamphetamine since early May due to depression.  He states he is no longer using this.  He was associating with the wrong people he says.  The wound of his hip replacement has healed.  He has been holding his hypertensive meds and his blood pressure on arrival was 184/117 The patient is applying for disability currently is not working but does have housing  07/16/2020 This is a follow-up telephone visit for this 45 year old male who has a history of methamphetamine use recent right hip replacement chronic pain syndrome.  Overall the patient continues to use methamphetamine multiple times a week.  He states he uses this drug because it is an expensive.  Patient  also recent developed cellulitis of the back of the neck was seen in the emergency room diagnosed with potential MRSA infection was placed on oral Bactrim which she is finishing at this time.  At that visit his blood sugar on arrival was 47 was brought down to 199 with insulin.  He is on Metformin 500 mg twice daily  Note he had taken prednisone he had leftover from previous inflammatory event for this recent neck issue.  Patient has no other complaints at this visit he does feel the area in the back of his neck is improving.  11/27/2020 This is a 45 year old male I previously saw in the [facility] and  saw a few times here and home health and wellness but have not seen this patient since this past summer.  Patient does have type 2 diabetes poorly controlled history of met amphetamine use, hypertension, vitamin D deficiency and foot pain and numbness.  Also has had repeated infections in the on his back and neck.  Patient on arrival has a blood sugar of 199 and A1c actually at 6.7  Patient is noted increase in ulceration at the base of his right foot plantar aspect he had walked a long distance and how shoes after his car broke down and wore this all ulceration in the foot.  He noticed he has had this problem before in the left foot.  Also had a ruptured eardrum went to the emergency room for this still has drainage from that left ear and difficulty hearing and pain his left hip continues to be chronically painful he has had a right hip replacement and this resolved all the pain in the right hip  The patient is yet to receive a Covid vaccine  04/29/21 Patient comes in today last seen in December 2021.  Patient has history of diabetes hypertension chronic hepatitis C infection heroin use methamphetamine use  History of chronic anxiety and depression now with progression of left hip arthritis with avascular necrosis left hip  Patient's been out of the state and into several emergency room's with this condition  Patient now presents back in [place] wishing further care of the left hip  We did agree to give the patient a short course of opiates and is signing a pain contract this visit and obtaining urine drug screen  Patient knows if methamphetamine and heroin use continues we will not be able to care for him further and agree to this as he comes into the office today with his partner  Patient only has crutches at this time needs something more than this walker is provided    Past Medical History:  Diagnosis Date   Cocaine use 04/24/2020   Diabetes mellitus without complication ([place])     Head injury with loss of consciousness ([place])    Hypertension      Family History  Problem Relation Age of Onset   Heart disease Mother      Social History   Socioeconomic History   Marital status: Single    Spouse name: Not on file   Number of children: Not on file   Years of education: Not on file   Highest education level: Not on file  Occupational History   Not on file  Tobacco Use   Smoking status: Every Day    Packs/day: 0.50    Years: 30.00    Pack years: 15.00    Types: Cigarettes   Smokeless tobacco: Never  Vaping Use   Vaping Use:  Never used  Substance and Sexual Activity   Alcohol use: Not Currently    Comment: rare- beer   Drug use: Not Currently    Types: Cocaine    Comment: hx of cocaine use, states that he has been clean five years   Sexual activity: Not Currently  Other Topics Concern   Not on file  Social History Narrative   Not on file   Social Determinants of Health   Financial Resource Strain: Not on file  Food Insecurity: Not on file  Transportation Needs: Not on file  Physical Activity: Not on file  Stress: Not on file  Social Connections: Not on file  Intimate Partner Violence: Not on file     Allergies  Allergen Reactions   Tramadol     Seizure     Outpatient Medications Prior to Visit  Medication Sig Dispense Refill   atorvastatin (LIPITOR) 20 MG tablet TAKE 1 TABLET (20 MG TOTAL) BY MOUTH DAILY. 90 tablet 3   Blood Glucose Monitoring Suppl (TRUE METRIX METER) w/Device KIT Use to measure blood sugar twice a day 1 kit 0   colchicine 0.6 MG tablet Take 1 tablet (0.6 mg total) by mouth 2 (two) times daily as needed. 20 tablet 0   Dulaglutide 0.75 MG/0.5ML SOPN INJECT 0.75 MG INTO THE SKIN ONCE A WEEK. 2 mL 4   gabapentin (NEURONTIN) 300 MG capsule TAKE 2 CAPSULES (600 MG TOTAL) BY MOUTH 3 (THREE) TIMES DAILY. 180 capsule 0   glucose blood (TRUE METRIX BLOOD GLUCOSE TEST) test strip Use as instructed 100 each 12   melatonin 5 MG  TABS Take 2 tablets (10 mg total) by mouth at bedtime. 60 tablet 0   metFORMIN (GLUCOPHAGE) 500 MG tablet TAKE 2 TABLETS (1,000 MG TOTAL) BY MOUTH 2 (TWO) TIMES DAILY WITH A MEAL. 180 tablet 3   methocarbamol (ROBAXIN) 500 MG tablet Take 1 tablet (500 mg total) by mouth every 6 (six) hours as needed for muscle spasms. 90 tablet 1   naloxone (NARCAN) nasal spray 4 mg/0.1 mL Partner to Use as directed for signs of opioid overdose 2 each 0   oxyCODONE-acetaminophen (PERCOCET) 10-325 MG tablet Take 1 tablet by mouth every 8 (eight) hours as needed for up to 13 days for pain. 60 tablet 0   predniSONE (STERAPRED UNI-PAK 21 TAB) 10 MG (21) TBPK tablet take as directed on package 21 tablet 0   TRUEplus Lancets 28G MISC USE TO MEASURE BLOOD SUGAR TWICE A DAY 100 each 1   valsartan-hydrochlorothiazide (DIOVAN-HCT) 160-12.5 MG tablet TAKE 1 TABLET BY MOUTH DAILY. 90 tablet 2   Vitamin D, Ergocalciferol, (DRISDOL) 1.25 MG (50000 UNIT) CAPS capsule TAKE 1 CAPSULE (50,000 UNITS TOTAL) BY MOUTH EVERY 7 (SEVEN) DAYS. 12 capsule 5   No facility-administered medications prior to visit.      Review of Systems  Musculoskeletal:  Positive for gait problem and joint swelling.      Objective:   Physical Exam    There were no vitals filed for this visit.   Gen: Pleasant, well-nourished, in no distress,  normal affect  ENT: No lesions,  mouth clear,  oropharynx clear, no postnasal drip  Neck: No JVD, no TMG, no carotid bruits  Lungs: No use of accessory muscles, no dullness to percussion, clear without rales or rhonchi  Cardiovascular: RRR, heart sounds normal, no murmur or gallops, no peripheral edema  Abdomen: soft and NT, no HSM,  BS normal  Musculoskeletal: No deformities, no cyanosis or clubbing,  significant decreased range of motion left hip  Neuro: alert, non focal  Skin: Warm, no lesions or rashes     Assessment & Plan:  I personally reviewed all images and lab data in the [facility] system as  well as any outside material available during this office visit and agree with the  radiology impressions.   No problem-specific Assessment & Plan notes found for this encounter.   There are no diagnoses linked to this encounter.

## 2021-06-20 ENCOUNTER — Other Ambulatory Visit: Payer: Self-pay | Admitting: Critical Care Medicine

## 2021-06-20 ENCOUNTER — Other Ambulatory Visit (HOSPITAL_COMMUNITY): Payer: Self-pay

## 2021-06-20 MED ORDER — OXYCODONE-ACETAMINOPHEN 10-325 MG PO TABS
1.0000 | ORAL_TABLET | Freq: Three times a day (TID) | ORAL | 0 refills | Status: DC | PRN
  Filled 2021-06-20: qty 40, 14d supply, fill #0

## 2021-06-20 NOTE — Telephone Encounter (Signed)
Wanting pain med refills.  Missed appt this week due to no transportation.  He cannot walk severe hip disease  Pls reschedule him asap,  his phone is not working ,  you can call his next door neighbors phone, he calls me on this phone:   [phone]  he is working on getting his phone repaired.  Also can we get him [facility] for the clinic appt: adding jane   I refilled the pain meds  [place] database checked   he is due a drug screen when he comes to the office. Let the pt know his meds went to [facility]   He needs Left hip replacement asap, needs OV with labs with me before he can get hip replacement per ortho

## 2021-06-20 NOTE — Telephone Encounter (Signed)
Call placed to patient # [phone], to inform him of meds sent to [facility] to discuss scheduling scheduling [facility] to his appointment with Dr Delford Field 06/25/2021. Message left with call back requested.  Galin - can you please try to call him again and scheduled the ride for Tuesday 7/12.  thanks

## 2021-06-20 NOTE — Telephone Encounter (Signed)
Requested medication (s) are due for refill today: yes  Requested medication (s) are on the active medication list:  yes   Last refill:  05/31/2021  Future visit scheduled: yes   Notes to clinic:  this refill cannot be delegated    Requested Prescriptions  Pending Prescriptions Disp Refills   oxyCODONE-acetaminophen (PERCOCET) 10-325 MG tablet 40 tablet 0    Sig: Take 1 tablet by mouth every 8 (eight) hours as needed for up to 13 days for pain.      Not Delegated - Analgesics:  Opioid Agonist Combinations Failed - 06/20/2021 11:31 AM      Failed - This refill cannot be delegated      Passed - Urine Drug Screen completed in last 360 days      Passed - Valid encounter within last 6 months    Recent Outpatient Visits           1 month ago Avascular necrosis of bone of left hip [facility])   [facility] Storm Frisk, MD   1 month ago Avascular necrosis of bone of left hip [facility])   [facility] [facility] Storm Frisk, MD   6 months ago Uncontrolled type 2 diabetes mellitus with hyperglycemia [facility])   [facility] [place], Cornelius Moras, RPH-CPP   6 months ago Uncontrolled type 2 diabetes mellitus with hyperglycemia [facility])   [facility] Storm Frisk, MD   8 months ago Chronic left hip pain   [facility] Rema Fendt, NP       Future Appointments             In 1 month Delford Field Charlcie Cradle, MD [facility]

## 2021-06-21 ENCOUNTER — Other Ambulatory Visit (HOSPITAL_COMMUNITY): Payer: Self-pay

## 2021-06-21 ENCOUNTER — Other Ambulatory Visit: Payer: Self-pay | Admitting: Critical Care Medicine

## 2021-06-21 MED ORDER — OXYCODONE-ACETAMINOPHEN 10-325 MG PO TABS
1.0000 | ORAL_TABLET | Freq: Four times a day (QID) | ORAL | 0 refills | Status: DC | PRN
  Filled 2021-06-21 (×2): qty 40, 10d supply, fill #0

## 2021-06-24 ENCOUNTER — Telehealth: Payer: Self-pay

## 2021-06-24 NOTE — Telephone Encounter (Signed)
Call placed to patient regarding transportation to his appointment tomorrow. He explained that he is having difficulty ambulating and needs transportation even though he lives close to the clinic.  He is currently ambulating with crutches. He confirmed his address and said that he now has a phone and can best be reached at # [phone]  Call placed to [facility].  Spoke to [place] and requested a ride to appointment tomorrow. He said that the patient is already registered with them and he will call the patient and review the protocol for scheduling rides.  Provided Ephriam Knuckles with the updated address and phone number

## 2021-06-25 ENCOUNTER — Encounter: Payer: Self-pay | Admitting: Critical Care Medicine

## 2021-06-25 ENCOUNTER — Other Ambulatory Visit: Payer: Self-pay

## 2021-06-25 ENCOUNTER — Telehealth: Payer: Self-pay | Admitting: Critical Care Medicine

## 2021-06-25 ENCOUNTER — Other Ambulatory Visit: Payer: Self-pay | Admitting: Critical Care Medicine

## 2021-06-25 ENCOUNTER — Ambulatory Visit: Payer: Self-pay | Attending: Critical Care Medicine | Admitting: Critical Care Medicine

## 2021-06-25 VITALS — BP 143/90 | HR 94 | Temp 98.9°F | Wt 196.0 lb

## 2021-06-25 DIAGNOSIS — G894 Chronic pain syndrome: Secondary | ICD-10-CM

## 2021-06-25 DIAGNOSIS — F1910 Other psychoactive substance abuse, uncomplicated: Secondary | ICD-10-CM

## 2021-06-25 DIAGNOSIS — M25561 Pain in right knee: Secondary | ICD-10-CM

## 2021-06-25 DIAGNOSIS — I10 Essential (primary) hypertension: Secondary | ICD-10-CM

## 2021-06-25 DIAGNOSIS — E1165 Type 2 diabetes mellitus with hyperglycemia: Secondary | ICD-10-CM

## 2021-06-25 DIAGNOSIS — M87052 Idiopathic aseptic necrosis of left femur: Secondary | ICD-10-CM

## 2021-06-25 DIAGNOSIS — G8929 Other chronic pain: Secondary | ICD-10-CM

## 2021-06-25 DIAGNOSIS — E1140 Type 2 diabetes mellitus with diabetic neuropathy, unspecified: Secondary | ICD-10-CM

## 2021-06-25 MED ORDER — GABAPENTIN 300 MG PO CAPS
ORAL_CAPSULE | Freq: Three times a day (TID) | ORAL | 0 refills | Status: AC
  Filled 2021-06-25: qty 180, 30d supply, fill #0

## 2021-06-25 MED ORDER — ATORVASTATIN CALCIUM 20 MG PO TABS
ORAL_TABLET | Freq: Every day | ORAL | 3 refills | Status: AC
  Filled 2021-06-25: qty 30, 30d supply, fill #0

## 2021-06-25 MED ORDER — VITAMIN D (ERGOCALCIFEROL) 1.25 MG (50000 UNIT) PO CAPS
ORAL_CAPSULE | ORAL | 5 refills | Status: AC
  Filled 2021-06-25: qty 12, 84d supply, fill #0

## 2021-06-25 MED ORDER — DULAGLUTIDE 0.75 MG/0.5ML ~~LOC~~ SOAJ
0.7500 mg | SUBCUTANEOUS | 4 refills | Status: AC
  Filled 2021-06-25: qty 2, 28d supply, fill #0

## 2021-06-25 MED ORDER — VALSARTAN-HYDROCHLOROTHIAZIDE 160-12.5 MG PO TABS
1.0000 | ORAL_TABLET | Freq: Every day | ORAL | 2 refills | Status: AC
  Filled 2021-06-25: qty 30, 30d supply, fill #0

## 2021-06-25 MED ORDER — METFORMIN HCL 500 MG PO TABS
ORAL_TABLET | Freq: Two times a day (BID) | ORAL | 3 refills | Status: AC
  Filled 2021-06-25: qty 120, 30d supply, fill #0

## 2021-06-25 MED ORDER — GABAPENTIN 300 MG PO CAPS
ORAL_CAPSULE | Freq: Three times a day (TID) | ORAL | 0 refills | Status: DC
  Filled 2021-06-25: qty 180, 30d supply, fill #0

## 2021-06-25 NOTE — Telephone Encounter (Signed)
Pt struggling with intermittent cocaine use.  Can you help him find substance use resources.  Needs to be clean from cocaine before he can get Left hip replacement

## 2021-06-25 NOTE — Assessment & Plan Note (Signed)
Will check hemoglobin A1c metabolic panel lipid panel  Will renew Trulicity and metformin

## 2021-06-25 NOTE — Assessment & Plan Note (Signed)
Plan refill of gabapentin

## 2021-06-25 NOTE — Patient Instructions (Signed)
I need to be connecting you with our clinical social worker for further counseling whose name is Asante  Refills on all your medications were sent to our pharmacy they will put it on account for you to pick up today this includes the Trulicity which you should resume weekly  We discussed the importance of abstinence from cocaine and discussing with the orthopedist this is the biggest barrier for usual continued substance use so as long as the drug screens are positive you may not be able to qualify for the hip replacement  This also is an issue for the ongoing pain medication prescription to begin writing for you we will not be able to continue these if you continue to use cocaine  I am going to do a telephone visit with you again in about 2 weeks and you are going to practice abstinence and connect with the clinical social worker on this  I am also going to have my case manager contact you and see if she can assist you with getting the orange card processed  Return visit with Dr. Delford Field will be in 2 weeks and will be a virtual visit over the phone

## 2021-06-25 NOTE — Assessment & Plan Note (Signed)
Brief conversation with the patient's orthopedic surgeon was had and until he quits cocaine completely he cannot be considered for planned surgery he also would benefit in getting the orange card

## 2021-06-25 NOTE — Progress Notes (Signed)
Subjective:    Patient ID: Chad Bautista, male    DOB: 01/06/1976, 45 y.o.   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 The patient currently is homeless and just recently was released from the criminal justice system in prison.  He was in the justice system due to multiple DUIs and trying to allude a police chase while intoxicated.  Patient does have a lifelong history of stress and anxiety and grew up in a home in which his father left him in early age she does not communicate with the father.  The patient currently is at the [facility] homeless shelter but is working on a potential apartment within the next 30 days.  Patient does smoke a half a pack a day of cigarettes.  He states he drinks about occasional can of beer 2 or 3 days a week.  When I met the patient the homeless shelter we initiated losartan HCT for hypertension and did obtain for the patient orthopedic appointment.  He has the upcoming appointment on February 8 for orthopedics.  Pain has been quite severe and has been managing this with meloxicam daily and I did give him a very short course of opioids to use only as needed.  He has not been overusing these opioids and [employer] showed no other prescribers.  The patient has no other medical concerns at this visit.  He is here for additional blood work and to establish a primary care   04/09/2020 Since last OV has had R THR. Overall the patient's pain is markedly improved and is able to ambulate without any assistance.  Patient is now in an independent apartment and has furniture as well.  He only notes some swelling in the thigh near the incision site.  He no longer is drinking alcohol.  He is weaning himself off the oxycodone  He has an  appointment in a week to have his sutures removed The patient does have a history of diabetes and note had a hemoglobin A1c of 6.8 in the hospital with blood sugars in the 120-150 range.  The patient had lost significant weight and had been on insulin in the past  05/24/2020 This patient is seen in return follow-up and had right total hip replacement anterior approach in April.  The patient's wound is now healed and his hip no longer is in pain however he has developed increased knee pain for the past several weeks.  The pain is in the medial aspect of the right knee.  He also has minimal pain in the left hip.  Note he had been using methamphetamine since early May due to depression.  He states he is no longer using this.  He was associating with the wrong people he says.  The wound of his hip replacement has healed.  He has been holding his hypertensive meds and his blood pressure on arrival was 184/117 The patient is applying for disability currently is not working but does have housing  07/16/2020 This is a follow-up telephone visit for this 45 year old male who has a history of methamphetamine use recent right hip replacement chronic pain syndrome.  Overall the patient continues to use methamphetamine multiple times a week.  He states he uses this drug because it is an expensive.  Patient  also recent developed cellulitis of the back of the neck was seen in the emergency room diagnosed with potential MRSA infection was placed on oral Bactrim which she is finishing at this time.  At that visit his blood sugar on arrival was 47 was brought down to 199 with insulin.  He is on Metformin 500 mg twice daily  Note he had taken prednisone he had leftover from previous inflammatory event for this recent neck issue.  Patient has no other complaints at this visit he does feel the area in the back of his neck is improving.  11/27/2020 This is a 45 year old male I previously saw in the [place] clinic and  saw a few times here and home health and wellness but have not seen this patient since this past summer.  Patient does have type 2 diabetes poorly controlled history of met amphetamine use, hypertension, vitamin D deficiency and foot pain and numbness.  Also has had repeated infections in the on his back and neck.  Patient on arrival has a blood sugar of 199 and A1c actually at 6.7  Patient is noted increase in ulceration at the base of his right foot plantar aspect he had walked a long distance and how shoes after his car broke down and wore this all ulceration in the foot.  He noticed he has had this problem before in the left foot.  Also had a ruptured eardrum went to the emergency room for this still has drainage from that left ear and difficulty hearing and pain his left hip continues to be chronically painful he has had a right hip replacement and this resolved all the pain in the right hip  The patient is yet to receive a Covid vaccine  04/29/21 Patient comes in today last seen in December 2021.  Patient has history of diabetes hypertension chronic hepatitis C infection heroin use methamphetamine use  History of chronic anxiety and depression now with progression of left hip arthritis with avascular necrosis left hip  Patient's been out of the state and into several emergency room's with this condition  Patient now presents back in [place] wishing further care of the left hip  We did agree to give the patient a short course of opiates and is signing a pain contract this visit and obtaining urine drug screen  Patient knows if methamphetamine and heroin use continues we will not be able to care for him further and agree to this as he comes into the office today with his partner  Patient only has crutches at this time needs something more than this walker is provided  06/25/2021 This pAtient is seen in return follow-up face and face and is in need of a left hip replacement.  However the  patient continues to have cocaine use.  He last used several days ago.  This is a barrier that is preventing orthopedics from proceeding with surgery.  Patient is also on chronic Percocets to the pain management he is can receive another drug screen today if it is positive I told the patient will be challenging for [place] to continue to provide the pain medicine for him unless he is drug-free.  He states his blood sugar at home has been 110-1 20 and he is compliant with his medication at home.  He is still smoking half pack a day of cigarettes.  He has continued to lose some weight.  Patient lives alone in an apartment.   Past Medical History:  Diagnosis Date  Cocaine use 04/24/2020   Diabetes mellitus without complication (HCC)    Head injury with loss of consciousness ([place])    Hypertension      Family History  Problem Relation Age of Onset   Heart disease Mother      Social History   Socioeconomic History   Marital status: Single    Spouse name: Not on file   Number of children: Not on file   Years of education: Not on file   Highest education level: Not on file  Occupational History   Not on file  Tobacco Use   Smoking status: Every Day    Packs/day: 0.50    Years: 30.00    Pack years: 15.00    Types: Cigarettes   Smokeless tobacco: Never  Vaping Use   Vaping Use: Never used  Substance and Sexual Activity   Alcohol use: Not Currently    Comment: rare- beer   Drug use: Not Currently    Types: Cocaine    Comment: hx of cocaine use, states that he has been clean five years   Sexual activity: Not Currently  Other Topics Concern   Not on file  Social History Narrative   Not on file   Social Determinants of Health   Financial Resource Strain: Not on file  Food Insecurity: Not on file  Transportation Needs: Not on file  Physical Activity: Not on file  Stress: Not on file  Social Connections: Not on file  Intimate Partner Violence: Not on file     Allergies  Allergen  Reactions   Tramadol     Seizure     Outpatient Medications Prior to Visit  Medication Sig Dispense Refill   colchicine 0.6 MG tablet Take 1 tablet (0.6 mg total) by mouth 2 (two) times daily as needed. 20 tablet 0   glucose blood (TRUE METRIX BLOOD GLUCOSE TEST) test strip Use as instructed 100 each 12   melatonin 5 MG TABS Take 2 tablets (10 mg total) by mouth at bedtime. 60 tablet 0   naloxone (NARCAN) nasal spray 4 mg/0.1 mL Partner to Use as directed for signs of opioid overdose 2 each 0   oxyCODONE-acetaminophen (PERCOCET) 10-325 MG tablet Take 1 tablet by mouth every 6 (six) hours as needed for up to 10 days for pain. 40 tablet 0   TRUEplus Lancets 28G MISC USE TO MEASURE BLOOD SUGAR TWICE A DAY 100 each 1   atorvastatin (LIPITOR) 20 MG tablet TAKE 1 TABLET (20 MG TOTAL) BY MOUTH DAILY. 90 tablet 3   gabapentin (NEURONTIN) 300 MG capsule TAKE 2 CAPSULES (600 MG TOTAL) BY MOUTH 3 (THREE) TIMES DAILY. 180 capsule 0   metFORMIN (GLUCOPHAGE) 500 MG tablet TAKE 2 TABLETS (1,000 MG TOTAL) BY MOUTH 2 (TWO) TIMES DAILY WITH A MEAL. 180 tablet 3   methocarbamol (ROBAXIN) 500 MG tablet Take 1 tablet (500 mg total) by mouth every 6 (six) hours as needed for muscle spasms. 90 tablet 1   predniSONE (STERAPRED UNI-PAK 21 TAB) 10 MG (21) TBPK tablet take as directed on package 21 tablet 0   valsartan-hydrochlorothiazide (DIOVAN-HCT) 160-12.5 MG tablet TAKE 1 TABLET BY MOUTH DAILY. 90 tablet 2   Vitamin D, Ergocalciferol, (DRISDOL) 1.25 MG (50000 UNIT) CAPS capsule TAKE 1 CAPSULE (50,000 UNITS TOTAL) BY MOUTH EVERY 7 (SEVEN) DAYS. 12 capsule 5   Blood Glucose Monitoring Suppl (TRUE METRIX METER) w/Device KIT Use to measure blood sugar twice a day 1 kit 0   Dulaglutide 0.75 MG/0.5ML  SOPN INJECT 0.75 MG INTO THE SKIN ONCE A WEEK. (Patient not taking: Reported on 06/25/2021) 2 mL 4   No facility-administered medications prior to visit.      Review of Systems  HENT: Negative.    Respiratory:  Negative.    Cardiovascular: Negative.   Gastrointestinal: Negative.   Genitourinary: Negative.   Musculoskeletal:  Positive for gait problem and joint swelling.      Objective:   Physical Exam    Vitals:   06/25/21 1402  BP: (!) 143/90  Pulse: 94  Temp: 98.9 F (37.2 C)  TempSrc: Oral  SpO2: 96%  Weight: 196 lb (88.9 kg)    Gen: Pleasant, well-nourished, in no distress,  normal affect  ENT: No lesions,  mouth clear,  oropharynx clear, no postnasal drip  Neck: No JVD, no TMG, no carotid bruits  Lungs: No use of accessory muscles, no dullness to percussion, clear without rales or rhonchi  Cardiovascular: RRR, heart sounds normal, no murmur or gallops, no peripheral edema  Abdomen: soft and NT, no HSM,  BS normal  Musculoskeletal: No deformities, no cyanosis or clubbing, significant decreased range of motion left hip  Neuro: alert, non focal  Skin: Warm, no lesions or rashes     Assessment & Plan:  I personally reviewed all images and lab data in the [facility] system as well as any outside material available during this office visit and agree with the  radiology impressions.   Essential hypertension Hypertension not well controlled patient's been using cocaine recently we will continue valsartan HCT  Uncontrolled type 2 diabetes mellitus with hyperglycemia (HCC) Will check hemoglobin T0V metabolic panel lipid panel  Will renew Trulicity and metformin  Neuropathy due to type 2 diabetes mellitus (HCC) Plan refill of gabapentin  Avascular necrosis of bone of left hip ([place]) Brief conversation with the patient's orthopedic surgeon was had and until he quits cocaine completely he cannot be considered for planned surgery he also would benefit in getting the orange card  Substance abuse ([place]) Polysubstance use including cocaine heroin in the past he is now on Percocet through the chronic pain management program  Patient does have Narcan at home  We will check drug  screen today  We will connect patient with our clinical social worker for cocaine substance use program support  Will also connect patient with case manager for help with orange card applications  Unfortunately this patient will not be able undergo left hip total hip replacement until he is sober and off cocaine   Diagnoses and all orders for this visit:  Essential hypertension -     CBC with Differential/Platelet -     Comprehensive metabolic panel  Uncontrolled type 2 diabetes mellitus with hyperglycemia ([place]) -     Lipid panel -     Hemoglobin A1c -     Comprehensive metabolic panel -     Uric Acid -     Prealbumin  Avascular necrosis of bone of left hip (HCC) -     697948 11+Oxyco+Alc+Crt-Bund -     Uric Acid -     Prealbumin  Chronic pain of right knee  Chronic pain syndrome -     [id number] 11+Oxyco+Alc+Crt-Bund  Neuropathy due to type 2 diabetes mellitus ([place])  Substance abuse ([place])  Other orders -     atorvastatin (LIPITOR) 20 MG tablet; TAKE 1 TABLET (20 MG TOTAL) BY MOUTH DAILY. -     Dulaglutide 0.75 MG/0.5ML SOPN; INJECT 0.75 MG INTO THE SKIN  ONCE A WEEK. -     gabapentin (NEURONTIN) 300 MG capsule; TAKE 2 CAPSULES (600 MG TOTAL) BY MOUTH 3 (THREE) TIMES DAILY. -     metFORMIN (GLUCOPHAGE) 500 MG tablet; TAKE 2 TABLETS (1,000 MG TOTAL) BY MOUTH 2 (TWO) TIMES DAILY WITH A MEAL. -     valsartan-hydrochlorothiazide (DIOVAN-HCT) 160-12.5 MG tablet; TAKE 1 TABLET BY MOUTH DAILY. -     Vitamin D, Ergocalciferol, (DRISDOL) 1.25 MG (50000 UNIT) CAPS capsule; TAKE 1 CAPSULE (50,000 UNITS TOTAL) BY MOUTH EVERY 7 (SEVEN) DAYS.

## 2021-06-25 NOTE — Telephone Encounter (Signed)
   Chad Bautista DOB: 28-Feb-1976 MRN: [id number]   RIDER WAIVER AND RELEASE OF LIABILITY  For purposes of improving physical access to our facilities, [facility] is pleased to partner with third parties to provide [facility] patients or other authorized individuals the option of convenient, on-demand ground transportation services (the [employer]") through use of the technology service that enables users to request on-demand ground transportation from independent third-party providers.  By opting to use and accept these [employer], I, the undersigned, hereby agree on behalf of myself, and on behalf of any minor child using the Science writer for whom I am the parent or legal guardian, as follows:  Science writer provided to me are provided by independent third-party transportation providers who are not [employer] or employees and who are unaffiliated with [employer]. [facility] is neither a transportation carrier nor a common or public carrier. [facility] has no control over the quality or safety of the transportation that occurs as a result of the [employer]. [facility] cannot guarantee that any third-party transportation provider will complete any arranged transportation service. [facility] makes no representation, warranty, or guarantee regarding the reliability, timeliness, quality, safety, suitability, or availability of any of the Transport Services or that they will be error free. I fully understand that traveling by vehicle involves risks and dangers of serious bodily injury, including permanent disability, paralysis, and death. I agree, on behalf of myself and on behalf of any minor child using the Transport Services for whom I am the parent or legal guardian, that the entire risk arising out of my use of the [employer] remains solely with me, to the maximum extent permitted under applicable law. The [employer] are provided "as  is" and "as available." [facility] disclaims all representations and warranties, express, implied or statutory, not expressly set out in these terms, including the implied warranties of merchantability and fitness for a particular purpose. I hereby waive and release [facility], its agents, employees, officers, directors, representatives, insurers, attorneys, assigns, successors, subsidiaries, and affiliates from any and all past, present, or future claims, demands, liabilities, actions, causes of action, or suits of any kind directly or indirectly arising from acceptance and use of the [employer]. I further waive and release [facility] and its affiliates from all present and future liability and responsibility for any injury or death to persons or damages to property caused by or related to the use of the [employer]. I have read this Waiver and Release of Liability, and I understand the terms used in it and their legal significance. This Waiver is freely and voluntarily given with the understanding that my right (as well as the right of any minor child for whom I am the parent or legal guardian using the [employer]) to legal recourse against  in connection with the [employer] is knowingly surrendered in return for use of these services.   I attest that I read the consent document to Chad Bautista, gave Mr. Markwood the opportunity to ask questions and answered the questions asked (if any). I affirm that Selassie Spatafore then provided consent for he's participation in this program.     Chad Bautista

## 2021-06-25 NOTE — Assessment & Plan Note (Signed)
Hypertension not well controlled patient's been using cocaine recently we will continue valsartan HCT

## 2021-06-25 NOTE — Assessment & Plan Note (Signed)
Polysubstance use including cocaine heroin in the past he is now on Percocet through the chronic pain management program  Patient does have Narcan at home  We will check drug screen today  We will connect patient with our clinical social worker for cocaine substance use program support  Will also connect patient with case manager for help with orange card applications  Unfortunately this patient will not be able undergo left hip total hip replacement until he is sober and off cocaine

## 2021-06-26 LAB — CBC WITH DIFFERENTIAL/PLATELET
Basophils Absolute: 0 10*3/uL (ref 0.0–0.2)
Basos: 1 %
EOS (ABSOLUTE): 0.2 10*3/uL (ref 0.0–0.4)
Eos: 3 %
Hematocrit: 38.1 % (ref 37.5–51.0)
Hemoglobin: 12 g/dL — ABNORMAL LOW (ref 13.0–17.7)
Immature Grans (Abs): 0 10*3/uL (ref 0.0–0.1)
Immature Granulocytes: 0 %
Lymphocytes Absolute: 1.7 10*3/uL (ref 0.7–3.1)
Lymphs: 30 %
MCH: 25.4 pg — ABNORMAL LOW (ref 26.6–33.0)
MCHC: 31.5 g/dL (ref 31.5–35.7)
MCV: 81 fL (ref 79–97)
Monocytes Absolute: 0.4 10*3/uL (ref 0.1–0.9)
Monocytes: 7 %
Neutrophils Absolute: 3.3 10*3/uL (ref 1.4–7.0)
Neutrophils: 59 %
Platelets: 328 10*3/uL (ref 150–450)
RBC: 4.72 x10E6/uL (ref 4.14–5.80)
RDW: 14.3 % (ref 11.6–15.4)
WBC: 5.6 10*3/uL (ref 3.4–10.8)

## 2021-06-26 LAB — COMPREHENSIVE METABOLIC PANEL
ALT: 18 IU/L (ref 0–44)
AST: 22 IU/L (ref 0–40)
Albumin/Globulin Ratio: 0.9 — ABNORMAL LOW (ref 1.2–2.2)
Albumin: 3.5 g/dL — ABNORMAL LOW (ref 4.0–5.0)
Alkaline Phosphatase: 121 IU/L (ref 44–121)
BUN/Creatinine Ratio: 13 (ref 9–20)
BUN: 11 mg/dL (ref 6–24)
Bilirubin Total: 0.2 mg/dL (ref 0.0–1.2)
CO2: 23 mmol/L (ref 20–29)
Calcium: 9.4 mg/dL (ref 8.7–10.2)
Chloride: 100 mmol/L (ref 96–106)
Creatinine, Ser: 0.88 mg/dL (ref 0.76–1.27)
Globulin, Total: 4.1 g/dL (ref 1.5–4.5)
Glucose: 119 mg/dL — ABNORMAL HIGH (ref 65–99)
Potassium: 4.2 mmol/L (ref 3.5–5.2)
Sodium: 138 mmol/L (ref 134–144)
Total Protein: 7.6 g/dL (ref 6.0–8.5)
eGFR: 108 mL/min/{1.73_m2} (ref >59)

## 2021-06-26 LAB — HEMOGLOBIN A1C
Est. average glucose Bld gHb Est-mCnc: 126 mg/dL
Hgb A1c MFr Bld: 6 % — ABNORMAL HIGH (ref 4.8–5.6)

## 2021-06-26 LAB — LIPID PANEL
Chol/HDL Ratio: 3.3 ratio (ref 0.0–5.0)
Cholesterol, Total: 133 mg/dL (ref 100–199)
HDL: 40 mg/dL (ref >39)
LDL Chol Calc (NIH): 69 mg/dL (ref 0–99)
Triglycerides: 133 mg/dL (ref 0–149)
VLDL Cholesterol Cal: 24 mg/dL (ref 5–40)

## 2021-06-26 LAB — URIC ACID: Uric Acid: 5.5 mg/dL (ref 3.8–8.4)

## 2021-06-26 LAB — PREALBUMIN: PREALBUMIN: 12 mg/dL (ref 12–34)

## 2021-06-27 NOTE — Telephone Encounter (Signed)
First attempt to reach pt, no answer, left vm. Will try again tomorrow.

## 2021-06-28 LAB — DRUG SCREEN 764883 11+OXYCO+ALC+CRT-BUND
Amphetamines, Urine: NEGATIVE ng/mL
BENZODIAZ UR QL: NEGATIVE ng/mL
Barbiturate: NEGATIVE ng/mL
Cannabinoid Quant, Ur: NEGATIVE ng/mL
Creatinine: 161.4 mg/dL (ref 20.0–300.0)
Ethanol: NEGATIVE %
Meperidine: NEGATIVE ng/mL
Methadone Screen, Urine: NEGATIVE ng/mL
OPIATE SCREEN URINE: NEGATIVE ng/mL
Oxycodone/Oxymorphone, Urine: NEGATIVE ng/mL
Phencyclidine: NEGATIVE ng/mL
Propoxyphene: NEGATIVE ng/mL
Tramadol: NEGATIVE ng/mL
pH, Urine: 6.6 (ref 4.5–8.9)

## 2021-06-28 LAB — COCAINE CONF, UR
Benzoylecgonine GC/MS Conf: >5000 ng/mL
Cocaine Metab Quant, Ur: POSITIVE — AB

## 2021-06-28 NOTE — Telephone Encounter (Signed)
Spoke with pt, and he mentioned that he would call back when he gets home. Provided phone number.

## 2021-06-28 NOTE — Progress Notes (Signed)
Pt states that someone has informed patient of lab results.

## 2021-06-28 NOTE — Progress Notes (Signed)
Let pt know drug screen only positive for cocaine. Per our conversation he must stop cocaine use in order to stay on pain contract in our clinic and for dr Roda Shutters to replace his hip.  I sent a referral to [facility]

## 2021-06-28 NOTE — Telephone Encounter (Signed)
Contacted pt and scheduled appt for 07/01/21 at 3:30PM. Will discuss resources with him then

## 2021-07-01 ENCOUNTER — Other Ambulatory Visit: Payer: Self-pay | Admitting: Critical Care Medicine

## 2021-07-01 ENCOUNTER — Encounter: Payer: Self-pay | Admitting: Critical Care Medicine

## 2021-07-01 ENCOUNTER — Institutional Professional Consult (permissible substitution): Payer: Medicaid Other | Admitting: Clinical

## 2021-07-01 MED ORDER — OXYCODONE-ACETAMINOPHEN 10-325 MG PO TABS: 1.0000 | ORAL_TABLET | Freq: Four times a day (QID) | ORAL | 0 refills | Status: AC | PRN

## 2021-07-01 NOTE — Progress Notes (Signed)
PmDP reviewed  refill appropriate

## 2021-07-09 ENCOUNTER — Other Ambulatory Visit: Payer: Self-pay

## 2021-07-09 ENCOUNTER — Telehealth: Payer: Self-pay

## 2021-07-09 NOTE — Telephone Encounter (Signed)
PA FOR PERCOCET APPROVED UNTIL 01/05/2022

## 2021-07-17 ENCOUNTER — Inpatient Hospital Stay (HOSPITAL_COMMUNITY)
Admission: EM | Admit: 2021-07-17 | Discharge: 2021-07-19 | DRG: 683 | Disposition: A | Discharge status: Home / Self Care | Payer: Medicaid Other | Attending: Family Medicine | Admitting: Family Medicine

## 2021-07-17 ENCOUNTER — Other Ambulatory Visit: Payer: Self-pay

## 2021-07-17 ENCOUNTER — Encounter (HOSPITAL_COMMUNITY): Payer: Self-pay | Admitting: Family Medicine

## 2021-07-17 ENCOUNTER — Emergency Department (HOSPITAL_COMMUNITY): Payer: Medicaid Other

## 2021-07-17 DIAGNOSIS — F1910 Other psychoactive substance abuse, uncomplicated: Secondary | ICD-10-CM

## 2021-07-17 DIAGNOSIS — N179 Acute kidney failure, unspecified: Secondary | ICD-10-CM | POA: Diagnosis present

## 2021-07-17 DIAGNOSIS — M549 Dorsalgia, unspecified: Secondary | ICD-10-CM

## 2021-07-17 DIAGNOSIS — F17210 Nicotine dependence, cigarettes, uncomplicated: Secondary | ICD-10-CM | POA: Diagnosis present

## 2021-07-17 DIAGNOSIS — S060XAA Concussion with loss of consciousness status unknown, initial encounter: Secondary | ICD-10-CM

## 2021-07-17 DIAGNOSIS — S50819A Abrasion of unspecified forearm, initial encounter: Secondary | ICD-10-CM

## 2021-07-17 DIAGNOSIS — S0990XA Unspecified injury of head, initial encounter: Secondary | ICD-10-CM

## 2021-07-17 DIAGNOSIS — M109 Gout, unspecified: Secondary | ICD-10-CM | POA: Diagnosis present

## 2021-07-17 DIAGNOSIS — E785 Hyperlipidemia, unspecified: Secondary | ICD-10-CM | POA: Diagnosis present

## 2021-07-17 DIAGNOSIS — M87052 Idiopathic aseptic necrosis of left femur: Secondary | ICD-10-CM

## 2021-07-17 DIAGNOSIS — Y92009 Unspecified place in unspecified non-institutional (private) residence as the place of occurrence of the external cause: Secondary | ICD-10-CM

## 2021-07-17 DIAGNOSIS — S72002A Fracture of unspecified part of neck of left femur, initial encounter for closed fracture: Secondary | ICD-10-CM

## 2021-07-17 DIAGNOSIS — Z20822 Contact with and (suspected) exposure to covid-19: Secondary | ICD-10-CM | POA: Diagnosis present

## 2021-07-17 DIAGNOSIS — Z885 Allergy status to narcotic agent status: Secondary | ICD-10-CM

## 2021-07-17 DIAGNOSIS — F1490 Cocaine use, unspecified, uncomplicated: Secondary | ICD-10-CM | POA: Diagnosis present

## 2021-07-17 DIAGNOSIS — E119 Type 2 diabetes mellitus without complications: Secondary | ICD-10-CM

## 2021-07-17 DIAGNOSIS — S40811A Abrasion of right upper arm, initial encounter: Secondary | ICD-10-CM | POA: Diagnosis present

## 2021-07-17 DIAGNOSIS — Z7984 Long term (current) use of oral hypoglycemic drugs: Secondary | ICD-10-CM

## 2021-07-17 DIAGNOSIS — G8929 Other chronic pain: Secondary | ICD-10-CM | POA: Diagnosis present

## 2021-07-17 DIAGNOSIS — Z79899 Other long term (current) drug therapy: Secondary | ICD-10-CM

## 2021-07-17 DIAGNOSIS — Z96641 Presence of right artificial hip joint: Secondary | ICD-10-CM | POA: Diagnosis present

## 2021-07-17 DIAGNOSIS — S0081XA Abrasion of other part of head, initial encounter: Secondary | ICD-10-CM | POA: Diagnosis present

## 2021-07-17 DIAGNOSIS — W03XXXA Other fall on same level due to collision with another person, initial encounter: Secondary | ICD-10-CM | POA: Diagnosis present

## 2021-07-17 DIAGNOSIS — I10 Essential (primary) hypertension: Secondary | ICD-10-CM | POA: Diagnosis present

## 2021-07-17 DIAGNOSIS — S060X0A Concussion without loss of consciousness, initial encounter: Secondary | ICD-10-CM

## 2021-07-17 DIAGNOSIS — Z8249 Family history of ischemic heart disease and other diseases of the circulatory system: Secondary | ICD-10-CM

## 2021-07-17 DIAGNOSIS — S40812A Abrasion of left upper arm, initial encounter: Secondary | ICD-10-CM | POA: Diagnosis present

## 2021-07-17 DIAGNOSIS — S060X9A Concussion with loss of consciousness of unspecified duration, initial encounter: Secondary | ICD-10-CM | POA: Diagnosis present

## 2021-07-17 LAB — PROTIME-INR
INR: 1.1 (ref 0.8–1.2)
Prothrombin Time: 13.9 seconds (ref 11.4–15.2)

## 2021-07-17 LAB — CBC
HCT: 39.6 % (ref 39.0–52.0)
Hemoglobin: 12.5 g/dL — ABNORMAL LOW (ref 13.0–17.0)
MCH: 26.2 pg (ref 26.0–34.0)
MCHC: 31.6 g/dL (ref 30.0–36.0)
MCV: 83 fL (ref 80.0–100.0)
Platelets: 307 10*3/uL (ref 150–400)
RBC: 4.77 MIL/uL (ref 4.22–5.81)
RDW: 15.1 % (ref 11.5–15.5)
WBC: 12.2 10*3/uL — ABNORMAL HIGH (ref 4.0–10.5)
nRBC: 0 % (ref 0.0–0.2)

## 2021-07-17 LAB — URINALYSIS, ROUTINE W REFLEX MICROSCOPIC
Bilirubin Urine: NEGATIVE
Glucose, UA: NEGATIVE mg/dL
Hgb urine dipstick: NEGATIVE
Ketones, ur: NEGATIVE mg/dL
Leukocytes,Ua: NEGATIVE
Nitrite: NEGATIVE
Protein, ur: 100 mg/dL — AB
Specific Gravity, Urine: 1.02 (ref 1.005–1.030)
pH: 5 (ref 5.0–8.0)

## 2021-07-17 LAB — COMPREHENSIVE METABOLIC PANEL
ALT: 14 U/L (ref 0–44)
AST: 20 U/L (ref 15–41)
Albumin: 3.8 g/dL (ref 3.5–5.0)
Alkaline Phosphatase: 77 U/L (ref 38–126)
Anion gap: 13 (ref 5–15)
BUN: 16 mg/dL (ref 6–20)
CO2: 20 mmol/L — ABNORMAL LOW (ref 22–32)
Calcium: 9.3 mg/dL (ref 8.9–10.3)
Chloride: 104 mmol/L (ref 98–111)
Creatinine, Ser: 1.77 mg/dL — ABNORMAL HIGH (ref 0.61–1.24)
GFR, Estimated: 48 mL/min — ABNORMAL LOW (ref >60)
Glucose, Bld: 118 mg/dL — ABNORMAL HIGH (ref 70–99)
Potassium: 3.4 mmol/L — ABNORMAL LOW (ref 3.5–5.1)
Sodium: 137 mmol/L (ref 135–145)
Total Bilirubin: 0.4 mg/dL (ref 0.3–1.2)
Total Protein: 7.5 g/dL (ref 6.5–8.1)

## 2021-07-17 LAB — I-STAT CHEM 8, ED
BUN: 17 mg/dL (ref 6–20)
Calcium, Ion: 1.19 mmol/L (ref 1.15–1.40)
Chloride: 105 mmol/L (ref 98–111)
Creatinine, Ser: 1.7 mg/dL — ABNORMAL HIGH (ref 0.61–1.24)
Glucose, Bld: 117 mg/dL — ABNORMAL HIGH (ref 70–99)
HCT: 40 % (ref 39.0–52.0)
Hemoglobin: 13.6 g/dL (ref 13.0–17.0)
Potassium: 3.4 mmol/L — ABNORMAL LOW (ref 3.5–5.1)
Sodium: 140 mmol/L (ref 135–145)
TCO2: 23 mmol/L (ref 22–32)

## 2021-07-17 LAB — RAPID URINE DRUG SCREEN, HOSP PERFORMED
Amphetamines: NOT DETECTED
Barbiturates: NOT DETECTED
Benzodiazepines: NOT DETECTED
Cocaine: POSITIVE — AB
Opiates: NOT DETECTED
Tetrahydrocannabinol: NOT DETECTED

## 2021-07-17 LAB — ETHANOL: Alcohol, Ethyl (B): <10 mg/dL (ref <10)

## 2021-07-17 LAB — SARS CORONAVIRUS 2 (TAT 6-24 HRS): SARS Coronavirus 2: NEGATIVE

## 2021-07-17 LAB — LACTIC ACID, PLASMA
Lactic Acid, Venous: 2.7 mmol/L (ref 0.5–1.9)
Lactic Acid, Venous: 1.9 mmol/L (ref 0.5–1.9)

## 2021-07-17 LAB — SAMPLE TO BLOOD BANK

## 2021-07-17 LAB — HIV ANTIBODY (ROUTINE TESTING W REFLEX): HIV Screen 4th Generation wRfx: NONREACTIVE

## 2021-07-17 MED ORDER — LACTATED RINGERS IV BOLUS
1000.0000 mL | Freq: Once | INTRAVENOUS | Status: AC
  Administered 2021-07-17: 1000 mL via INTRAVENOUS

## 2021-07-17 MED ORDER — SODIUM CHLORIDE 0.9 % IV BOLUS: 1000.0000 mL | Freq: Once | INTRAVENOUS | Status: DC

## 2021-07-17 MED ORDER — ONDANSETRON HCL 4 MG/2ML IJ SOLN: 4.0000 mg | Freq: Four times a day (QID) | INTRAMUSCULAR | Status: DC | PRN

## 2021-07-17 MED ORDER — OXYCODONE HCL 5 MG PO TABS
5.0000 mg | ORAL_TABLET | Freq: Four times a day (QID) | ORAL | Status: DC | PRN
  Administered 2021-07-17 – 2021-07-18 (×4): 5 mg via ORAL
  Filled 2021-07-17 (×4): qty 1

## 2021-07-17 MED ORDER — NICOTINE 14 MG/24HR TD PT24
14.0000 mg | MEDICATED_PATCH | Freq: Every day | TRANSDERMAL | Status: DC
  Administered 2021-07-18: 14 mg via TRANSDERMAL
  Filled 2021-07-17 (×2): qty 1

## 2021-07-17 MED ORDER — ACETAMINOPHEN 325 MG PO TABS: 650.0000 mg | ORAL_TABLET | Freq: Four times a day (QID) | ORAL | Status: DC | PRN

## 2021-07-17 MED ORDER — POTASSIUM CHLORIDE CRYS ER 20 MEQ PO TBCR
40.0000 meq | EXTENDED_RELEASE_TABLET | Freq: Once | ORAL | Status: AC
  Administered 2021-07-17: 40 meq via ORAL
  Filled 2021-07-17: qty 2

## 2021-07-17 MED ORDER — ACETAMINOPHEN 650 MG RE SUPP: 650.0000 mg | Freq: Four times a day (QID) | RECTAL | Status: DC | PRN

## 2021-07-17 MED ORDER — ONDANSETRON HCL 4 MG PO TABS: 4.0000 mg | ORAL_TABLET | Freq: Four times a day (QID) | ORAL | Status: DC | PRN

## 2021-07-17 MED ORDER — LACTATED RINGERS IV SOLN: INTRAVENOUS | Status: DC

## 2021-07-17 NOTE — ED Notes (Signed)
Attempted to give reportx1 
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
## 2021-07-17 NOTE — ED Notes (Signed)
Pt unable to give urine sample at this time, advised for need & voices understanding

## 2021-07-17 NOTE — Progress Notes (Signed)
Patient received to the unit. Patient is alert and oriented x4. Iv In place and running fluid. Skin assessment done with another nurse. Given instructions about call bell and phone.Bed in low position and call bell in reach.

## 2021-07-18 DIAGNOSIS — F1910 Other psychoactive substance abuse, uncomplicated: Secondary | ICD-10-CM

## 2021-07-18 LAB — BASIC METABOLIC PANEL
Anion gap: 10 (ref 5–15)
BUN: 13 mg/dL (ref 6–20)
CO2: 24 mmol/L (ref 22–32)
Calcium: 8.8 mg/dL — ABNORMAL LOW (ref 8.9–10.3)
Chloride: 98 mmol/L (ref 98–111)
Creatinine, Ser: 0.95 mg/dL (ref 0.61–1.24)
GFR, Estimated: >60 mL/min (ref >60)
Glucose, Bld: 90 mg/dL (ref 70–99)
Potassium: 3.7 mmol/L (ref 3.5–5.1)
Sodium: 132 mmol/L — ABNORMAL LOW (ref 135–145)

## 2021-07-18 MED ORDER — GABAPENTIN 300 MG PO CAPS
600.0000 mg | ORAL_CAPSULE | Freq: Three times a day (TID) | ORAL | Status: DC
  Administered 2021-07-18 – 2021-07-19 (×3): 600 mg via ORAL
  Filled 2021-07-18 (×3): qty 2

## 2021-07-18 MED ORDER — HEPARIN SODIUM (PORCINE) 5000 UNIT/ML IJ SOLN: 5000.0000 [IU] | Freq: Three times a day (TID) | INTRAMUSCULAR | Status: DC

## 2021-07-18 MED ORDER — ATORVASTATIN CALCIUM 10 MG PO TABS
20.0000 mg | ORAL_TABLET | Freq: Every day | ORAL | Status: DC
  Administered 2021-07-18 – 2021-07-19 (×2): 20 mg via ORAL
  Filled 2021-07-18 (×2): qty 2

## 2021-07-18 MED ORDER — OXYCODONE HCL 5 MG PO TABS
10.0000 mg | ORAL_TABLET | Freq: Four times a day (QID) | ORAL | Status: DC | PRN
  Administered 2021-07-18 – 2021-07-19 (×4): 10 mg via ORAL
  Filled 2021-07-18 (×4): qty 2
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
## 2021-07-18 NOTE — Progress Notes (Addendum)
PT Cancellation Note  Patient Details Name: Chad Bautista MRN: [id number] DOB: Dec 31, 1975   Cancelled Treatment:    Reason Eval/Treat Not Completed: Medical issues which prohibited therapy Imaging showing L femoral head fx, and awaiting ortho consult. Will hold until pt cleared by MD and follow up as schedule allows.   1422: still awaiting ortho consult. Will follow up as pt medically cleared and as schedule allows.   Cindee Salt, DPT  Acute Rehabilitation Services  Pager: [phone] Office: [phone]    Lehman Prom 07/18/2021, 8:38 AM

## 2021-07-19 ENCOUNTER — Ambulatory Visit: Payer: Self-pay | Admitting: *Deleted

## 2021-07-19 ENCOUNTER — Telehealth: Payer: Self-pay | Admitting: Critical Care Medicine

## 2021-07-19 DIAGNOSIS — M87052 Idiopathic aseptic necrosis of left femur: Secondary | ICD-10-CM

## 2021-07-19 DIAGNOSIS — S72002A Fracture of unspecified part of neck of left femur, initial encounter for closed fracture: Secondary | ICD-10-CM

## 2021-07-19 MED ORDER — OXYCODONE-ACETAMINOPHEN 10-325 MG PO TABS: 1.0000 | ORAL_TABLET | Freq: Three times a day (TID) | ORAL | 0 refills | Status: DC | PRN

## 2021-07-19 NOTE — Telephone Encounter (Signed)
This is not something I can do
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
## 2021-07-19 NOTE — Progress Notes (Signed)
PT Cancellation Note  Patient Details Name: Chad Bautista MRN: [id number] DOB: 07/20/1976   Cancelled Treatment:    Reason Eval/Treat Not Completed: Other (comment).  Refusing PT intervention despite discussion about PT assisting with NWB gait.  Has stairs to enter his house.  Asking for his personal MD to be contacted and wants to be transported to another facility.  Assured pt PT would relay his information to the staff for consideration.   Ivar Drape 07/19/2021, 9:50 AM  Samul Dada, PT MS Acute Rehab Dept. Number: [facility] [id number] and [facility] [phone]

## 2021-07-19 NOTE — Telephone Encounter (Signed)
Attempted to call pt again.   The voicemail says it's a voicemail for [employer].  (This occurred the first time I attempted and left a message).   No message left this time when no one answered since information left with previous call.  Per his chart he is still admitted to the hospital.
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
## 2021-07-19 NOTE — Telephone Encounter (Signed)
Routing to PCP for review.

## 2021-07-19 NOTE — Telephone Encounter (Signed)
See telephone encounter on 07/19/21.

## 2021-07-19 NOTE — Progress Notes (Signed)
Orthopedic Tech Progress Note Patient Details:  Vir Whetstine November 07, 1976 [id number]  Ortho Devices Type of Ortho Device: Crutches Ortho Device/Splint Interventions: Adjustment, Ordered      Maelin Kurkowski A Charleene Callegari 07/19/2021, 5:28 PM
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
## 2021-07-19 NOTE — Progress Notes (Signed)
PT Cancellation Note  Patient Details Name: Chad Bautista MRN: [id number] DOB: 11/02/1976   Cancelled Treatment:    Reason Eval/Treat Not Completed: Other (comment).  Pt is in his room walking with no AD, WB on fracture.  Talked with him about using crutches with PT.  Pt began to yell, stated he was trying to get to BR and did not need PT to help him.  Refusing to use AD with any gait, and refusing gait training with PT.  Reported to nursing, and will revisit this if pt becomes willing to work with PT.     Ivar Drape 07/19/2021, 3:35 PM  Samul Dada, PT MS Acute Rehab Dept. Number: [facility] [id number] and [facility] [phone]

## 2021-07-22 ENCOUNTER — Telehealth: Payer: Self-pay

## 2021-07-22 NOTE — Telephone Encounter (Signed)
Transition Care Management Unsuccessful Follow-up Telephone Call  Date of discharge and from where:  [facility] on 07/19/2021  Attempts:  1st Attempt  Reason for unsuccessful TCM follow-up call:  Left voice message unable to reach pt  at    [phone].  Pt has scheduled on 07/29/2021 with Dr Delford Field at [facility]

## 2021-07-23 ENCOUNTER — Telehealth: Payer: Self-pay

## 2021-07-23 NOTE — Telephone Encounter (Signed)
Transition Care Management Unsuccessful Follow-up Telephone Call  Date of discharge and from where:  07/19/2021, [facility]   Attempts:  2nd Attempt  Reason for unsuccessful TCM follow-up call:  Left voice message on # [phone].  Patient has appointment with Dr Delford Field 07/29/2021 @ [facility]

## 2021-07-24 ENCOUNTER — Telehealth: Payer: Self-pay | Admitting: Critical Care Medicine

## 2021-07-24 ENCOUNTER — Telehealth: Payer: Self-pay

## 2021-07-24 NOTE — Telephone Encounter (Signed)
Copied from CRM [id number]. Topic: General - Other >> Jul 19, 2021  9:04 AM Traci Sermon wrote: Reason for CRM: Pt called in stating he is currently in the hospital and needing a hip surgery, and [facility] is trying to send him home with a broken hip. Pt asked if PCP could reach out to him ASAP. Pt states he is in room 5C20. Please advise.

## 2021-07-24 NOTE — Telephone Encounter (Signed)
Transition Care Management Unsuccessful Follow-up Telephone Call  Date of discharge and from where:  07/19/2021, [facility]   Attempts:  3rd Attempt  Reason for unsuccessful TCM follow-up call:  Left voice message on # [phone].   Patient has appointment with Dr Delford Field 07/29/2021 @ [facility]

## 2021-07-29 ENCOUNTER — Ambulatory Visit: Payer: Medicaid Other | Admitting: Critical Care Medicine

## 2021-08-01 DIAGNOSIS — K759 Inflammatory liver disease, unspecified: Secondary | ICD-10-CM

## 2021-08-01 HISTORY — DX: Inflammatory liver disease, unspecified: K75.9

## 2021-08-05 ENCOUNTER — Telehealth: Payer: Self-pay | Admitting: Critical Care Medicine

## 2021-08-05 DIAGNOSIS — F1910 Other psychoactive substance abuse, uncomplicated: Secondary | ICD-10-CM

## 2021-08-05 DIAGNOSIS — G894 Chronic pain syndrome: Secondary | ICD-10-CM

## 2021-08-05 DIAGNOSIS — M87052 Idiopathic aseptic necrosis of left femur: Secondary | ICD-10-CM

## 2021-08-05 NOTE — Telephone Encounter (Signed)
Pt stating he is clean from cocaine and wanting ortho eval on left hip  I will bring him in for drug screen today and refer back to Dr Roda Shutters with pending drug screen

## 2021-08-06 ENCOUNTER — Other Ambulatory Visit: Payer: Self-pay | Admitting: Critical Care Medicine

## 2021-08-06 MED ORDER — OXYCODONE-ACETAMINOPHEN 10-325 MG PO TABS: 1.0000 | ORAL_TABLET | Freq: Three times a day (TID) | ORAL | 0 refills | Status: AC | PRN

## 2021-08-06 NOTE — Progress Notes (Signed)
Coming for drug screen today.  Refilled pain meds  PDMP checked

## 2021-08-13 ENCOUNTER — Other Ambulatory Visit: Payer: Self-pay

## 2021-08-13 ENCOUNTER — Ambulatory Visit (INDEPENDENT_AMBULATORY_CARE_PROVIDER_SITE_OTHER): Payer: Self-pay | Admitting: Orthopaedic Surgery

## 2021-08-13 ENCOUNTER — Encounter: Payer: Self-pay | Admitting: Orthopaedic Surgery

## 2021-08-13 DIAGNOSIS — M87052 Idiopathic aseptic necrosis of left femur: Secondary | ICD-10-CM

## 2021-08-13 NOTE — Progress Notes (Signed)
Office Visit Note   Patient: Chad Bautista           Date of Birth: [birth year]           MRN: [id number] Visit Date: 08/13/2021              Requested by: Storm Frisk, MD [address] [place],  [place] [address] PCP: Storm Frisk, MD   Assessment & Plan: Visit Diagnoses:  1. Avascular necrosis of bone of left hip (HCC)     Plan: Impression is left hip DJD secondary to avascular necrosis.  I reviewed the x-rays with Chad Bautista today and it does show severe collapse of the femoral head and secondary DJD.  Based on these findings and the fact that he has been clean from cocaine I am willing to move forward with scheduling hip replacement as soon as possible per his request.  We will plan to obtain a UDS the morning of surgery.  We again reviewed the risks and recovery time of the surgery.  All questions answered.  Follow-Up Instructions: Return for Postop.   Orders:  No orders of the defined types were placed in this encounter.  No orders of the defined types were placed in this encounter.     Procedures: No procedures performed   Clinical Data: No additional findings.   Subjective: Chief Complaint  Patient presents with   Left Hip - Follow-up    Chad Bautista follows up today for chronic severe left hip pain due to advanced DJD secondary to avascular necrosis.  I performed right total hip replacement 2020 which he has done well from and is overall very pleased.  He has abstained from cocaine for a month.     Review of Systems  Constitutional: Negative.   All other systems reviewed and are negative.   Objective: Vital Signs: There were no vitals taken for this visit.  Physical Exam Vitals and nursing note reviewed.  Constitutional:      Appearance: He is well-developed.  Pulmonary:     Effort: Pulmonary effort is normal.  Abdominal:     Palpations: Abdomen is soft.  Skin:    General: Skin is warm.  Neurological:     Mental Status: He is  alert and oriented to person, place, and time.  Psychiatric:        Behavior: Behavior normal.        Thought Content: Thought content normal.        Judgment: Judgment normal.    Ortho Exam Left hip and lower extremity is shortened by about an inch.  He has very limited range of motion and has severe pain with any attempted range of motion.  No neurovascular compromise.  Specialty Comments:  No specialty comments available.  Imaging: No results found.   PMFS History: Patient Active Problem List   Diagnosis Date Noted   Avascular necrosis of femoral head, left (HCC)    Closed fracture of left hip (HCC)    AKI (acute kidney injury) (HCC) 07/17/2021   Polysubstance abuse (HCC) 07/17/2021   Concussion 07/17/2021   Diabetes mellitus type 2 in nonobese (HCC) 07/17/2021   Chronic pain syndrome 04/29/2021   Avascular necrosis of bone of left hip (HCC) 04/23/2021   Heroin use 04/23/2021   Hepatitis C infection 11/29/2020   Hyperlipidemia associated with type 2 diabetes mellitus (HCC) 11/27/2020   Chronic pain of right knee 05/24/2020   Methamphetamine use (HCC) 04/24/2020   Substance abuse (HCC) 04/24/2020  Uncontrolled type 2 diabetes mellitus with hyperglycemia (HCC) 04/09/2020   Neuropathy due to type 2 diabetes mellitus (HCC) 04/09/2020   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   Vitamin D deficiency 01/19/2020   Tobacco use disorder 01/18/2020   Alcohol use 01/18/2020   Essential hypertension 01/18/2020   Moderate episode of recurrent major depressive disorder (HCC) 01/18/2020   Past Medical History:  Diagnosis Date   Cocaine use 04/24/2020   Diabetes mellitus without complication (HCC)    Head injury with loss of consciousness (HCC)    Hypertension     Family History  Problem Relation Age of Onset   Heart disease Mother     Past Surgical History:  Procedure Laterality Date   ANKLE FRACTURE SURGERY     APPENDECTOMY     NASAL SEPTUM SURGERY      TONSILLECTOMY     TOTAL HIP ARTHROPLASTY Right 04/02/2020   Procedure: RIGHT TOTAL HIP ARTHROPLASTY ANTERIOR APPROACH;  Surgeon: Tarry Kos, MD;  Location: [facility] OR;  Service: Orthopedics;  Laterality: Right;   Social History   Occupational History   Not on file  Tobacco Use   Smoking status: Every Day    Packs/day: 0.50    Years: 30.00    Pack years: 15.00    Types: Cigarettes   Smokeless tobacco: Never  Vaping Use   Vaping Use: Never used  Substance and Sexual Activity   Alcohol use: Not Currently    Comment: rare- beer   Drug use: Not Currently    Types: Cocaine    Comment: hx of cocaine use, states that he has been clean five years   Sexual activity: Not Currently

## 2021-08-27 ENCOUNTER — Telehealth: Payer: Self-pay

## 2021-08-27 NOTE — Telephone Encounter (Signed)
Just wanted to keep you in the loop Dr. Delford Field.

## 2021-08-27 NOTE — Telephone Encounter (Signed)
Chad Bautista from [facility] wanted to call and make Dr. Roda Shutters aware that she has been unable to contact Chad Bautista to scheduled pre op lab work and Covid testing. She states that she has attempted to contact Chad Bautista multiple times and is unable to reach Chad Bautista and she is not able to leave a voicemail. Chad Bautista is scheduled to have L THA 09/02/2021

## 2021-08-27 NOTE — Telephone Encounter (Signed)
Ok, thanks.

## 2021-08-28 NOTE — Telephone Encounter (Signed)
Thanks

## 2021-08-29 ENCOUNTER — Other Ambulatory Visit: Payer: Self-pay | Admitting: Physician Assistant

## 2021-08-29 ENCOUNTER — Other Ambulatory Visit: Payer: Self-pay

## 2021-08-29 MED ORDER — DOCUSATE SODIUM 100 MG PO CAPS
100.0000 mg | ORAL_CAPSULE | Freq: Every day | ORAL | 2 refills | Status: AC | PRN
  Filled 2021-08-29: qty 30, 30d supply, fill #0

## 2021-08-29 MED ORDER — ONDANSETRON HCL 4 MG PO TABS
4.0000 mg | ORAL_TABLET | Freq: Three times a day (TID) | ORAL | 0 refills | Status: AC | PRN
  Filled 2021-08-29: qty 40, 14d supply, fill #0

## 2021-08-29 MED ORDER — OXYCODONE-ACETAMINOPHEN 5-325 MG PO TABS: 1.0000 | ORAL_TABLET | Freq: Four times a day (QID) | ORAL | 0 refills | Status: AC | PRN

## 2021-08-29 MED ORDER — SULFAMETHOXAZOLE-TRIMETHOPRIM 800-160 MG PO TABS
1.0000 | ORAL_TABLET | Freq: Two times a day (BID) | ORAL | 0 refills | Status: AC
  Filled 2021-08-29: qty 20, 10d supply, fill #0

## 2021-08-29 MED ORDER — METHOCARBAMOL 500 MG PO TABS
500.0000 mg | ORAL_TABLET | Freq: Two times a day (BID) | ORAL | 2 refills | Status: AC | PRN
  Filled 2021-08-29: qty 30, 15d supply, fill #0

## 2021-08-29 MED ORDER — ASPIRIN EC 81 MG PO TBEC
81.0000 mg | DELAYED_RELEASE_TABLET | Freq: Two times a day (BID) | ORAL | 0 refills | Status: AC
  Filled 2021-08-29: qty 84, 42d supply, fill #0

## 2021-08-29 NOTE — Telephone Encounter (Signed)
Tried to call pt on several old #s I had , I sent a Mychart msg to the pt, he has responded to those in the past ,  I know where this pt lives, I may go by the home to inquire

## 2021-08-30 ENCOUNTER — Encounter (HOSPITAL_COMMUNITY): Payer: Self-pay | Admitting: Vascular Surgery

## 2021-08-30 ENCOUNTER — Encounter (HOSPITAL_COMMUNITY): Payer: Self-pay | Admitting: Orthopaedic Surgery

## 2021-08-30 MED ORDER — TRANEXAMIC ACID 1000 MG/10ML IV SOLN
2000.0000 mg | INTRAVENOUS | Status: AC
  Filled 2021-08-30: qty 20

## 2021-08-30 NOTE — Anesthesia Preprocedure Evaluation (Deleted)
Anesthesia Evaluation  Patient identified by MRN, date of birth, ID band Patient awake    Reviewed: Allergy & Precautions, H&P , NPO status , Patient's Chart, lab work & pertinent test results, reviewed documented beta blocker date and time   Airway Mallampati: II  TM Distance: >3 FB Neck ROM: full    Dental no notable dental hx.    Pulmonary neg pulmonary ROS, Current Smoker,    Pulmonary exam normal breath sounds clear to auscultation       Cardiovascular Exercise Tolerance: Good hypertension, negative cardio ROS   Rhythm:regular Rate:Normal     Neuro/Psych PSYCHIATRIC DISORDERS Depression negative neurological ROS     GI/Hepatic negative GI ROS, (+)     substance abuse  cocaine use and methamphetamine use, Hepatitis -, C  Endo/Other  negative endocrine ROSdiabetes, Type 2  Renal/GU negative Renal ROS  negative genitourinary   Musculoskeletal  (+) narcotic dependent  Abdominal   Peds  Hematology negative hematology ROS (+)   Anesthesia Other Findings   Reproductive/Obstetrics negative OB ROS                            Anesthesia Physical Anesthesia Plan  ASA: 3  Anesthesia Plan: MAC and Spinal   Post-op Pain Management:    Induction: Intravenous  PONV Risk Score and Plan: 1 and Treatment may vary due to age or medical condition  Airway Management Planned: Nasal Cannula and Natural Airway  Additional Equipment: None  Intra-op Plan:   Post-operative Plan:   Informed Consent: I have reviewed the patients History and Physical, chart, labs and discussed the procedure including the risks, benefits and alternatives for the proposed anesthesia with the patient or authorized representative who has indicated his/her understanding and acceptance.     Dental Advisory Given  Plan Discussed with: CRNA and Anesthesiologist  Anesthesia Plan Comments: (See PAT note written  08/30/2021 by Myra Gianotti, PA-C. For UDS on arrival.  Patient is a 45 year old male scheduled for the above procedure.  History includessmoking, HTN,DM2, HLD, hepatitis C, head injury/LOC (history of), right THA 04/02/20. Per documentation, last cocaine use July 2022. + Opiates & Methamphetamines 04/17/21. He was evaluated at [facility] 04/17/21-04/18/21 for left hip pain and had to be intubated for protection against himself and his hip in the setting or methamphetamine and opiate use. He had MRI under anesthesia 07/30/21 at [facility] for evaluation of left hip pain while visiting a friend.   He is currently a same day work-up because staff were not able to get him to return calls to schedule an appointment. Given his history, surgeon has ordered a preoperative UDS. His last EKG is > 63 year old. Labs and EKG as indicated on arrival. Anesthesia team to evaluate on the day of surgery.)       Anesthesia Quick Evaluation

## 2021-08-30 NOTE — Progress Notes (Signed)
Anesthesia Chart Review: SAME DAY WORK-UP   Case: [id number] Date/Time: 09/02/21 1427   Procedure: LEFT TOTAL HIP ARTHROPLASTY ANTERIOR APPROACH (Left: Hip) - C-3   Anesthesia type: Spinal   Pre-op diagnosis: left hip degenerative joint disease, avascular necrosis   Location: [facility] OR ROOM 04 / [facility]   Surgeons: Leandrew Koyanagi, MD       DISCUSSION:  Patient is a 45 year old male scheduled for the above procedure.   History includes smoking, HTN, DM2, HLD, hepatitis C, head injury/LOC (history of), right THA 04/02/20. Per documentation, last cocaine use July 2022. + Opiates & Methamphetamines 04/17/21. He was evaluated at [facility] 04/17/21-04/18/21 for left hip pain and had to be intubated for protection against himself and his hip in the setting or methamphetamine and opiate use. He had MRI under anesthesia 07/30/21 at [place] for evaluation of left hip pain while visiting a friend.   He is currently a same day work-up because staff were not able to get him to return calls to schedule an appointment. Given his history, surgeon has ordered a preoperative UDS. His last EKG is > 77 year old. Labs and EKG as indicated on arrival. Anesthesia team to evaluate on the day of surgery.      VS: Ht 6' 3"  (1.905 m)   Wt 102.1 kg   BMI 28.12 kg/m  BP Readings from Last 3 Encounters:  07/19/21 (!) 163/96  06/25/21 (!) 143/90  04/29/21 (!) 150/99   Pulse Readings from Last 3 Encounters:  07/19/21 72  06/25/21 94  04/29/21 (!) 106     PROVIDERS: Elsie Stain, MD is PCP ([place])   LABS: For day of surgery as indicated. As of 07/18/21,    IMAGES: MRI Left Hip 07/30/21 ([facility]): IMPRESSION:  1. Sequelae of long-standing avascular necrosis of the left hip with  involvement of the femoral head, neck, and intertrochanteric region.  Large volume left hip joint effusion with synovitis and with exuberant  reactive marrow signal changes of the acetabulum  and the proximal femur  raises the concern for septic arthritis.  2. End-stage accelerated osteoarthrosis of the left hip from combination  of long-standing avascular necrosis with femoral head fragmentation.  3. Significant feathery edema about the left gluteus medius minimus and  left adductor musculature is most likely reactive; however, a component  of superimposed infectious myositis is not excluded.  4. Partial-thickness undersurface tearing of the left gluteus minimus  tendon insertion with superimposed moderate tendinosis. Mild left  gluteus medius tendinosis.    CT Pelvis & Left Hip 07/27/21 ([facility]): IMPRESSION:  - Comminuted minimally displaced fracture of the superomedial left femoral  head. No priors are available for comparison to determine chronicity.  Given the history of acute onset severe left hip pain, this likely  represents an acute fracture in the setting of avascular necrosis.  - Redemonstrated severe degenerative changes within the left hip involving  the femoral head and acetabulum with bone-on-bone articulation,  subchondral cysts, and debris within the left hip joint.  - Ill-defined soft tissue infiltration surrounding the left hip joint,  likely sequelae of chronic inflammation given severe degenerative  changes of the left hip.  - Total right hip arthroplasty without evidence of complication. Small  corticated ossific fragment adjacent to the right lesser trochanter  (series 900, image 67), likely sequelae of remote trauma/enthesopathy.  - Circumferential wall thickening of the urinary bladder, which may be  secondary  to underdistention versus cystitis. Suggest correlation with  urinalysis if of clinical concern.  - Small fat-containing right inguinal hernia.    CT Chest/abd/pelvis & C/T/L Spines 04/18/21 ([place]): IMPRESSION:  1.  No evidence of trauma in the chest, abdomen, or pelvis as well as thoracic and lumbar spines.  2.  Known  avascular necrosis of the left femoral head with likely pathologic fracture.  3.  Hepatic steatosis.  4.  Small fat-containing right inguinal hernia.     EKG: For day of surgery as indicated. There is a Narrative from 07/28/21 EKG from [place]: Vent. Rate : 084 BPM     Atrial Rate : 084 BPM  P-R Int : 150 ms          QRS Dur : 084 ms  QT Int : 388 ms       P-R-T Axes : 065 057 031 degrees  QTc Int : 458 ms  Normal sinus rhythm  Normal ECG  Referred By:             Confirmed [id number] Farrehi MD   CV:  RLE Venous US 07/31/21 ([facility]): Narrative: The RIGHT lower extremity was imaged, assessed by Doppler, and appears  patent with no evidence of DVT within the imaged veins.     Past Medical History:  Diagnosis Date   Cocaine use 04/24/2020   Last use was in July 2022 per patient   Diabetes mellitus without complication ([place])    type 2   Head injury with loss of consciousness ([place])    Hepatitis 08/01/2021   Hep C   HLD (hyperlipidemia)    Hypertension    no current meds    Past Surgical History:  Procedure Laterality Date   ANKLE FRACTURE SURGERY     APPENDECTOMY     NASAL SEPTUM SURGERY     TONSILLECTOMY     TOTAL HIP ARTHROPLASTY Right 04/02/2020   Procedure: RIGHT TOTAL HIP ARTHROPLASTY ANTERIOR APPROACH;  Surgeon: Leandrew Koyanagi, MD;  Location: [place];  Service: Orthopedics;  Laterality: Right;    MEDICATIONS:  [START ON 09/02/2021] tranexamic acid (CYKLOKAPRON) 2,000 mg in sodium chloride 0.9 % 50 mL Topical Application    aspirin EC 81 MG tablet   atorvastatin (LIPITOR) 20 MG tablet   Blood Glucose Monitoring Suppl (TRUE METRIX METER) w/Device KIT   colchicine 0.6 MG tablet   docusate sodium (COLACE) 100 MG capsule   Dulaglutide 0.75 MG/0.5ML SOPN   gabapentin (NEURONTIN) 300 MG capsule   glucose blood (TRUE METRIX BLOOD GLUCOSE TEST) test strip   metFORMIN (GLUCOPHAGE) 500 MG tablet   methocarbamol (ROBAXIN) 500 MG tablet   naloxone  (NARCAN) nasal spray 4 mg/0.1 mL   ondansetron (ZOFRAN) 4 MG tablet   oxyCODONE-acetaminophen (PERCOCET) 5-325 MG tablet   sulfamethoxazole-trimethoprim (BACTRIM DS) 800-160 MG tablet   TRUEplus Lancets 28G MISC   valsartan-hydrochlorothiazide (DIOVAN-HCT) 160-12.5 MG tablet   Vitamin D, Ergocalciferol, (DRISDOL) 1.25 MG (50000 UNIT) CAPS capsule    Myra Gianotti, PA-C Surgical Short Stay/Anesthesiology [facility] Phone [phone] [facility] Phone [phone] 08/30/2021 12:44 PM

## 2021-08-30 NOTE — Progress Notes (Signed)
DUE TO COVID-19 ONLY ONE VISITOR IS ALLOWED TO COME WITH YOU AND STAY IN THE WAITING ROOM ONLY DURING PRE OP AND PROCEDURE DAY OF SURGERY.  Two  VISITORS  MAY VISIT WITH YOU AFTER SURGERY IN YOUR PRIVATE ROOM DURING VISITING HOURS ONLY!  PCP - Dr Shan Levans Cardiologist - n/a  Chest x-ray - 03/27/20 EKG - 03/27/20 Stress Test - n/a ECHO - n/a Cardiac Cath - n/a  ICD Pacemaker/Loop - n/a  Sleep Study -  n/a CPAP - none  Fasting Blood Sugar - unknown Checks Blood Sugar 0 times a day Do not take metformin on the morning of surgery.  Aspirin Instructions: Follow your surgeon's instructions on when to stop aspirin prior to surgery,  If no instructions were given by your surgeon then you will need to call the office for those instructions.  Clear liquids til 11:30 am on DOS.  Reviewed clear liquids with patient.  Anesthesia review: Yes  STOP now taking any Aspirin (unless otherwise instructed by your surgeon), Aleve, Naproxen, Ibuprofen, Motrin, Advil, Goody's, BC's, all herbal medications, fish oil, and all vitamins.   Coronavirus Screening Covid test is scheduled on DOS Do you have any of the following symptoms:  Cough yes/no: No Fever (>100.85F)  yes/no: No Runny nose yes/no: No Sore throat yes/no: No Difficulty breathing/shortness of breath  yes/no: No  Have you traveled in the last 14 days and where? yes/no: No  Patient verbalized understanding of instructions that were given via phone.

## 2021-09-01 NOTE — Telephone Encounter (Signed)
I spoke to this patient on Friday 9/16.  He stated he would be present for his drug screen and preop labs on Monday 9/19

## 2021-09-02 ENCOUNTER — Ambulatory Visit (HOSPITAL_COMMUNITY): Admission: RE | Admit: 2021-09-02 | Payer: Medicaid Other | Source: Home / Self Care | Admitting: Orthopaedic Surgery

## 2021-09-02 HISTORY — DX: Hyperlipidemia, unspecified: E78.5

## 2021-09-02 SURGERY — ARTHROPLASTY, HIP, TOTAL, ANTERIOR APPROACH
Anesthesia: Spinal | Site: Hip | Laterality: Left

## 2021-09-05 ENCOUNTER — Other Ambulatory Visit: Payer: Self-pay

## 2021-09-09 ENCOUNTER — Encounter: Payer: Self-pay | Admitting: *Deleted

## 2021-09-17 ENCOUNTER — Encounter: Payer: Medicaid Other | Admitting: Orthopaedic Surgery

## 2021-12-19 ENCOUNTER — Other Ambulatory Visit (HOSPITAL_COMMUNITY): Payer: Self-pay

## 2022-02-26 ENCOUNTER — Other Ambulatory Visit: Payer: Self-pay

## 2022-02-26 ENCOUNTER — Encounter (HOSPITAL_COMMUNITY): Payer: Self-pay | Admitting: Family

## 2022-02-26 ENCOUNTER — Emergency Department
Admission: EM | Admit: 2022-02-26 | Discharge: 2022-02-26 | Disposition: A | Discharge status: Home / Self Care | Payer: Medicaid Other | Attending: Physician Assistant | Admitting: Physician Assistant

## 2022-02-26 DIAGNOSIS — F1910 Other psychoactive substance abuse, uncomplicated: Secondary | ICD-10-CM

## 2022-02-26 DIAGNOSIS — Z9151 Personal history of suicidal behavior: Secondary | ICD-10-CM | POA: Insufficient documentation

## 2022-02-26 DIAGNOSIS — Z6828 Body mass index (BMI) 28.0-28.9, adult: Secondary | ICD-10-CM

## 2022-02-26 DIAGNOSIS — F1920 Other psychoactive substance dependence, uncomplicated: Secondary | ICD-10-CM | POA: Insufficient documentation

## 2022-02-26 DIAGNOSIS — F321 Major depressive disorder, single episode, moderate: Secondary | ICD-10-CM | POA: Insufficient documentation

## 2022-02-26 DIAGNOSIS — R45851 Suicidal ideations: Secondary | ICD-10-CM | POA: Insufficient documentation

## 2022-02-26 LAB — CBC WITH DIFF
BASOPHIL #: 0.1 10*3/uL (ref 0.00–2.50)
BASOPHIL %: 1 % (ref 0–3)
EOSINOPHIL #: 0.1 10*3/uL (ref 0.00–2.40)
EOSINOPHIL %: 1 % (ref 0–7)
HCT: 40.1 % — ABNORMAL LOW (ref 42.0–51.0)
HGB: 13.5 g/dL (ref 13.5–18.0)
LYMPHOCYTE #: 1 10*3/uL — ABNORMAL LOW (ref 2.10–11.00)
LYMPHOCYTE %: 10 % — ABNORMAL LOW (ref 25–45)
MCH: 29.6 pg (ref 27.0–32.0)
MCHC: 33.7 g/dL (ref 32.0–36.0)
MCV: 87.9 fL (ref 78.0–99.0)
MONOCYTE #: 0.7 10*3/uL (ref 0.00–4.10)
MONOCYTE %: 7 % (ref 0–12)
MPV: 7.6 fL (ref 7.4–10.4)
NEUTROPHIL #: 7.8 10*3/uL (ref 4.10–29.00)
NEUTROPHIL %: 80 % — ABNORMAL HIGH (ref 40–76)
PLATELETS: 218 10*3/uL (ref 140–440)
RBC: 4.56 10*6/uL (ref 4.20–6.00)
RDW: 14.4 % (ref 11.6–14.8)
WBC: 9.7 10*3/uL (ref 4.0–10.5)
WBCS UNCORRECTED: 9.7 10*3/uL

## 2022-02-26 LAB — URINE DRUG SCREEN
AMPHET QL: POSITIVE — AB
BARB QL: NEGATIVE
BENZO QL: NEGATIVE
BUP QL: POSITIVE — AB
CANNAQL: NEGATIVE
COCQL: NEGATIVE
METHQL: NEGATIVE
OPIATE: NEGATIVE
OXYCODONE URINE: NEGATIVE
PCP QL: NEGATIVE
TRICYCLIC ANTIDEPRESSANTS URINE: NEGATIVE

## 2022-02-26 LAB — ETHANOL, SERUM: ETHANOL: <10 mg/dL — ABNORMAL HIGH

## 2022-02-26 LAB — COMPREHENSIVE METABOLIC PANEL, NON-FASTING
ALBUMIN/GLOBULIN RATIO: 1.2 (ref 0.8–1.4)
ALBUMIN: 4 g/dL (ref 3.5–5.7)
ALKALINE PHOSPHATASE: 91 U/L (ref 34–104)
ALT (SGPT): 33 U/L (ref 7–52)
ANION GAP: 9 mmol/L — ABNORMAL LOW (ref 10–20)
AST (SGOT): 86 U/L — ABNORMAL HIGH (ref 13–39)
BILIRUBIN TOTAL: 0.7 mg/dL (ref 0.3–1.2)
BUN/CREA RATIO: 12 (ref 6–22)
BUN: 13 mg/dL (ref 7–25)
CALCIUM, CORRECTED: 9.1 mg/dL (ref 8.9–10.8)
CALCIUM: 9.1 mg/dL (ref 8.6–10.3)
CHLORIDE: 99 mmol/L (ref 98–107)
CO2 TOTAL: 24 mmol/L (ref 21–31)
CREATININE: 1.09 mg/dL (ref 0.60–1.30)
ESTIMATED GFR: 85 mL/min/{1.73_m2} (ref >59)
GLOBULIN: 3.3 (ref 2.9–5.4)
GLUCOSE: 137 mg/dL — ABNORMAL HIGH (ref 74–109)
OSMOLALITY, CALCULATED: 267 mOsm/kg — ABNORMAL LOW (ref 270–290)
POTASSIUM: 3.8 mmol/L (ref 3.5–5.1)
PROTEIN TOTAL: 7.3 g/dL (ref 6.4–8.9)
SODIUM: 132 mmol/L — ABNORMAL LOW (ref 136–145)

## 2022-02-26 LAB — URINALYSIS, MACRO/MICRO
BILIRUBIN: NEGATIVE mg/dL
BLOOD: NEGATIVE mg/dL
GLUCOSE: NEGATIVE mg/dL
KETONES: 20 mg/dL — AB
LEUKOCYTES: NEGATIVE WBCs/uL
NITRITE: NEGATIVE
PH: 6 (ref 5.0–9.0)
PROTEIN: 100 mg/dL — AB
SPECIFIC GRAVITY: 1.032 — ABNORMAL HIGH (ref 1.002–1.030)
UROBILINOGEN: NORMAL mg/dL

## 2022-02-26 LAB — URINALYSIS, MICROSCOPIC
RBCS: 8 /hpf — ABNORMAL HIGH (ref <4)
SQUAMOUS EPITHELIAL: <1 /hpf (ref <28)
WBCS: 5 /hpf (ref <6)

## 2022-02-26 LAB — COVID-19 ~~LOC~~ MOLECULAR LAB TESTING
INFLUENZA VIRUS TYPE A: NOT DETECTED
INFLUENZA VIRUS TYPE B: NOT DETECTED
RESPIRATORY SYNCTIAL VIRUS (RSV): NOT DETECTED
SARS-CoV-2: NOT DETECTED

## 2022-02-26 LAB — SALICYLATE ACID LEVEL: SALICYLATE LEVEL: <3 mg/dL (ref <=30)

## 2022-02-26 LAB — ACETAMINOPHEN LEVEL: ACETAMINOPHEN LEVEL: <10 ug/mL — ABNORMAL LOW (ref 10–30)

## 2022-02-26 MED ORDER — VALSARTAN 40 MG TABLET
160.0000 mg | ORAL_TABLET | Freq: Every day | ORAL | Status: DC
Start: 2022-02-26 — End: 2022-02-26

## 2022-02-26 MED ORDER — BUPRENORPHINE 8 MG-NALOXONE 2 MG SUBLINGUAL TABLET
SUBLINGUAL_TABLET | SUBLINGUAL | Status: AC
Start: 2022-02-26 — End: 2022-02-26
  Filled 2022-02-26: qty 1

## 2022-02-26 MED ORDER — HYDROCHLOROTHIAZIDE 25 MG TABLET
12.5000 mg | ORAL_TABLET | ORAL | Status: AC
Start: 2022-02-26 — End: 2022-02-26
  Administered 2022-02-26: 12.5 mg via ORAL

## 2022-02-26 MED ORDER — HYDROXYZINE PAMOATE 25 MG CAPSULE
ORAL_CAPSULE | ORAL | Status: AC
Start: 2022-02-26 — End: 2022-02-26
  Filled 2022-02-26: qty 2

## 2022-02-26 MED ORDER — HYDROXYZINE PAMOATE 25 MG CAPSULE
50.0000 mg | ORAL_CAPSULE | ORAL | Status: AC
Start: 2022-02-26 — End: 2022-02-26
  Administered 2022-02-26: 50 mg via ORAL

## 2022-02-26 MED ORDER — HYDROCHLOROTHIAZIDE 25 MG TABLET
ORAL_TABLET | ORAL | Status: AC
Start: 2022-02-26 — End: 2022-02-26
  Filled 2022-02-26: qty 1

## 2022-02-26 MED ORDER — BUPRENORPHINE 8 MG-NALOXONE 2 MG SUBLINGUAL TABLET
1.0000 | SUBLINGUAL_TABLET | Freq: Once | SUBLINGUAL | Status: AC
Start: 2022-02-26 — End: 2022-02-26
  Administered 2022-02-26: 1 via SUBLINGUAL

## 2022-02-26 MED ORDER — VALSARTAN 40 MG TABLET
160.0000 mg | ORAL_TABLET | Freq: Once | ORAL | Status: AC
Start: 2022-02-26 — End: 2022-02-26
  Administered 2022-02-26: 160 mg via ORAL
  Filled 2022-02-26: qty 4

## 2022-02-26 NOTE — ED Nurses Note (Signed)
PT IS HAS AGREED TO TAKE ORAL VISTARIL, AND He IS COOPERATIVE W/ME AT THIS TIME.Marland KitchenPT KEEPS SAYING THE He JUST WANTS His SUBOXIN, AND He SAYS He FEELS He SHOULD BE ENTITLED TO RECEIVING IT..the patient CONT TO PACE AROUND THE ROOM

## 2022-02-26 NOTE — ED Nurses Note (Signed)
PT IS COOPERATIVE AND NOW DENIES BEING SUICIDAL AT THIS TIME. He JUST SPOKE TO THE [facility], AND THEY WILL NOT TAKE HIM BECAUSE He SAYS He HAS PRESCRIPTION FOR SUBOXIN. I HAVE ASKED THE CHARGE NURSE TO MAKE REFERRAL TO CRISIS UNIT.

## 2022-02-26 NOTE — Care Management Notes (Signed)
Referral to [facility].  Pt denied d/t having out of state medicaid.  Cm spoke with pt who advised that he has been in [place] x 3 days.  Pt reports that he plans to remain in [place].  Explained to pt that he will need to close out his [place] mcd in order to get [place] mcd.  Pt voiced that he plans to go on line to  Close out his [place] mcd..  Pt voiced that he regrets making statements as he didn't  mean it. Pt said that he would prefer to go home.  Pt made aware that decision  To DC would be made by provider.  Cm called [facility] in [place] who advised that they would not be able to accept if he doesn't have active mcd number.

## 2022-02-26 NOTE — ED Nurses Note (Signed)
Committal hearing found no probable cause.  Pt discharged to follow up with community mental health services.

## 2022-02-26 NOTE — ED Provider Notes (Signed)
[facility]  Emergency Department  Course Note    Care/report received from Dr. Rayne Du att. providers found at  23:28.  Per report:  Juan George is a 46 y.o. male who had concerns including Suicidal Thoughts .  Pending mental hygiene hearing    Pending labs/imaging/consults:  Medically cleared pending mental hygiene hearing  Plan:  The patient underwent mental hygiene hearing had it was deemed competent agreed outpatient therapy.  Patient discharged    Course:   ED Course as of 02/26/22 2328   Wed Feb 26, 2022   2002 COVID-19 [facility] MOLECULAR LAB TESTING     Following the history, physical exam, and ED workup, the patient was deemed stable and suitable for discharge. The patient/caregiver was advised to return to the ED for any new or worsening symptoms. Discharge medications, and follow-up instructions were discussed with the patient/caregiver in detail, who verbalizes understanding. The patient/caregiver is in agreement and is comfortable with the plan of care.    Disposition: Psych Facility         Current Discharge Medication List      CONTINUE these medications - NO CHANGES were made during your visit.      Details   buprenorphine-naloxone 8-2 mg Film  Commonly known as: SUBOXONE   1 Film, Sublingual, 3 TIMES DAILY  Refills: 0     pregabalin 75 mg Capsule  Commonly known as: LYRICA   75 mg, Oral, 2 TIMES DAILY  Refills: 0     tiZANidine 4 mg Tablet  Commonly known as: ZANAFLEX   4-8 mg, Oral, NIGHTLY PRN  Refills: 0     Valsartan-Hydrochlorothiazide 160-12.5 mg Tablet   1 Tablet, Oral, DAILY  Refills: 0          Follow up:   No follow-up provider specified.    Clinical Impression   Moderate major depression (CMS HCC) (Primary)   Suicidal ideation   Drug abuse and dependence (CMS [place])         Geraldo Docker, PA-C  02/26/2022, 23:28

## 2022-02-26 NOTE — Discharge Instructions (Signed)
Follow-up with [facility] soon as possible.  Discontinue substance use.  If he have new or worsening symptoms return to the emergency department

## 2022-02-26 NOTE — ED Nurses Note (Signed)
Speaking with Crisis unit at this time

## 2022-02-26 NOTE — ED Triage Notes (Addendum)
SI plan increasing over the last three days with a plan to take a lot of pills, requesting to go the [facility]

## 2022-02-26 NOTE — ED Provider Notes (Signed)
[facility]  ED Primary Provider Note  History of Present Illness   Chief Complaint   Patient presents with   . Suicidal Thoughts      Arrival: The patient arrived by Private Vehicle    Juan George is a 46 y.o. male who had concerns including Suicidal Thoughts .  Patient presents to the emergency room for evaluation of depression with suicidal ideation and a plan to overdose times 3-4 days.  Patient states he  return from [place] 3-4 days ago and since being here he started using drugs again.  Patient states while in [place] he was doing well and on Suboxone.  He notes that since returning to [place] he has been using IV meth and heroin.  His last drug use was 02/25/2022.  Patient states he has attempted suicide in the past via overdose, he notes he is supposed to be on medications for psychiatric illness but is not on medication at this time.  He states his environment contributes to his signs and symptoms.  He denies auditory and visual hallucinations at this time.  He has no homicidal ideation.  Patient denies acute pain states he has chronic pain that he rates a 5/10.     Review of Systems   Constitutional: No fever, chills or weakness   Skin: No rash or diaphoresis  HENT: No headaches, or congestion  Eyes: No vision changes or photophobia   Cardio: No chest pain, palpitations or leg swelling   Respiratory: No cough, wheezing or SOB  GI:  No nausea, vomiting or stool changes  GU:  No dysuria, hematuria, or increased frequency  MSK: No muscle aches, joint or back pain  Neuro: No seizures, LOC, numbness, tingling, or focal weakness  Psychiatric: +depression, +SI +substance abuse  All other systems reviewed and are negative.    Historical Data   History Reviewed This Encounter:  Reviewed    Physical Exam   ED Triage Vitals [02/26/22 0400]   BP (Non-Invasive) (!) 165/109   Heart Rate (!) 125   Respiratory Rate 18   Temperature 37.3 C (99.1 F)   SpO2 97 %   Weight  104 kg (230 lb)   Height 1.905 m (6' 3" )       Constitutional:  46 y.o. male who appears in no distress. Normal color, no cyanosis.   HENT:   Head: Normocephalic and atraumatic.   Mouth/Throat: Oropharynx is clear and moist.   Eyes: EOMI, PERRL   Neck: Trachea midline. Neck supple.  Cardiovascular: RRR, No murmurs, rubs or gallops. Intact distal pulses.  S1, S2.  Pulmonary/Chest:  Respirations even and nonlabored BS equal bilaterally. No respiratory distress. No wheezes, rales or chest tenderness.   Abdominal: Bowel sounds present and normal. Abdomen soft, no tenderness, no rebound and no guarding.  Musculoskeletal: No edema, tenderness or deformity.  Skin: warm and dry. No rash, erythema, pallor or cyanosis.  Marks noted to the right [facility] consistent with IVDA  Psychiatric:  Anxious mood and affect.  Suicidal ideation with planned overdose.  No homicidal ideation.  No obvious hallucinations.   Neurological: Patient keenly alert and responsive, easily able to raise eyebrows, facial muscles/expressions symmetric, speaking in fluent sentences, moving all extremities equally and fully, normal gait  Patient Data   Reviewed  Labs Ordered/Reviewed   ACETAMINOPHEN LEVEL - Abnormal; Notable for the following components:       Result Value    ACETAMINOPHEN LEVEL <10 (*)  All other components within normal limits   COMPREHENSIVE METABOLIC PANEL, NON-FASTING - Abnormal; Notable for the following components:    SODIUM 132 (*)     ANION GAP 9 (*)     GLUCOSE 137 (*)     AST (SGOT) 86 (*)     OSMOLALITY, CALCULATED 267 (*)     All other components within normal limits    Narrative:     Estimated Glomerular Filtration Rate (eGFR) is calculated using the CKD-EPI (2021) equation, intended for patients 26 years of age and older. If gender is not documented or "unknown", there will be no eGFR calculation.   ETHANOL, SERUM - Abnormal; Notable for the following components:    ETHANOL <10 (*)     All other components within normal limits    URINE DRUG SCREEN - Abnormal; Notable for the following components:    AMPHET QL Positive (*)     BUP QL Positive (*)     All other components within normal limits    Narrative:     Urine Culture Recommended   CBC WITH DIFF - Abnormal; Notable for the following components:    HCT 40.1 (*)     NEUTROPHIL % 80 (*)     LYMPHOCYTE % 10 (*)     LYMPHOCYTE # 1.00 (*)     All other components within normal limits   URINALYSIS, MACRO/MICRO - Abnormal; Notable for the following components:    SPECIFIC GRAVITY 1.032 (*)     PROTEIN 100 (*)     KETONES 20 (*)     All other components within normal limits    Narrative:     Urine Culture Recommended   URINALYSIS, MICROSCOPIC - Abnormal; Notable for the following components:    BACTERIA Rare (*)     MUCOUS Many (*)     SPERMATOZOA URINE Few (*)     CALCIUM OXALATE CRYSTALS Moderate (*)     BUDDING YEAST Rare (*)     RBCS 8 (*)     All other components within normal limits    Narrative:     Urine Culture Recommended   SALICYLATE ACID LEVEL - Normal   CBC/DIFF    Narrative:     The following orders were created for panel order CBC/DIFF.  Procedure                               Abnormality         Status                     ---------                               -----------         ------                     CBC WITH RUEA[[id number]]                Abnormal            Final result                 Please view results for these tests on the individual orders.   COVID-19 [place] MOLECULAR LAB TESTING    Narrative:     Results are for the simultaneous qualitative identification of SARS-CoV-2 (formerly 2019-nCoV),  Influenza A, Influenza B, and RSV RNA. These etiologic agents are generally detectable in nasopharyngeal and nasal swabs during the ACUTE PHASE of infection. Hence, this test is intended to be performed on respiratory specimens collected from individuals with signs and symptoms of upper respiratory tract infection who meet Centers for Disease Control and Prevention (CDC)  clinical and/or epidemiological criteria for Coronavirus Disease 2019 (COVID-19) testing. CDC COVID-19 criteria for testing on human specimens is available at [facility] webpage information for Healthcare Professionals: Coronavirus Disease 2019 (COVID-19) ([email or web address]).     False-negative results may occur if the virus has genomic mutations, insertions, deletions, or rearrangements or if performed very early in the course of illness. Otherwise, negative results indicate virus specific RNA targets are not detected, however negative results do not preclude SARS-CoV-2 infection/COVID-19, Influenza, or Respiratory syncytial virus infection. Results should not be used as the sole basis for patient management decisions. Negative results must be combined with clinical observations, patient history, and epidemiological information. If upper respiratory tract infection is still suspected based on exposure history together with other clinical findings, re-testing should be considered.    Disclaimer:   This assay has been authorized by FDA under an Emergency Use Authorization for use in laboratories certified under the Clinical Laboratory Improvement Amendments of 1988 (CLIA), 42 U.S.C. ([phone], to perform high complexity tests. The impacts of vaccines, antiviral therapeutics, antibiotics, chemotherapeutic or immunosuppressant drugs have not been evaluated.     Test methodology:   Cepheid Xpert Xpress SARS-CoV-2/Flu/RSV Assay real-time polymerase chain reaction (RT-PCR) test on the GeneXpert Dx and Xpert Xpress systems.   URINALYSIS WITH REFLEX MICROSCOPIC AND CULTURE IF POSITIVE    Narrative:     The following orders were created for panel order URINALYSIS WITH REFLEX MICROSCOPIC AND CULTURE IF POSITIVE.  Procedure                               Abnormality         Status                     ---------                               -----------         ------                     URINALYSIS,  MACRO/MICRO[[id number]]      Abnormal            Final result               URINALYSIS, MICROSCOPIC[[id number]]      Abnormal            Final result                 Please view results for these tests on the individual orders.     No orders to display     Medical Decision Making        Medical Decision Making  Attempted to for patient multiple facilities.  He refused to go to the [facility] due to they were not providing with his Suboxone.  Several other facilities was attempted by social services and he was on due to patient's insurance.  I did go in and attempt to explain to the patient the mental hygiene process and the why we were doing that.  The patient states he needs his medication, he states he has been here all day and has not received a dose of his medications.  Patient notified that currently we are attempting to find a facility to take him due to his complaints of suicidal ideation.  He begins screaming are you denying me my medication.  Explained to him that I was not denying him anything and we are  just explaining to him what our process is and what we are doing. Pt continues to yell. I ended the examination at that time. Pt continued to yell and behave aggressive. Police were called by security. Pt did calm down after they arrived. Mental hygiene forms completed. [facility] did request I complete the first page; however, I notified them the patient was not being cooperative at present. Notified charge nurse who states she will complete the first page. She was also notified to send the provider note and labs.     Report given to Merrilee Seashore, night shift provider. Pt care transferred.    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 Decision-making details documented in ED Course.  Radiology: ordered. Decision-making details documented in ED Course.      Risk  Prescription drug  management.  Parenteral controlled substances.        ED Course as of 02/26/22 1800   Wed Feb 26, 2022   0657 Reviewed, awaiting urine for disposition   [id number] Awaiting urine for  dispo   0937 Pt is medically clear for psychiatric treatment. Results reviewed.    67 Pt is medically clear for psychiatric treatment.          Medications Administered in the ED   hydrOXYzine pamoate (VISTARIL) capsule (50 mg Oral Given 02/26/22 1722)     Clinical Impression   Moderate major depression (CMS HCC) (Primary)   Suicidal ideation   Drug abuse and dependence (CMS [place])       Disposition: [place]

## 2022-02-26 NOTE — ED Nurses Note (Signed)
PT HAS SPOKEN TO ED PROVIDER AND SPOKEN ABOUT THE COMMITAL PROCESS, AND PT ASKING ABOUT His HOME MEDS, INCLUDING SUBOXIN, AND He GETS UPSET AND STARTED HOLLERING AND CUSSING,,,,,SECURITY HAS BEEN CALLED AND IS AT THE BEDSIDE.Marland Kitchenper ED PROVIDER, PT CAN NOT LEAVE, AS She HAS FILLED THE COMMITAL PAPERS.

## 2022-02-26 NOTE — ED Nurses Note (Signed)
Referral made to [facility]

## 2022-02-27 ENCOUNTER — Emergency Department (HOSPITAL_BASED_OUTPATIENT_CLINIC_OR_DEPARTMENT_OTHER): Payer: Medicaid Other

## 2022-02-27 ENCOUNTER — Emergency Department (EMERGENCY_DEPARTMENT_HOSPITAL): Payer: Medicaid Other

## 2022-02-27 ENCOUNTER — Encounter (HOSPITAL_BASED_OUTPATIENT_CLINIC_OR_DEPARTMENT_OTHER): Payer: Self-pay

## 2022-02-27 ENCOUNTER — Other Ambulatory Visit: Payer: Self-pay

## 2022-02-27 ENCOUNTER — Emergency Department
Admission: EM | Admit: 2022-02-27 | Discharge: 2022-02-27 | Payer: Medicaid Other | Attending: FAMILY PRACTICE | Admitting: FAMILY PRACTICE

## 2022-02-27 DIAGNOSIS — I10 Essential (primary) hypertension: Secondary | ICD-10-CM | POA: Insufficient documentation

## 2022-02-27 DIAGNOSIS — Z01818 Encounter for other preprocedural examination: Secondary | ICD-10-CM

## 2022-02-27 DIAGNOSIS — Z008 Encounter for other general examination: Secondary | ICD-10-CM

## 2022-02-27 DIAGNOSIS — Z20822 Contact with and (suspected) exposure to covid-19: Secondary | ICD-10-CM | POA: Insufficient documentation

## 2022-02-27 DIAGNOSIS — Z8619 Personal history of other infectious and parasitic diseases: Secondary | ICD-10-CM | POA: Insufficient documentation

## 2022-02-27 DIAGNOSIS — F1510 Other stimulant abuse, uncomplicated: Secondary | ICD-10-CM

## 2022-02-27 DIAGNOSIS — R000 Tachycardia, unspecified: Secondary | ICD-10-CM | POA: Insufficient documentation

## 2022-02-27 DIAGNOSIS — E119 Type 2 diabetes mellitus without complications: Secondary | ICD-10-CM | POA: Insufficient documentation

## 2022-02-27 DIAGNOSIS — Z9114 Patient's other noncompliance with medication regimen: Secondary | ICD-10-CM | POA: Insufficient documentation

## 2022-02-27 DIAGNOSIS — Z0289 Encounter for other administrative examinations: Secondary | ICD-10-CM | POA: Insufficient documentation

## 2022-02-27 DIAGNOSIS — Z96642 Presence of left artificial hip joint: Secondary | ICD-10-CM | POA: Insufficient documentation

## 2022-02-27 DIAGNOSIS — F1590 Other stimulant use, unspecified, uncomplicated: Secondary | ICD-10-CM | POA: Insufficient documentation

## 2022-02-27 DIAGNOSIS — F17210 Nicotine dependence, cigarettes, uncomplicated: Secondary | ICD-10-CM | POA: Insufficient documentation

## 2022-02-27 DIAGNOSIS — Z28310 Unvaccinated for covid-19: Secondary | ICD-10-CM | POA: Insufficient documentation

## 2022-02-27 LAB — CBC WITH DIFF
BASOPHIL #: 0.04 10*3/uL (ref 0.00–2.50)
BASOPHIL %: 0 % (ref 0–3)
EOSINOPHIL #: 0.22 10*3/uL (ref 0.00–2.40)
EOSINOPHIL %: 2 % (ref 0–7)
HCT: 34.9 % — ABNORMAL LOW (ref 42.0–51.0)
HGB: 12.2 g/dL — ABNORMAL LOW (ref 13.5–18.0)
LYMPHOCYTE #: 1.28 10*3/uL — ABNORMAL LOW (ref 2.10–11.00)
LYMPHOCYTE %: 12 % — ABNORMAL LOW (ref 25–45)
MCH: 30 pg (ref 27.0–32.0)
MCHC: 35 g/dL (ref 32.0–36.0)
MCV: 86 fL (ref 78.0–99.0)
MONOCYTE #: 1.01 10*3/uL (ref 0.00–4.10)
MONOCYTE %: 9 % (ref 0–12)
MPV: 7.1 fL — ABNORMAL LOW (ref 7.4–10.4)
NEUTROPHIL #: 8.46 10*3/uL (ref 4.10–29.00)
NEUTROPHIL %: 77 % — ABNORMAL HIGH (ref 40–76)
PLATELETS: 255 10*3/uL (ref 140–440)
RBC: 4.06 10*6/uL — ABNORMAL LOW (ref 4.20–6.00)
RDW: 17.3 % — ABNORMAL HIGH (ref 11.6–14.8)
WBC: 11 10*3/uL — ABNORMAL HIGH (ref 4.0–10.5)

## 2022-02-27 LAB — URINALYSIS, MACRO/MICRO
BILIRUBIN: NEGATIVE mg/dL
BLOOD: NEGATIVE mg/dL
GLUCOSE: NEGATIVE mg/dL
KETONES: NEGATIVE mg/dL
LEUKOCYTES: NEGATIVE WBCs/uL
NITRITE: NEGATIVE
PH: 6 (ref 4.6–8.0)
PROTEIN: NEGATIVE mg/dL
SPECIFIC GRAVITY: 1.01 (ref 1.003–1.035)
UROBILINOGEN: 0.2 mg/dL (ref 0.2–1.0)

## 2022-02-27 LAB — URINE DRUG SCREEN
AMPHETAMINES URINE: POSITIVE — AB
BARBITURATES URINE: NEGATIVE
BENZODIAZEPINES URINE: NEGATIVE
CANNABINOIDS URINE: NEGATIVE
COCAINE METABOLITES URINE: NEGATIVE
METHADONE URINE: NEGATIVE
OPIATES URINE: NEGATIVE
PCP URINE: NEGATIVE

## 2022-02-27 LAB — COMPREHENSIVE METABOLIC PANEL, NON-FASTING
ALBUMIN/GLOBULIN RATIO: 1 (ref 0.8–1.4)
ALBUMIN: 3.5 g/dL (ref 3.4–5.0)
ALKALINE PHOSPHATASE: 101 U/L (ref 46–116)
ALT (SGPT): 48 U/L (ref <=78)
ANION GAP: 10 mmol/L (ref 10–20)
AST (SGOT): 92 U/L — ABNORMAL HIGH (ref 15–37)
BILIRUBIN TOTAL: 0.9 mg/dL (ref 0.2–1.0)
BUN/CREA RATIO: 11
BUN: 13 mg/dL (ref 7–18)
CALCIUM, CORRECTED: 9.1 mg/dL
CALCIUM: 8.6 mg/dL (ref 8.5–10.1)
CHLORIDE: 98 mmol/L (ref 98–107)
CO2 TOTAL: 24 mmol/L (ref 21–32)
CREATININE: 1.21 mg/dL (ref 0.70–1.30)
ESTIMATED GFR: 75 mL/min/{1.73_m2} (ref >59)
GLOBULIN: 3.5
GLUCOSE: 171 mg/dL — ABNORMAL HIGH (ref 74–106)
OSMOLALITY, CALCULATED: 269 mOsm/kg — ABNORMAL LOW (ref 270–290)
POTASSIUM: 3.7 mmol/L (ref 3.5–5.1)
PROTEIN TOTAL: 7 g/dL (ref 6.4–8.2)
SODIUM: 132 mmol/L — ABNORMAL LOW (ref 136–145)

## 2022-02-27 LAB — ECG 12 LEAD
Atrial Rate: 121 {beats}/min
QT Interval: 336 ms
Ventricular rate: 121 {beats}/min

## 2022-02-27 LAB — MAGNESIUM: MAGNESIUM: 1.4 mg/dL — ABNORMAL LOW (ref 1.8–2.4)

## 2022-02-27 LAB — COVID-19, FLU A/B, RSV RAPID BY PCR
INFLUENZA VIRUS TYPE A: NOT DETECTED
INFLUENZA VIRUS TYPE B: NOT DETECTED
RESPIRATORY SYNCTIAL VIRUS (RSV): NOT DETECTED
SARS-CoV-2: NOT DETECTED

## 2022-02-27 LAB — LACTIC ACID LEVEL W/ REFLEX FOR LEVEL >2.0: LACTIC ACID: 1.1 mmol/L (ref 0.4–2.0)

## 2022-02-27 LAB — ETHANOL, SERUM: ETHANOL: <3 mg/dL — ABNORMAL LOW (ref 3–202)

## 2022-02-27 LAB — TROPONIN-I: TROPONIN I: 5 ng/L (ref <20)

## 2022-02-27 LAB — SALICYLATE ACID LEVEL: SALICYLATE LEVEL: 2 mg/dL — ABNORMAL LOW (ref 3–20)

## 2022-02-27 LAB — ACETAMINOPHEN LEVEL: ACETAMINOPHEN LEVEL: 0 ug/mL

## 2022-02-27 MED ORDER — MAGNESIUM SULFATE 1 GRAM/100 ML IN DEXTROSE 5 % INTRAVENOUS PIGGYBACK
1.0000 g | INJECTION | Freq: Once | INTRAVENOUS | Status: AC
Start: 2022-02-27 — End: 2022-02-27
  Administered 2022-02-27: 1 g via INTRAVENOUS
  Administered 2022-02-27: 0 g via INTRAVENOUS

## 2022-02-27 MED ORDER — MAGNESIUM SULFATE 1 GRAM/100 ML IN DEXTROSE 5 % INTRAVENOUS PIGGYBACK
INJECTION | INTRAVENOUS | Status: AC
Start: 2022-02-27 — End: 2022-02-27
  Filled 2022-02-27: qty 200

## 2022-02-27 MED ORDER — MAGNESIUM SULFATE 1 GRAM/100 ML IN DEXTROSE 5 % INTRAVENOUS PIGGYBACK
1.0000 g | INJECTION | Freq: Once | INTRAVENOUS | Status: AC
Start: 2022-02-27 — End: 2022-02-27
  Administered 2022-02-27: 0 g via INTRAVENOUS
  Administered 2022-02-27: 1 g via INTRAVENOUS

## 2022-02-27 NOTE — ED Triage Notes (Addendum)
States he shot and smoked some meth a few days ago and wants to make sure he is ok, says he has not slept much since then, denies symptoms. Needs medically cleared for jail per police

## 2022-02-27 NOTE — ED Provider Notes (Signed)
[facility] Emergency Department  ED Primary Provider Note  History of Present Illness   Chief Complaint   Patient presents with   . Medical Screening     Juan George is a 46 y.o. male who had concerns including Medical Screening.  Arrival: The patient arrived by Police    This 46 year old male patient presents emergency department for law enforcement medical clearance before jail.  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  He last used methamphetamines 2 days ago, denies any alcohol, or marijuana use.  He says he is a diabetic, and LE states he also has hypertension and isn't taking his medications for HTN. He has lost weight and has been on metformin in the past but also isn't taking it any longer, had left hip arthroplasty in January and when he was evaluated at that point he says hemoglobin A1c was "good" but did not know the level.  He he continues to smoke about a pack of cigarettes per day.  He denies headaches, body aches joint aches beyond the norm, cough, wheeze, chest tightness, dyspnea, nausea, vomiting, diarrhea.    He is not Covid vaccinated.             Review of Systems   Pertinent positive and negative ROS as per HPI.  Historical Data   History Reviewed This Encounter: Medical History  Surgical History  Family History  Social History      Physical Exam   ED Triage Vitals [02/27/22 0827]   BP (Non-Invasive) (!) 139/93   Heart Rate (!) 130   Respiratory Rate (!) 24   Temperature 37.7 C (99.8 F)   SpO2 96 %   Weight    Height      Physical Exam   General: No acute distress, nontoxic, flight of thoughts.  Neck:  Supple without carotid bruits, JVD, accessory muscle use to breathe, lymphadenopathy.  Lungs:  Clear symmetrical, slightly distant   Heart:  Regular rate rhythm S1-S2 without murmur or gallop   Abdomen:  Soft normal bowel sounds nontender   Extremities: Moving  symmetrically, ambulatory into the ER with fluid gait   Skin:  No suspicious rashes or lesions, does have scars consistent with his IV drug use past.  No dependent edema   Neurological:  No focal deficits      Patient Data     Labs Ordered/Reviewed   COMPREHENSIVE METABOLIC PANEL, NON-FASTING - Abnormal; Notable for the following components:       Result Value    SODIUM 132 (*)     GLUCOSE 171 (*)     AST (SGOT) 92 (*)     OSMOLALITY, CALCULATED 269 (*)     All other components within normal limits    Narrative:     Estimated Glomerular Filtration Rate (eGFR) is calculated using the CKD-EPI (2021) equation, intended for patients 18 years of age and older. If gender is not documented or "unknown", there will be no eGFR calculation.   ETHANOL, SERUM - Abnormal; Notable for the following components:    ETHANOL <3 (*)     All other components within normal limits   URINE DRUG SCREEN - Abnormal; Notable for the following components:    AMPHETAMINES URINE Positive (*)     All other components within normal limits   MAGNESIUM - Abnormal; Notable for the following components:    MAGNESIUM 1.4 (*)  All other components within normal limits   SALICYLATE ACID LEVEL - Abnormal; Notable for the following components:    SALICYLATE LEVEL 2 (*)     All other components within normal limits   CBC WITH DIFF - Abnormal; Notable for the following components:    WBC 11.0 (*)     RBC 4.06 (*)     HGB 12.2 (*)     HCT 34.9 (*)     RDW 17.3 (*)     MPV 7.1 (*)     NEUTROPHIL % 77 (*)     LYMPHOCYTE % 12 (*)     LYMPHOCYTE # 1.28 (*)     All other components within normal limits   TROPONIN-I - Normal    Narrative:     Values received on females ranging between 12-15 ng/L MUST include the next serial troponin to review changes in the delta differences as the reference range for the Access II chemistry analyzer is lower than the established reference range.     URINALYSIS, MACRO/MICRO - Normal   COVID-19, FLU A/B, RSV RAPID BY PCR - Normal     Narrative:     Results are for the simultaneous qualitative identification of SARS-CoV-2 (formerly 2019-nCoV), Influenza A, Influenza B, and RSV RNA. These etiologic agents are generally detectable in nasopharyngeal and nasal swabs during the ACUTE PHASE of infection. Hence, this test is intended to be performed on respiratory specimens collected from individuals with signs and symptoms of upper respiratory tract infection who meet Centers for Disease Control and Prevention (CDC) clinical and/or epidemiological criteria for Coronavirus Disease 2019 (COVID-19) testing. CDC COVID-19 criteria for testing on human specimens is available at [facility] webpage information for Healthcare Professionals: Coronavirus Disease 2019 (COVID-19) ([email or web address][employer][email or web address]).     False-negative results may occur if the virus has genomic mutations, insertions, deletions, or rearrangements or if performed very early in the course of illness. Otherwise, negative results indicate virus specific RNA targets are not detected, however negative results do not preclude SARS-CoV-2 infection/COVID-19, Influenza, or Respiratory syncytial virus infection. Results should not be used as the sole basis for patient management decisions. Negative results must be combined with clinical observations, patient history, and epidemiological information. If upper respiratory tract infection is still suspected based on exposure history together with other clinical findings, re-testing should be considered.    Disclaimer:   This assay has been authorized by FDA under an Emergency Use Authorization for use in laboratories certified under the Clinical Laboratory Improvement Amendments of 1988 (CLIA), 42 U.S.C. ([phone], to perform high complexity tests. The impacts of vaccines, antiviral therapeutics, antibiotics, chemotherapeutic or immunosuppressant drugs have not been evaluated.     Test methodology:   Cepheid Xpert Xpress  SARS-CoV-2/Flu/RSV Assay real-time polymerase chain reaction (RT-PCR) test on the GeneXpert Dx and Xpert Xpress systems.   LACTIC ACID LEVEL W/ REFLEX FOR LEVEL >2.0 - Normal   ADULT ROUTINE BLOOD CULTURE, SET OF 2 BOTTLES (BACTERIA AND YEAST)   ADULT ROUTINE BLOOD CULTURE, SET OF 2 BOTTLES (BACTERIA AND YEAST)   CBC/DIFF    Narrative:     The following orders were created for panel order CBC/DIFF.  Procedure                               Abnormality         Status                     ---------                               -----------         ------  CBC WITH DEYC[[id number]]                Abnormal            Final result                 Please view results for these tests on the individual orders.   URINALYSIS WITH REFLEX MICROSCOPIC AND CULTURE IF POSITIVE    Narrative:     The following orders were created for panel order URINALYSIS WITH REFLEX MICROSCOPIC AND CULTURE IF POSITIVE.  Procedure                               Abnormality         Status                     ---------                               -----------         ------                     URINALYSIS, MACRO/MICRO[[id number]]      Normal              Final result                 Please view results for these tests on the individual orders.   ACETAMINOPHEN LEVEL   COMPREHENSIVE METABOLIC PANEL, NON-FASTING   TROPONIN-I     XR AP MOBILE CHEST   Final Result by Edi, Radresults In (03/16 1109)   Portable upright chest. No acute cardiopulmonary changes seen.         Radiologist location ID: [place] Decision Making        MDM  ED Course as of 02/27/22 1248   Thu Feb 27, 2022   1245 Patient is medically cleared for discharge.         Medications Administered in the ED   magnesium sulfate 1 G in D5W 100 mL premix IVPB (1 g Intravenous New Bag/New Syringe 02/27/22 1239)   magnesium sulfate 1 G in D5W 100 mL premix IVPB (0 g Intravenous Stopped 02/27/22 1237)     Clinical Impression   Medical clearance for incarceration  (Primary)   Hypomagnesemia   Methamphetamine use (CMS HCC)       Disposition: Discharged    .Marland KitchenBretta Bang, DO  02/27/2022, 12:49

## 2022-02-27 NOTE — Ancillary Notes (Signed)
EKG completed

## 2022-02-28 LAB — ECG 12 LEAD
Calculated P Axis: 52 degrees
Calculated R Axis: 56 degrees
Calculated T Axis: 41 degrees
PR Interval: 144 ms
QRS Duration: 86 ms
QTC Calculation: 477 ms

## 2022-03-01 LAB — ADULT ROUTINE BLOOD CULTURE, SET OF 2 BOTTLES (BACTERIA AND YEAST): BLOOD CULTURE, ROUTINE: NO GROWTH

## 2022-03-02 LAB — ADULT ROUTINE BLOOD CULTURE, SET OF 2 BOTTLES (BACTERIA AND YEAST): BLOOD CULTURE, ROUTINE: NO GROWTH

## 2022-04-24 ENCOUNTER — Other Ambulatory Visit: Payer: Self-pay

## 2022-04-24 NOTE — ED Triage Notes (Signed)
Pt did meth approximately 2 hours ago, PD got called to [employer] and he told them he wanted to come to ER to get checked and go for rehab. States he got kicked out of rehab 3 days ago. Denise HI or SI 20g saline lock right antec by EMS

## 2022-04-25 ENCOUNTER — Encounter (HOSPITAL_BASED_OUTPATIENT_CLINIC_OR_DEPARTMENT_OTHER): Payer: Self-pay

## 2022-04-25 ENCOUNTER — Emergency Department
Admission: EM | Admit: 2022-04-25 | Discharge: 2022-04-25 | Disposition: A | Discharge status: Other Acute Inpatient Hospital | Payer: Medicaid Other | Attending: Emergency Medicine | Admitting: Emergency Medicine

## 2022-04-25 DIAGNOSIS — Z6830 Body mass index (BMI) 30.0-30.9, adult: Secondary | ICD-10-CM

## 2022-04-25 DIAGNOSIS — F1510 Other stimulant abuse, uncomplicated: Secondary | ICD-10-CM | POA: Insufficient documentation

## 2022-04-25 DIAGNOSIS — F1910 Other psychoactive substance abuse, uncomplicated: Secondary | ICD-10-CM | POA: Insufficient documentation

## 2022-04-25 LAB — CBC WITH DIFF
BASOPHIL #: 0.03 10*3/uL (ref 0.00–0.30)
BASOPHIL %: 0 % (ref 0–3)
EOSINOPHIL #: 0.08 10*3/uL (ref 0.00–0.80)
EOSINOPHIL %: 1 % (ref 0–7)
HCT: 40.9 % — ABNORMAL LOW (ref 42.0–51.0)
HGB: 13.3 g/dL — ABNORMAL LOW (ref 13.5–18.0)
LYMPHOCYTE #: 1.51 10*3/uL (ref 1.10–5.00)
LYMPHOCYTE %: 21 % — ABNORMAL LOW (ref 25–45)
MCH: 27.5 pg (ref 27.0–32.0)
MCHC: 32.4 g/dL (ref 32.0–36.0)
MCV: 84.9 fL (ref 78.0–99.0)
MONOCYTE #: 0.56 10*3/uL (ref 0.00–1.30)
MONOCYTE %: 8 % (ref 0–12)
MPV: 7.5 fL (ref 7.4–10.4)
NEUTROPHIL #: 4.89 10*3/uL (ref 1.80–8.40)
NEUTROPHIL %: 69 % (ref 40–76)
PLATELETS: 207 10*3/uL (ref 140–440)
RBC: 4.82 10*6/uL (ref 4.20–6.00)
RDW: 17.3 % — ABNORMAL HIGH (ref 11.6–14.8)
WBC: 7.1 10*3/uL (ref 4.0–10.5)

## 2022-04-25 LAB — BASIC METABOLIC PANEL
ANION GAP: 11 mmol/L (ref 10–20)
BUN/CREA RATIO: 13
BUN: 14 mg/dL (ref 7–18)
CALCIUM: 8.7 mg/dL (ref 8.5–10.1)
CHLORIDE: 99 mmol/L (ref 98–107)
CO2 TOTAL: 24 mmol/L (ref 21–32)
CREATININE: 1.07 mg/dL (ref 0.70–1.30)
ESTIMATED GFR: 87 mL/min/{1.73_m2} (ref >59)
GLUCOSE: 157 mg/dL — ABNORMAL HIGH (ref 74–106)
OSMOLALITY, CALCULATED: 272 mOsm/kg (ref 270–290)
POTASSIUM: 4.1 mmol/L (ref 3.5–5.1)
SODIUM: 134 mmol/L — ABNORMAL LOW (ref 136–145)

## 2022-04-25 LAB — URINE DRUG SCREEN
AMPHETAMINES URINE: POSITIVE — AB
BARBITURATES URINE: NEGATIVE
BENZODIAZEPINES URINE: NEGATIVE
CANNABINOIDS URINE: NEGATIVE
COCAINE METABOLITES URINE: POSITIVE — AB
METHADONE URINE: NEGATIVE
OPIATES URINE: NEGATIVE
PCP URINE: NEGATIVE

## 2022-04-25 LAB — URINALYSIS, MACRO/MICRO
BILIRUBIN: NEGATIVE mg/dL
BLOOD: NEGATIVE mg/dL
GLUCOSE: 250 mg/dL — AB
KETONES: NEGATIVE mg/dL
LEUKOCYTES: NEGATIVE WBCs/uL
NITRITE: NEGATIVE
PH: 6 (ref 4.6–8.0)
PROTEIN: 30 mg/dL — AB
SPECIFIC GRAVITY: 1.025 (ref 1.003–1.035)
UROBILINOGEN: 0.2 mg/dL (ref 0.2–1.0)

## 2022-04-25 LAB — ACETAMINOPHEN LEVEL: ACETAMINOPHEN LEVEL: 0

## 2022-04-25 LAB — COVID-19, FLU A/B, RSV RAPID BY PCR
INFLUENZA VIRUS TYPE A: NOT DETECTED
INFLUENZA VIRUS TYPE B: NOT DETECTED
RESPIRATORY SYNCTIAL VIRUS (RSV): NOT DETECTED
SARS-CoV-2: NOT DETECTED

## 2022-04-25 LAB — ETHANOL, SERUM: ETHANOL: <3 mg/dL (ref <=3)

## 2022-04-25 LAB — URINALYSIS, MICROSCOPIC

## 2022-04-25 LAB — THYROID STIMULATING HORMONE (SENSITIVE TSH): TSH: 4.267 u[IU]/mL — ABNORMAL HIGH (ref 0.358–3.740)

## 2022-04-25 LAB — SALICYLATE ACID LEVEL: SALICYLATE LEVEL: 3 mg/dL (ref 3–20)

## 2022-04-25 NOTE — ED Nurses Note (Signed)
Pt laying in bed continuing to sing to himself and hold nonsensical two sided conversations with himself.

## 2022-04-25 NOTE — ED Nurses Note (Signed)
Pt is laying in bed singing to himself and holding a conversation with his left hand. Pt is talking to the hand and answering it back in different voices.

## 2022-04-25 NOTE — ED Nurses Note (Signed)
Pt continues to sing and talk to himself in different voices while laying in his bed.

## 2022-04-25 NOTE — ED Nurses Note (Signed)
Report called to Lurena Joiner at [facility]. Patient transferred by [facility]. No problems noted as leaving

## 2022-04-25 NOTE — ED Attending Note (Addendum)
[facility] emergency department         HISTORY OF PRESENT ILLNESS     Date:  04/25/2022  Patient's Name:  Juan George  Date of Birth:  02/08/1976    Patient with history of methamphetamine abuse.  Requesting placement for polysubstance abuse.  Patient recently discharged from inpatient psychiatry/drug rehab center.  The patient states he is having visual and auditory hallucinations probably related to methamphetamine abuse.  Patient denies having suicidal or homicidal thoughts.          Review of Systems     Review of Systems   Constitutional: Negative.    HENT: Negative.    Respiratory: Negative.    Cardiovascular: Negative.    Gastrointestinal: Negative.    Genitourinary: Negative.    Neurological: Negative.    Hematological: Negative.    Psychiatric/Behavioral: Positive for hallucinations.   All other systems reviewed and are negative.      Previous History     Past Medical History:  Past Medical History:   Diagnosis Date   . Anemia     Vitamin b 12 defecient   . Anxiety    . Decorative tattoo     several over body   . GERD (gastroesophageal reflux disease)     Well controlled with medication   . Gout    . Hypertension    . Obesity    . Piercing     ears   . Tobacco abuse    . Type 2 diabetes mellitus     blood glucoses are 110-120 mg/dl   . Viral hepatitis C     Tests negative, but still on medications   . Wound healing, delayed        Past Surgical History:  Past Surgical History:   Procedure Laterality Date   . Hx other     . Hx tonsillectomy     . Hx upper endoscopy     . Pb ear cartilage graft to face N/A 02/15/2013   . Pb repair nasal septum perforatn N/A 02/15/2013       Social History:  Social History     Tobacco Use   . Smoking status: Every Day     Packs/day: 0.50     Years: 20.00     Pack years: 10.00     Types: Cigarettes   . Smokeless tobacco: Never   Substance Use Topics   . Alcohol use: No   . Drug use: Yes     Types: Amphetamine     Social History      Substance and Sexual Activity   Drug Use Yes   . Types: Amphetamine       Family History:  Family History   Problem Relation Age of Onset   . Anesthesia Complications Neg Hx    . Bleeding Disorders Neg Hx    . Cancer Neg Hx    . Diabetes Neg Hx    . Heart Disease Neg Hx    . Hypertension (High Blood Pressure) Neg Hx    . Other Neg Hx        Medication History:  Current Outpatient Medications   Medication Sig   . buprenorphine-naloxone (SUBOXONE) 8-2 mg Sublingual Film Place 1 Film under the tongue Three times a day   . pregabalin (LYRICA) 75 mg Oral Capsule Take 1 Capsule (75 mg total) by mouth Twice daily   . tiZANidine (ZANAFLEX) 4 mg Oral Tablet Take 1-2  Tablets (4-8 mg total) by mouth Every night as needed   . Valsartan-Hydrochlorothiazide 160-12.5 mg Oral Tablet Take 1 Tablet by mouth Once a day       Allergies:  Allergies   Allergen Reactions   . Tramadol      Seizure       Physical Exam     Vitals:    BP 129/85   Pulse 77   Temp 36.2 C (97.1 F)   Resp 16   Ht 1.905 m (6\' 3" )   Wt 109 kg (240 lb)   SpO2 99%   BMI 30.00 kg/m           Physical Exam  Vitals and nursing note reviewed.   Constitutional:       General: He is not in acute distress.     Appearance: He is well-developed.   HENT:      Head: Normocephalic and atraumatic.   Eyes:      Conjunctiva/sclera: Conjunctivae normal.   Cardiovascular:      Rate and Rhythm: Normal rate and regular rhythm.      Heart sounds: No murmur heard.  Pulmonary:      Effort: Pulmonary effort is normal. No respiratory distress.      Breath sounds: Normal breath sounds.   Abdominal:      Palpations: Abdomen is soft.      Tenderness: There is no abdominal tenderness.   Musculoskeletal:         General: No swelling.      Cervical back: Neck supple.   Skin:     General: Skin is warm and dry.      Capillary Refill: Capillary refill takes less than 2 seconds.   Neurological:      Mental Status: He is alert.   Psychiatric:         Mood and Affect: Mood normal.          Diagnostic Studies/Treatment     Medications:       Discharge Medication List as of 04/25/2022  8:16 AM          Labs:    No results found for this or any previous visit (from the past 12 hour(s)).     Radiology:  None    No orders to display       ECG:  NONE            Differential diagnosis  Methamphetamine abuse, polysubstance abuse    Course/Disposition/Plan     Course:  The patient accepted for transfer to [facility]      Stable  Disposition:    Transfered to Another Facility    Condition at Disposition:      Stable  Follow up:   No follow-up provider specified.    Clinical Impression:     Clinical Impression   Methamphetamine abuse (CMS HCC) (Primary)   Polysubstance abuse (CMS HCC)         [facility], MD

## 2022-12-02 ENCOUNTER — Other Ambulatory Visit: Payer: Self-pay

## 2023-06-29 IMAGING — CT CT HEAD W/O CM
4 series · 16 of 47 positions shown, 18 images · non-contrast
Comparison: None.

CLINICAL DATA: Status post fall.

EXAM:
CT HEAD WITHOUT CONTRAST
TECHNIQUE: Contiguous axial images were obtained from the base of the skull
through the vertex without intravenous contrast.

[Series 3: head wo · axial · 0.44mm/px · z∈[+1190,+1310]mm · 7 of 33 slices shown, 9 images]
[im 5/33  brain]
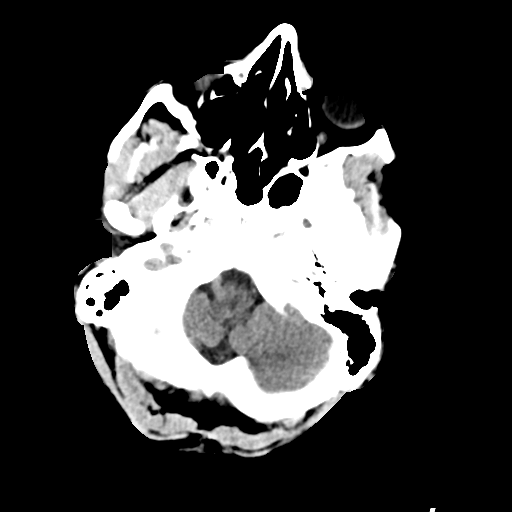
[im 5/33  bone]
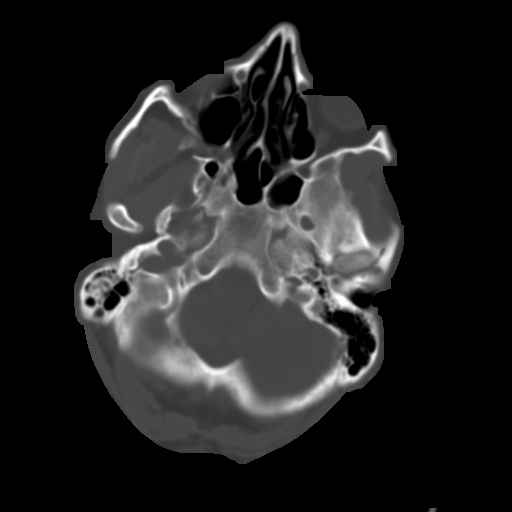
[im 9/33  brain]
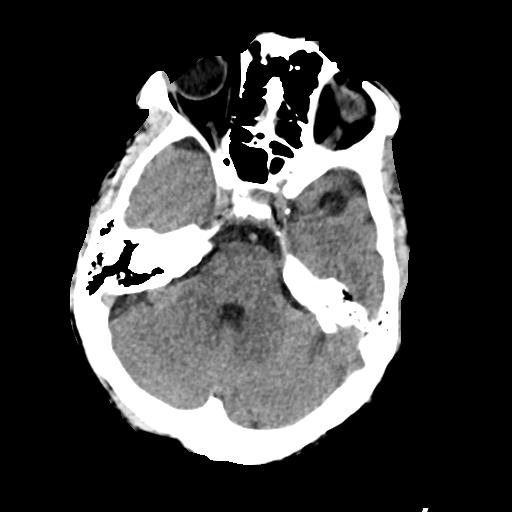
[im 13/33  brain]
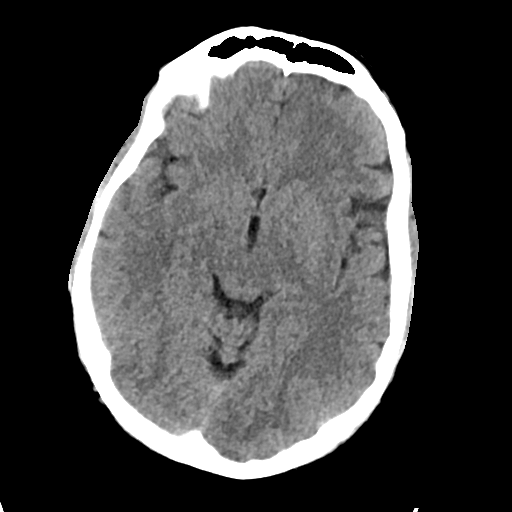
[im 17/33  brain]
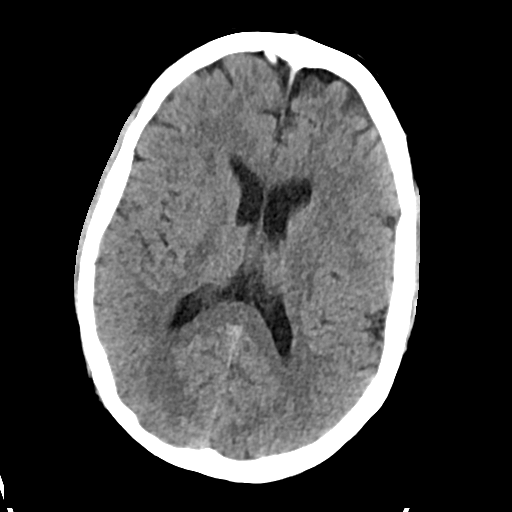
[im 21/33  brain]
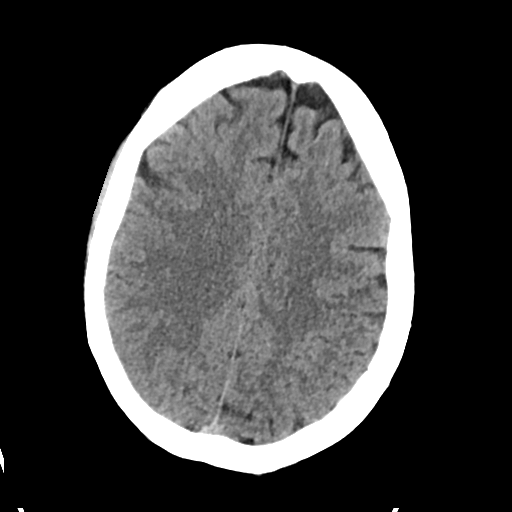
[im 21/33  bone]
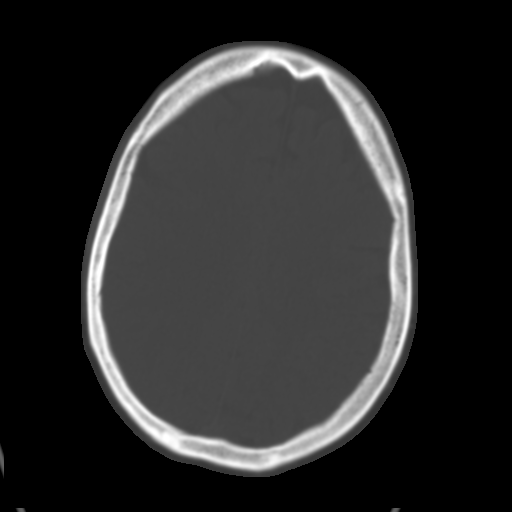
[im 25/33  brain]
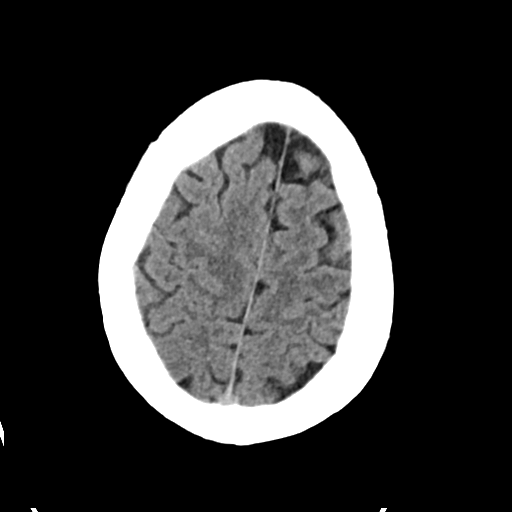
[im 29/33  brain]
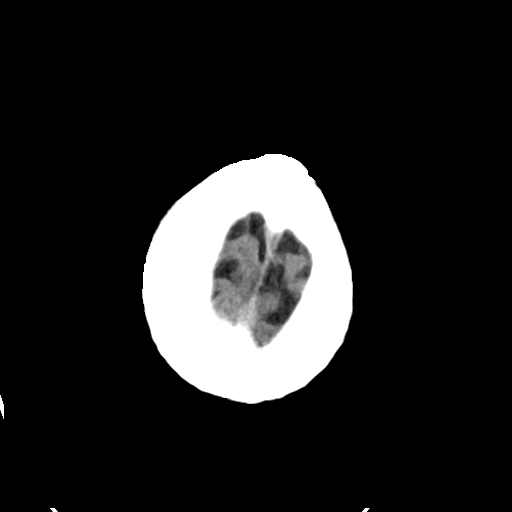

[Series 4: head bone · axial · 0.44mm/px · z∈[+1186,+1218]mm · 3 of 83 slices shown]
[im 9/83  bone]
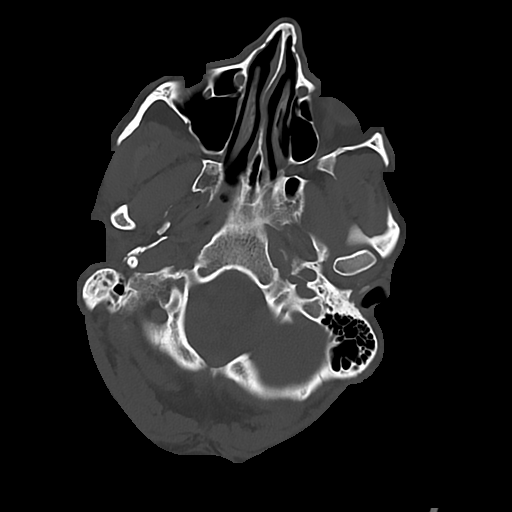
[im 17/83  bone]
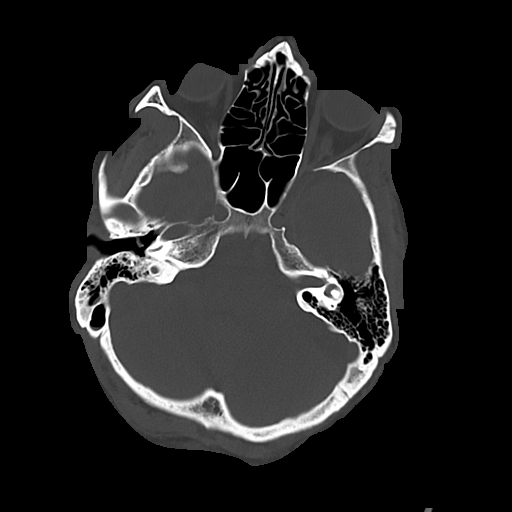
[im 25/83  bone]
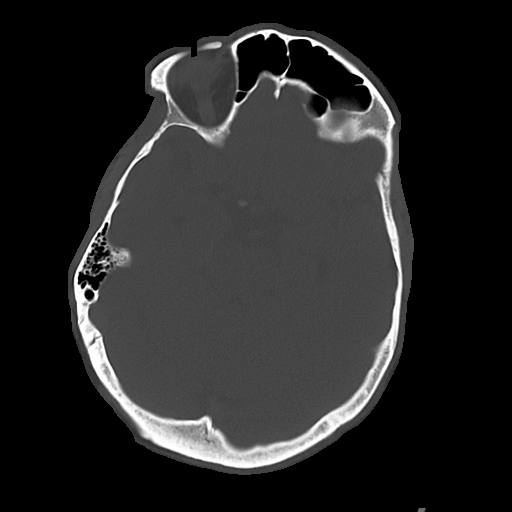

[Series 5: cor soft · coronal · 0.31mm/px · 3 of 79 slices shown]
[im 27/79  brain]
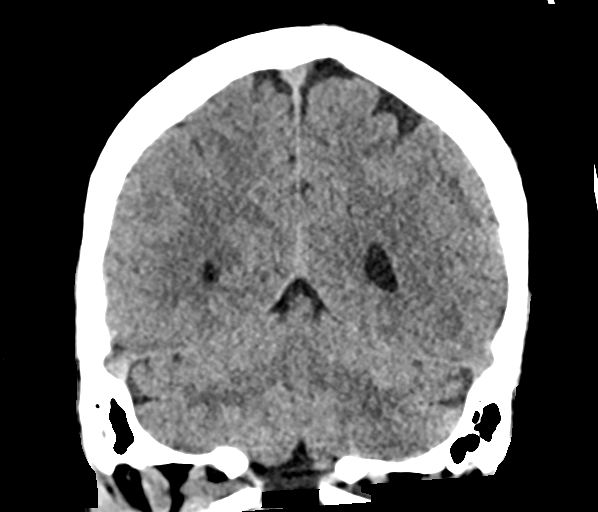
[im 35/79  brain]
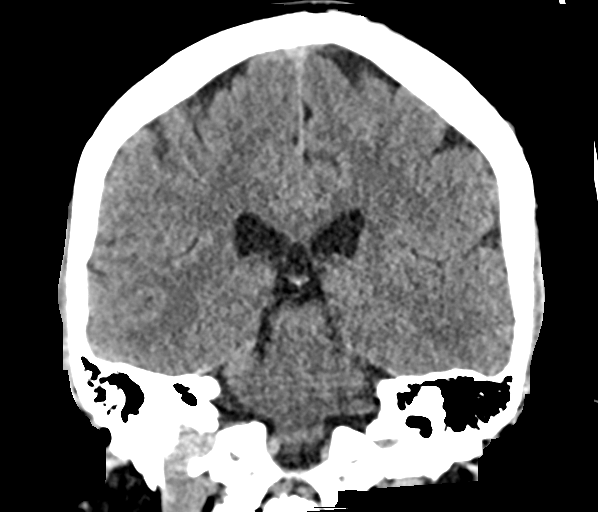
[im 44/79  brain]
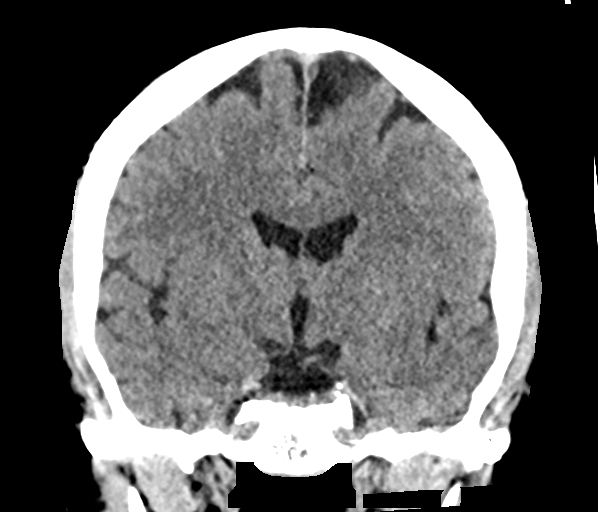

[Series 6: sag soft · sagittal · 0.31mm/px · 3 of 63 slices shown]
[im 21/63  brain]
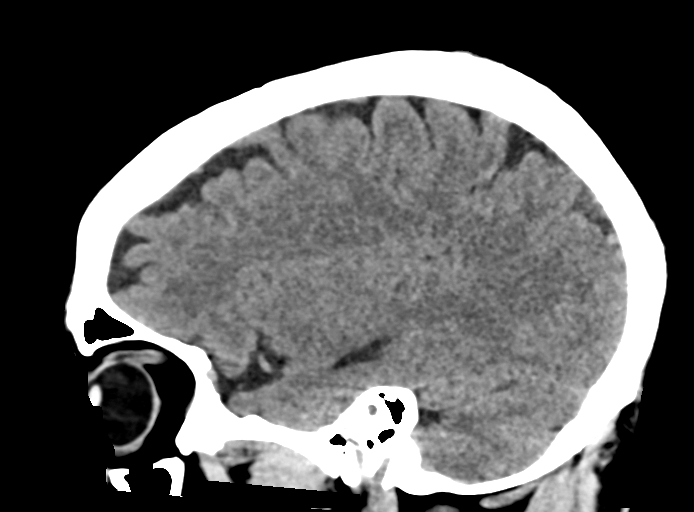
[im 32/63  brain]
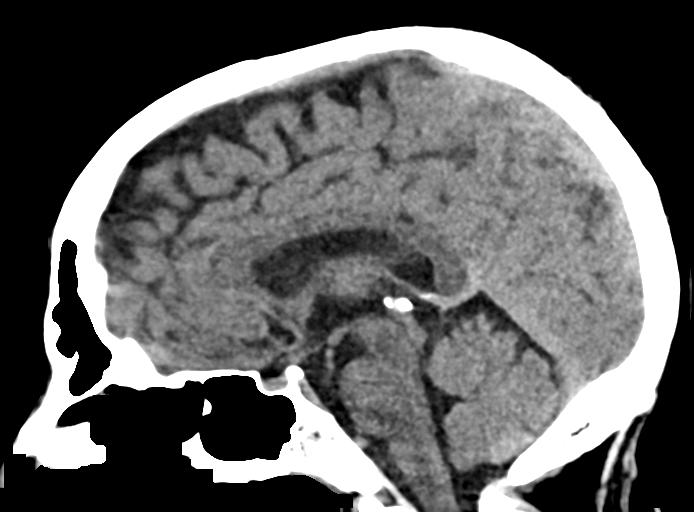
[im 42/63  brain]
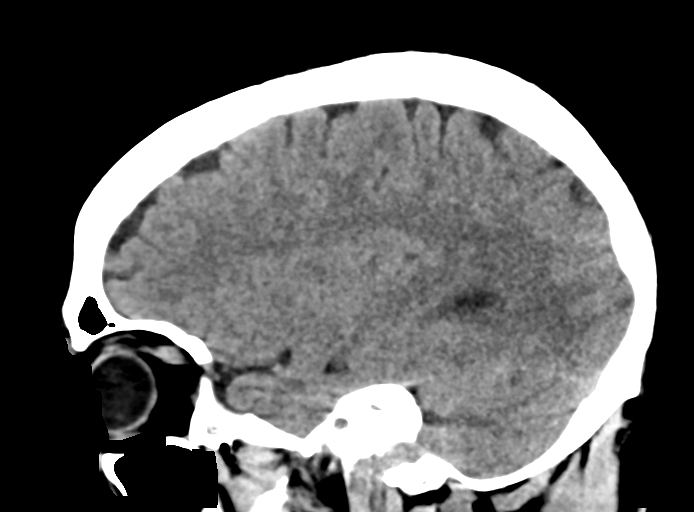

[16 of 47 positions shown; findings below may reference images not displayed]

FINDINGS: Brain: No evidence of acute infarction, hemorrhage, hydrocephalus,
extra-axial collection or mass lesion/mass effect.

Vascular: No hyperdense vessel or unexpected calcification.

Skull: Normal. Negative for fracture or focal lesion.

Sinuses/Orbits: No acute finding.

Other: None.
IMPRESSION: No acute intracranial abnormality.

## 2024-11-09 ENCOUNTER — Other Ambulatory Visit (HOSPITAL_COMMUNITY): Payer: Self-pay
